# Patient Record
Sex: Female | Born: 1945 | Race: White | Hispanic: No | Marital: Married | Smoking: Never smoker
Health system: Southern US, Community
[De-identification: ages and names within clinical notes are randomized; demographics above are authoritative.]

## PROBLEM LIST (undated history)

## (undated) DIAGNOSIS — C4491 Basal cell carcinoma of skin, unspecified: Secondary | ICD-10-CM

## (undated) DIAGNOSIS — IMO0002 Reserved for concepts with insufficient information to code with codable children: Secondary | ICD-10-CM

## (undated) DIAGNOSIS — I10 Essential (primary) hypertension: Secondary | ICD-10-CM

## (undated) DIAGNOSIS — M8580 Other specified disorders of bone density and structure, unspecified site: Secondary | ICD-10-CM

## (undated) DIAGNOSIS — F32A Depression, unspecified: Secondary | ICD-10-CM

## (undated) DIAGNOSIS — M1990 Unspecified osteoarthritis, unspecified site: Secondary | ICD-10-CM

## (undated) DIAGNOSIS — F329 Major depressive disorder, single episode, unspecified: Secondary | ICD-10-CM

## (undated) HISTORY — DX: Unspecified osteoarthritis, unspecified site: M19.90

## (undated) HISTORY — DX: Depression, unspecified: F32.A

## (undated) HISTORY — DX: Basal cell carcinoma of skin, unspecified: C44.91

## (undated) HISTORY — PX: BREAST BIOPSY: SHX20

## (undated) HISTORY — DX: Other specified disorders of bone density and structure, unspecified site: M85.80

## (undated) HISTORY — DX: Major depressive disorder, single episode, unspecified: F32.9

## (undated) HISTORY — PX: URETHRAL DILATION: SUR417

## (undated) HISTORY — PX: ABDOMINAL HYSTERECTOMY: SHX81

## (undated) HISTORY — DX: Reserved for concepts with insufficient information to code with codable children: IMO0002

## (undated) HISTORY — PX: OTHER SURGICAL HISTORY: SHX169

---

## 1998-04-22 ENCOUNTER — Other Ambulatory Visit: Admission: RE | Admit: 1998-04-22 | Discharge: 1998-04-22 | Payer: Self-pay | Admitting: *Deleted

## 1999-07-01 ENCOUNTER — Encounter: Admission: RE | Admit: 1999-07-01 | Discharge: 1999-07-01 | Payer: Self-pay | Admitting: Internal Medicine

## 1999-07-01 ENCOUNTER — Encounter: Payer: Self-pay | Admitting: Internal Medicine

## 2001-02-23 ENCOUNTER — Other Ambulatory Visit: Admission: RE | Admit: 2001-02-23 | Discharge: 2001-02-23 | Payer: Self-pay | Admitting: *Deleted

## 2002-02-28 HISTORY — PX: OTHER SURGICAL HISTORY: SHX169

## 2002-11-14 ENCOUNTER — Ambulatory Visit (HOSPITAL_BASED_OUTPATIENT_CLINIC_OR_DEPARTMENT_OTHER): Admission: RE | Admit: 2002-11-14 | Discharge: 2002-11-15 | Payer: Self-pay | Admitting: Orthopedic Surgery

## 2006-05-26 ENCOUNTER — Other Ambulatory Visit: Admission: RE | Admit: 2006-05-26 | Discharge: 2006-05-26 | Payer: Self-pay | Admitting: *Deleted

## 2007-11-23 ENCOUNTER — Encounter: Admission: RE | Admit: 2007-11-23 | Discharge: 2007-11-23 | Payer: Self-pay | Admitting: Family Medicine

## 2008-04-22 ENCOUNTER — Encounter: Admission: RE | Admit: 2008-04-22 | Discharge: 2008-04-22 | Payer: Self-pay | Admitting: Family Medicine

## 2008-05-26 ENCOUNTER — Other Ambulatory Visit: Admission: RE | Admit: 2008-05-26 | Discharge: 2008-05-26 | Payer: Self-pay | Admitting: Family Medicine

## 2008-05-26 ENCOUNTER — Encounter: Payer: Self-pay | Admitting: Internal Medicine

## 2009-07-28 ENCOUNTER — Ambulatory Visit: Payer: Self-pay | Admitting: Internal Medicine

## 2009-07-28 DIAGNOSIS — F329 Major depressive disorder, single episode, unspecified: Secondary | ICD-10-CM | POA: Insufficient documentation

## 2009-07-28 DIAGNOSIS — M549 Dorsalgia, unspecified: Secondary | ICD-10-CM | POA: Insufficient documentation

## 2009-07-28 DIAGNOSIS — M810 Age-related osteoporosis without current pathological fracture: Secondary | ICD-10-CM

## 2009-07-28 DIAGNOSIS — R319 Hematuria, unspecified: Secondary | ICD-10-CM | POA: Insufficient documentation

## 2009-07-28 LAB — CONVERTED CEMR LAB
ALT: 13 units/L (ref 0–35)
AST: 17 units/L (ref 0–37)
Alkaline Phosphatase: 67 units/L (ref 39–117)
BUN: 20 mg/dL (ref 6–23)
Basophils Absolute: 0 10*3/uL (ref 0.0–0.1)
Basophils Relative: 0.5 % (ref 0.0–3.0)
Bilirubin, Direct: 0.1 mg/dL (ref 0.0–0.3)
Cholesterol: 199 mg/dL (ref 0–200)
Eosinophils Absolute: 0.1 10*3/uL (ref 0.0–0.7)
GFR calc non Af Amer: 94.06 mL/min (ref >60)
MCHC: 34 g/dL (ref 30.0–36.0)
MCV: 94.3 fL (ref 78.0–100.0)
Monocytes Absolute: 0.5 10*3/uL (ref 0.1–1.0)
Neutrophils Relative %: 55.7 % (ref 43.0–77.0)
Platelets: 256 10*3/uL (ref 150.0–400.0)
Potassium: 4.1 meq/L (ref 3.5–5.1)
RBC: 3.99 M/uL (ref 3.87–5.11)
RDW: 14 % (ref 11.5–14.6)
Rhuematoid fact SerPl-aCnc: 26.1 intl units/mL — ABNORMAL HIGH (ref 0.0–20.0)
Sed Rate: 15 mm/hr (ref 0–22)
Total Bilirubin: 0.6 mg/dL (ref 0.3–1.2)
VLDL: 12.6 mg/dL (ref 0.0–40.0)

## 2009-07-29 ENCOUNTER — Encounter: Payer: Self-pay | Admitting: Internal Medicine

## 2009-07-29 LAB — CONVERTED CEMR LAB
Calcium, Total (PTH): 9.2 mg/dL (ref 8.4–10.5)
PTH: 13 pg/mL — ABNORMAL LOW (ref 14.0–72.0)

## 2009-08-02 DIAGNOSIS — R32 Unspecified urinary incontinence: Secondary | ICD-10-CM | POA: Insufficient documentation

## 2009-08-10 ENCOUNTER — Ambulatory Visit: Payer: Self-pay | Admitting: Internal Medicine

## 2009-08-10 ENCOUNTER — Encounter: Payer: Self-pay | Admitting: Internal Medicine

## 2009-09-04 ENCOUNTER — Encounter: Admission: RE | Admit: 2009-09-04 | Discharge: 2009-09-04 | Payer: Self-pay | Admitting: Internal Medicine

## 2009-09-08 ENCOUNTER — Ambulatory Visit: Payer: Self-pay | Admitting: Internal Medicine

## 2009-09-08 DIAGNOSIS — R030 Elevated blood-pressure reading, without diagnosis of hypertension: Secondary | ICD-10-CM

## 2009-09-10 ENCOUNTER — Encounter: Admission: RE | Admit: 2009-09-10 | Discharge: 2009-09-10 | Payer: Self-pay | Admitting: Internal Medicine

## 2009-09-22 ENCOUNTER — Encounter: Payer: Self-pay | Admitting: Internal Medicine

## 2009-12-08 ENCOUNTER — Ambulatory Visit: Payer: Self-pay | Admitting: Internal Medicine

## 2009-12-08 DIAGNOSIS — M1990 Unspecified osteoarthritis, unspecified site: Secondary | ICD-10-CM | POA: Insufficient documentation

## 2010-01-14 ENCOUNTER — Ambulatory Visit: Payer: Self-pay | Admitting: Family Medicine

## 2010-01-14 ENCOUNTER — Encounter: Payer: Self-pay | Admitting: Family Medicine

## 2010-03-30 NOTE — Assessment & Plan Note (Signed)
Summary: anxiety/dm   Vital Signs:  Patient profile:   65 year old female Menstrual status:  hysterectomy Weight:      134 pounds Temp:     97.4 degrees F oral BP sitting:   156 / 80  (left arm) Cuff size:   regular  Vitals Entered By: Sid Falcon LPN (January 14, 2010 11:59 AM)  History of Present Illness: Patient seen as a work in with episode last night of sensation of heart fluttering. Her pulse was 96. She initially did not have any chest discomfort, more a sensation of not being able to get a deep breath. She felt very anxious. She got up to walk and her symptoms improved. She did not have any chest tightness or any real chest pain. Patient subsequently fell asleep. After about 3 hours she woke and had similar recurrent symptoms. Again no chest pain and sensation of chest fluttering very briefly.  Used some of her husbands viscous lidocaine for mouth ulcer and wondered if this may have precipitated (fluttering sensation).  Patient states she had similar episodes in the past and has had prior EKG which was unremarkable. She does drink about 5 cups of caffeinated beverage per day. Walks for exercise with no problems TSH last May normal.  She is nonsmoker.  No FH of premature CAD,  No diabetes hx.  Good HDL.  Dealing with multiple stressors with her husband's diagnosis of cancer and some other family stressors.  Allergies: 1)  ! Penicillin 2)  ! * Ivp Dye  Past History:  Past Medical History: Last updated: 12/08/2009 Depression-in the past cystocele  Idiopathic hematuria . G3 P4 twins  Urinary incontinence Basal cell ca  nose Osteopenia  dexa 2009 ,-2.3   osteoporosis  -2.5 hip 2011 Colonscopy 2007 nl  PAP 2010 nl Osteoarthritis  Past Surgical History: Last updated: 09-10-2009 Bladder tack  urethral dilitation Rt Shoulder Surgery  [facility]  2004 Hysterectomy 1982  fibroids  and cyst right ovary  left oopherectomy Basal cell Ca Lomax  Family History: Last  updated: 09/10/09 Father: Deceased from Bladder cancer-age 43 Mother: Deceased- Major depressive disorder- pulmonary embolism Siblings: 4 Brothers- Oldest- Adult onset- DM, high cholesterol, Brother- Rheumatoid Arthritis, Twin brothers- one has DJD in spine; other- healthy  Mom  fractured hip in 23s and meds    Social History: Last updated: Sep 10, 2009 Retired  Scientist, [employer]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  NHL in remission Never Smoked Alcohol use-yes Drug use-no Regular exercise-yes hh of 2    no pets.  Multiple myeloma and non hogkins  lymphoma  in husband .  x 6 years .     Risk Factors: Alcohol Use: <1 (12/08/2009) Caffeine Use: 2 (12/08/2009) Exercise: yes (12/08/2009)  Risk Factors: Smoking Status: never (12/08/2009)  Review of Systems  The patient denies anorexia, fever, weight loss, hoarseness, chest pain, syncope, dyspnea on exertion, peripheral edema, prolonged cough, headaches, hemoptysis, abdominal pain, melena, hematochezia, and severe indigestion/heartburn.    Physical Exam  General:  Well-developed,well-nourished,in no acute distress; alert,appropriate and cooperative throughout examination Ears:  External ear exam shows no significant lesions or deformities.  Otoscopic examination reveals clear canals, tympanic membranes are intact bilaterally without bulging, retraction, inflammation or discharge. Hearing is grossly normal bilaterally. Mouth:  patient has small aphthous ulcer right posterior hard palate area. Otherwise normal Neck:  No deformities, masses, or tenderness noted. Lungs:  Normal  respiratory effort, chest expands symmetrically. Lungs are clear to auscultation, no crackles or wheezes. Heart:  normal rate and regular rhythm.   Cervical Nodes:  No lymphadenopathy noted   Impression & Recommendations:  Problem # 1:  PALPITATIONS (ICD-785.1)  patient had transient symptoms of  air hunger which are probably anxiety related. Obtain EKG. Gradually reduce caffeine use. EKG NSR with no acute changes.  Orders: EKG w/ Interpretation (93000)  Problem # 2:  APHTHOUS ULCERS (ICD-528.2) treat with salt water gargles.  Problem # 3:  ELEVATED BP READING WITHOUT DX HYPERTENSION (ICD-796.2) She is encouraged to follow up with Dr Fabian Sharp regarding this.  Complete Medication List: 1)  Calcium 600+d 600-400 Mg-unit Tabs (Calcium carbonate-vitamin d) 2)  Alendronate Sodium 70 Mg Tabs (Alendronate sodium) .Marland Kitchen.. 1 by mouth q week  Patient Instructions: 1)  Gradually reduce caffeine use. 2)  Avoid further viscous lidocaine use 3)  Follow up promptly if you have any recurrent chest tightness, especially with exercise   Orders Added: 1)  Est. Patient Level IV [25366] 2)  EKG w/ Interpretation [93000]

## 2010-03-30 NOTE — Consult Note (Signed)
Summary: [facility]   Imported By: Maryln Gottron 10/02/2009 12:52:34  _____________________________________________________________________  External Attachment:    Type:   Image     Comment:   External Document

## 2010-03-30 NOTE — Letter (Signed)
Summary: Additional Records from [facility]  Additional Records from [facility]   Imported By: Maryln Gottron 10/07/2009 10:01:48  _____________________________________________________________________  External Attachment:    Type:   Image     Comment:   External Document

## 2010-03-30 NOTE — Miscellaneous (Signed)
Summary: BONE DENSITY  Clinical Lists Changes  Orders: Added new Test order of T-Bone Densitometry (77080) - Signed Added new Test order of T-Lumbar Vertebral Assessment (77082) - Signed 

## 2010-03-30 NOTE — Assessment & Plan Note (Signed)
Summary: F/U POST RHEUMATOLOGY OV // RS   Vital Signs:  Patient profile:   65 year old female Menstrual status:  hysterectomy Weight:      135 pounds Pulse rate:   88 / minute BP sitting:   156 / 80  (left arm) Cuff size:   regular  Vitals Entered By: Romualdo Bolk, CMA (AAMA) (December 08, 2009 2:17 PM) CC: 1)Pt wants to discuss rheumatology appt. Dr. Dareen Piano recommending fosamax and naproxen. 2)Left ear is pops and cracks. Then in am she has to wiggle it to get it to open.   History of Present Illness: Nicole Wood comes in today for follow-up after her visit to the rheumatologist. She was evaluated in felt to have changes of osteoarthritis in not in inflammatory arthritis. However it was recommended she should consider a Fosamax type medication because of her bone density in a -2.5 range.  She does have hesitations with these medications. She has no personal history of fracture is physically active however her mom did have a history of fracture but had many other risk factors.   She also has a problem with waxen her left ear today difficult to hear and crackling there is no pain or fever..  Preventive Screening-Counseling & Management  Alcohol-Tobacco     Alcohol drinks/day: <1     Alcohol type: wine     Smoking Status: never  Caffeine-Diet-Exercise     Caffeine use/day: 2     Does Patient Exercise: yes     Type of exercise: walking, bike     Exercise (avg: min/session): 30-60     Times/week: 3  Current Medications (verified): 1)  Calcium 600+d 600-400 Mg-Unit Tabs (Calcium Carbonate-Vitamin D)  Allergies (verified): 1)  ! Penicillin 2)  ! * Ivp Dye  Past History:  Past medical, surgical, family and social histories (including risk factors) reviewed for relevance to current acute and chronic problems.  Past Medical History: Depression-in the past cystocele  Idiopathic hematuria . G3 P4 twins  Urinary incontinence Basal cell ca  nose Osteopenia  dexa 2009  ,-2.3   osteoporosis  -2.5 hip 2011 Colonscopy 2007 nl  PAP 2010 nl Osteoarthritis  Past Surgical History: Reviewed history from 09/08/2009 and no changes required. Bladder tack  urethral dilitation Rt Shoulder Surgery  [facility]  2004 Hysterectomy 1982  fibroids  and cyst right ovary  left oopherectomy Basal cell Ca Lomax  Past History:  Care Management: Urology: Mosetta Pigeon  Prev pcp Daphine Deutscher , [facility] Rheumatology: Dareen Piano  Family History: Reviewed history from 09/08/2009 and no changes required. Father: Deceased from Bladder cancer-age 53 Mother: Deceased- Major depressive disorder- pulmonary embolism Siblings: 38 Brothers- Oldest- Adult onset- DM, high cholesterol, Brother- Rheumatoid Arthritis, Twin brothers- one has DJD in spine; other- healthy  Mom  fractured hip in 40s and meds    Social History: Reviewed history from 09/08/2009 and no changes required. 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  NHL in remission Never Smoked Alcohol use-yes Drug use-no Regular exercise-yes hh of 2    no pets.  Multiple myeloma and non hogkins  lymphoma  in husband .  x 6 years .     Review of Systems  The patient denies anorexia, fever, weight loss, weight gain, vision loss, hoarseness, chest pain, prolonged cough, abdominal pain, difficulty walking, abnormal bleeding, enlarged lymph nodes, and angioedema.  Physical Exam  General:  Well-developed,well-nourished,in no acute distress; alert,appropriate and cooperative throughout examination Head:  normocephalic and atraumatic.   Eyes:  vision grossly intact.   Ears:  R ear normal.  left eac  full of wax   no redness after irrigation TM intact and normal Nose:  no external deformity and no nasal discharge.   Mouth:  pharynx pink and moist.   Neck:  No deformities, masses, or tenderness noted. Psych:  Oriented X3, good eye contact, not anxious  appearing, and not depressed appearing.   reviewed office note from Dr. Dareen Piano and his report on her bone density.  Impression & Recommendations:  Problem # 1:  OSTEOPOROSIS DEXA (ICD-733.00) reviewd dexa and note from Dr Dareen Piano.    counseled about risk and Discussed risk benefit  of bisphosphonates .  will give her a prescription for such in case you decide she wishes to start reviewed medicine. Acknowledge her concerns  primary prevention. Her updated medication list for this problem includes:    Calcium 600+d 600-400 Mg-unit Tabs (Calcium carbonate-vitamin d)    Alendronate Sodium 70 Mg Tabs (Alendronate sodium) .Marland Kitchen... 1 by mouth q week  Problem # 2:  WAX IN EARS (ICD-380.4) left   better after irrigation and no pathology seen.   Problem # 3:  ELEVATED BP READING WITHOUT DX HYPERTENSION (ICD-796.2) Assessment: Unchanged elevated today but has checked elsewhere and normal.  Problem # 4:  DEGENERATIVE JOINT DISEASE (ICD-715.90) carae with naproxyns as neeed  Complete Medication List: 1)  Calcium 600+d 600-400 Mg-unit Tabs (Calcium carbonate-vitamin d) 2)  Alendronate Sodium 70 Mg Tabs (Alendronate sodium) .Marland Kitchen.. 1 by mouth q week  Other Orders: Admin 1st Vaccine (04540) Flu Vaccine 43yrs + (98119)  Patient Instructions: 1)  your risk of fracture by stats   17 % 10 year risk.  and 1.7% for hip 2)  consider fosamax  and  continue weight bearing exercise . 3)  Repeat dexa in 2 years or prn Prescriptions: ALENDRONATE SODIUM 70 MG TABS (ALENDRONATE SODIUM) 1 by mouth q week  #4 x 12   Entered and Authorized by:   Madelin Headings MD   Signed by:   Madelin Headings MD on 12/08/2009   Method used:   Print then Give to Patient   RxID:   [id number]  greater than 50% of visit spent in counseling  25 mintues   Flu Vaccine Consent Questions     Do you have a history of severe allergic reactions to this vaccine? no    Any prior history of allergic reactions to egg and/or gelatin?  no    Do you have a sensitivity to the preservative Thimersol? no    Do you have a past history of Guillan-Barre Syndrome? no    Do you currently have an acute febrile illness? no    Have you ever had a severe reaction to latex? no    Vaccine information given and explained to patient? yes    Are you currently pregnant? no    Lot Number:[id number]   Exp Date:08/28/2010   Site Given  Left Deltoid IMu1 Romualdo Bolk, CMA (AAMA)  December 08, 2009 2:20 PM greater than 50% of visit spent in counseling  25 minutes

## 2010-03-30 NOTE — Letter (Signed)
Summary: Records from [facility] 2009 - 2010  Records from [facility] 2009 - 2010   Imported By: Maryln Gottron 08/06/2009 15:50:57  _____________________________________________________________________  External Attachment:    Type:   Image     Comment:   External Document

## 2010-03-30 NOTE — Assessment & Plan Note (Signed)
Summary: CPX/RCD   Vital Signs:  Patient profile:   65 year old female Menstrual status:  hysterectomy Height:      64.5 inches Weight:      130 pounds Pulse rate:   80 / minute BP sitting:   160 / 82  (right arm) Cuff size:   regular  Vitals Entered By: Romualdo Bolk, CMA (AAMA) (September 08, 2009 1:33 PM)  Serial Vital Signs/Assessments:  Time      Position  BP       Pulse  Resp  Temp     By           R Arm     148/80                         Madelin Headings MD           L Arm     148/78                         Madelin Headings MD  Comments: reg cuff sitting By: Madelin Headings MD   CC: CPX- no pap- Pt has had a hyst.    History of Present Illness: Nicole Wood comes in today  for preventive visit . Since last visit  here  there have been no major changes in health status  .  Has call back on mammo gram  and related this to her elevated BP today. Bones : calcium vitd in foods  . wants to avoid meds  was on HRT in the past . No  hot flushes sig now.  Has  records from Dr Alycia Rossetti al [facility].  Back   pain stiffness no change .  NO fever or redness   Preventive Care Screening  Prior Values:    Mammogram:  mammograms for comparison - BI-RADS 0^MM DIGITAL SCREENING (09/04/2009)    Colonoscopy:  normal (02/28/2002)    Bone Density:  abnormal (06/21/2007)    Last Tetanus Booster:  Historical (02/29/2008)    Dexa Interp:  abnormal (06/21/2007)   Preventive Screening-Counseling & Management  Alcohol-Tobacco     Alcohol drinks/day: <1     Alcohol type: wine     Smoking Status: never  Caffeine-Diet-Exercise     Caffeine use/day: 2     Does Patient Exercise: yes     Type of exercise: walking, bike     Exercise (avg: min/session): 30-60     Times/week: 3  Hep-HIV-STD-Contraception     Dental Visit-last 6 months yes  Safety-Violence-Falls     Seat Belt Use: yes     Smoke Detectors: yes      Drug Use:  no.        Blood Transfusions:  yes, prior to  1987, and 1968.    Current Medications (verified): 1)  Calcium 600+d 600-400 Mg-Unit Tabs (Calcium Carbonate-Vitamin D)  Allergies (verified): 1)  ! Penicillin 2)  ! * Ivp Dye  Past History:  Past medical, surgical, family and social histories (including risk factors) reviewed, and no changes noted (except as noted below).  Past Medical History: Depression-in the past cystocele  Idiopathic hematuria . G3 P4 twins  Urinary incontinence Basal cell ca  nose Osteopenia  dexa 2009 ,-2.3   osteoporosis  -2.5 hip 2011 Colonscopy 2007 nl  PAP 2010 nl  Past Surgical History: Bladder tack  urethral dilitation Rt Shoulder Surgery  [facility]  2004 Hysterectomy 1982  fibroids  and cyst right ovary  left oopherectomy Basal cell Ca Lomax  Past History:  Care Management: Urology: Mosetta Pigeon  Prev pcp martin , [facility]app  Family History: Reviewed history from 07/28/2009 and no changes required. Father: Deceased from Bladder cancer-age 62 Mother: Deceased- Major depressive disorder- pulmonary embolism Siblings: 47 Brothers- Oldest- Adult onset- DM, high cholesterol, Brother- Rheumatoid Arthritis, Twin brothers- one has DJD in spine; other- healthy  Mom  fractured hip in 77s and meds    Social History: Reviewed history from 07/28/2009 and no changes required. Retired  Scientist, [employer] college  Married    4 children   [place] , [place] and [place] and GC>    Husband cancer MM.  NHL in remission Never Smoked Alcohol use-yes Drug use-no Regular exercise-yes hh of 2    no pets.  Multiple myeloma and non hogkins  lymphoma  in husband .  x 6 years .   Dental Care w/in 6 mos.:  yes  Review of Systems  The patient denies anorexia, fever, weight loss, weight gain, vision loss, hoarseness, chest pain, syncope, dyspnea on exertion, peripheral edema, prolonged cough, abdominal pain, melena, hematochezia, severe indigestion/heartburn, incontinence, muscle weakness, suspicious skin lesions,  transient blindness, difficulty walking, abnormal bleeding, enlarged lymph nodes, angioedema, and breast masses.         ankle sprain   this year no fracture  and fell down steps  evaluated for microscopic hematuria..no gyne problems  followed by dr Isabel Caprice for uro  Physical Exam  General:  Well-developed,well-nourished,in no acute distress; alert,appropriate and cooperative throughout examination Head:  normocephalic and atraumatic.   Eyes:  vision grossly intact, pupils equal, pupils round, and pupils reactive to light.   Ears:  R ear normal, L ear normal, and no external deformities.   Nose:  no external deformity, nose piercing noted, and no external erythema.   Mouth:  good dentition and pharynx pink and moist.   Neck:  No deformities, masses, or tenderness noted. Chest Wall:  No deformities, masses, or tenderness noted. Breasts:  No mass, nodules, thickening, tenderness, bulging, retraction, inflamation, nipple discharge or skin changes noted.   lumpy  Lungs:  Normal respiratory effort, chest expands symmetrically. Lungs are clear to auscultation, no crackles or wheezes. Heart:  Normal rate and regular rhythm. S1 and S2 normal without gallop, murmur, click, rub or other extra sounds. Abdomen:  Bowel sounds positive,abdomen soft and non-tender without masses, organomegaly or hernias noted. Msk:  no joint swelling, no joint warmth, and no redness over joints.   Pulses:  pulses intact without delay   Extremities:  no clubbing cyanosis or edema  Neurologic:  alert & oriented X3, cranial nerves II-XII intact, strength normal in all extremities, gait normal, and DTRs symmetrical and normal.   Skin:  turgor normal, color normal, no suspicious lesions, no ecchymoses, and no petechiae.   Cervical Nodes:  No lymphadenopathy noted Axillary Nodes:  No palpable lymphadenopathy Inguinal Nodes:  No significant adenopathy Psych:  Oriented X3, good eye contact, not anxious appearing, and not depressed  appearing.   old records reviewed   EKG NSR   Impression & Recommendations:  Problem # 1:  PREVENTIVE HEALTH CARE (ICD-V70.0) counseled about healthy diet exercise etc.    Injury prevention  get colonsocopy when due.     Orders: EKG w/ Interpretation (93000)  Problem # 2:  OSTEOPOROSIS DEXA (ICD-733.00) disc options  now is osteoporosis      range  at  hip   declines med to day and to continue with lifestyle intervention  Her updated medication list for this problem includes:    Calcium 600+d 600-400 Mg-unit Tabs (Calcium carbonate-vitamin d)  Problem # 3:  ELEVATED BP READING WITHOUT DX HYPERTENSION (ICD-796.2) prob transient .  check at home.   Problem # 4:  BACK PAIN (ICD-724.5) family hx   neg inflammatory markers  but arthralgias .  and other joint stiffness   family hx of autoimmune arthritis  Disc options   will refer for opinion.  Orders: Rheumatology Referral (Rheumatology)  Problem # 5:  HEMATURIA UNSPECIFIED (ICD-599.70) evaluated and followed by Dr Isabel Caprice.  Complete Medication List: 1)  Calcium 600+d 600-400 Mg-unit Tabs (Calcium carbonate-vitamin d)  Patient Instructions: 1)  consider medication for your bones   2)  repeat  DEXa in 24 months .  3)  Will do a RHeumatology referral for your joint  pains  and elevated RF.  4)  check BP readings  and call if consistently elevated .  5)  Otherwise cpx in a year or prn

## 2010-03-30 NOTE — Assessment & Plan Note (Signed)
Summary: NEW PT EST // RS   Vital Signs:  Patient profile:   65 year old female Menstrual status:  hysterectomy Height:      64.5 inches Weight:      130 pounds BMI:     22.05 Pulse rate:   66 / minute BP sitting:   110 / 60  (right arm) Cuff size:   regular  Vitals Entered By: Romualdo Bolk, CMA (AAMA) (Jul 28, 2009 10:06 AM) CC: New Pt to establish- Pt is concerned about a vaginal cystocele- seeing Dr. Isabel Caprice due to [facility] in urine. ; Pt under alot of stress due to husband having 2 types of cancer. Pt is dealing with stress by exercise and metiation. Pt under the of a nutrionist. Pt is fasting for labs., Pt states that occ. she does have times where her heart will skip a beat but relates that to stress no dizziness or passing out with this. Pt does have extremely bone density dx with osteopenia. Pt is also having back pain but may be back stiffiness. Pt has crunchy feelings in her fingers.     Menstrual Status hysterectomy   History of Present Illness: Tasheika Kitzmiller comes in today  for a new patient visit with a list of concerns. See above .  Previous PCP [facility] physicians  Dr Daphine Deutscher  in past and then others .    Ended up seeing NP and changed PCPs.   Husband sees Dr Tawanna Cooler.  Last check up April in 09.  brings in lab reports from then No fractures but low BD in 2009 . Generally healthy  She is under a lot of stress from family siutation.   Husband is a 6 yr cancer survivor  Multiple myeloma.   Co of stiffness in fingers in am and some in back but no swelling or redness.   Preventive Care Screening  Mammogram:    Date:  04/01/2008    Results:  normal   Last Tetanus Booster:    Date:  02/29/2008    Results:  Historical   Bone Density:    Date:  06/21/2007    Results:  abnormal std dev  Colonoscopy:    Date:  02/28/2002    Results:  normal    Preventive Screening-Counseling & Management  Alcohol-Tobacco     Alcohol drinks/day: <1     Alcohol type: wine     Smoking  Status: never  Caffeine-Diet-Exercise     Caffeine use/day: 2     Does Patient Exercise: yes     Type of exercise: walking, bike     Exercise (avg: min/session): 30-60     Times/week: 3  Safety-Violence-Falls     Seat Belt Use: yes     Smoke Detectors: yes      Drug Use:  no.        Blood Transfusions:  yes, prior to 1987, and 1968.    Current Medications (verified): 1)  Calcium 600+d 600-400 Mg-Unit Tabs (Calcium Carbonate-Vitamin D)  Allergies (verified): 1)  ! Penicillin 2)  ! * Ivp Dye  Past History:  Past Medical History: Depression-in the past cystocele  Idiopathic hematuria . G3 P4 twins  Urinary incontinence  Past Surgical History: Bladder tack Rt Shoulder Surgery  [place]  Hysterectomy 1982   Past History:  Care Management: Urology: Isabel Caprice  Family History: Father: Deceased from Bladder cancer-age 70 Mother: Deceased- Major depressive disorder- pulmonary embolism Siblings: 4 Brothers- Oldest- Adult onset- DM, high cholesterol, Brother- Rheumatoid Arthritis, Twin  brothers- one has DJD in spine; other- healthy  Social History: Retired  some college  Married    4 children   [place] and [place]>    Husband cancer MM. Never Smoked Alcohol use-yes Drug use-no Regular exercise-yes hh of 2    no pets.  Multiple myeloma and non hogkins  lymphoma  in husband .  x 6 years .   Smoking Status:  never Caffeine use/day:  2 Does Patient Exercise:  yes Drug Use:  no Blood Transfusions:  yes, prior to 1987, 1968 Seat Belt Use:  yes  Review of Systems  The patient denies anorexia, fever, weight loss, weight gain, vision loss, decreased hearing, hoarseness, chest pain, dyspnea on exertion, prolonged cough, abdominal pain, melena, hematochezia, severe indigestion/heartburn, muscle weakness, abnormal bleeding, enlarged lymph nodes, and angioedema.         left shoulder  problematic   bone spur.   ocass skipped heart beat but ok when  exercise.  no problems.  Physical Exam  General:  Well-developed,well-nourished,in no acute distress; alert,appropriate and cooperative throughout examination Head:  normocephalic and atraumatic.   Eyes:  vision grossly intact, pupils equal, and pupils round.   Ears:  R ear normal, L ear normal, and no external deformities.   Neck:  No deformities, masses, or tenderness noted. Lungs:  Normal respiratory effort, chest expands symmetrically. Lungs are clear to auscultation, no crackles or wheezes. Heart:  Normal rate and regular rhythm. S1 and S2 normal without gallop, murmur, click, rub or other extra sounds. Abdomen:  Bowel sounds positive,abdomen soft and non-tender without masses, organomegaly or  noted. Msk:  no joint swelling and no joint warmth.   Pulses:  pulses intact without delay   Extremities:  no clubbing cyanosis or edema  Neurologic:  alert & oriented X3, strength normal in all extremities, and gait normal.  non f ocal  Skin:  turgor normal, color normal, no ecchymoses, and no petechiae.   Cervical Nodes:  No lymphadenopathy noted Psych:  Oriented X3 and normally interactive.   Labs and records pt brought reviewed at  isit  low vit d 2009  dexa osteopenia hip 2009  Impression & Recommendations:  Problem # 1:  OSTEOPENIA (ICD-733.90)  Her updated medication list for this problem includes:    Calcium 600+d 600-400 Mg-unit Tabs (Calcium carbonate-vitamin d)  Problem # 2:  FAMILY STRESS (ICD-V61.9) counseled   Problem # 3:  HEMATURIA UNSPECIFIED (ICD-599.70) prev evaluated    idopathic  probably by hx   Problem # 4:  BACK PAIN (ICD-724.5) am  joint stiffness without a lot of physical findings  Orders: TLB-CRP-High Sensitivity (C-Reactive Protein) (86140-FCRP) T-Antinuclear Antib (ANA) (16109-60454) TLB-Rheumatoid Factor (RA) (09811-BJ) TLB-Sedimentation Rate (ESR) (85652-ESR)  Problem # 5:  DEPRESSION (ICD-311)  hx  no rx and stable currently   Complete  Medication List: 1)  Calcium 600+d 600-400 Mg-unit Tabs (Calcium carbonate-vitamin d)  Other Orders: TLB-TSH (Thyroid Stimulating Hormone) (84443-TSH) TLB-Hepatic/Liver Function Pnl (80076-HEPATIC) TLB-CBC Platelet - w/Differential (85025-CBCD) TLB-BMP (Basic Metabolic Panel-BMET) (80048-METABOL) TLB-Lipid Panel (80061-LIPID) T-Parathyroid Hormone, Intact w/ Calcium (47829-56213) Venipuncture (08657)  Patient Instructions: 1)  Get Dexa  scan . 2)   then  CPX in  about a month ( can make a slot not on a thursday)

## 2010-04-01 LAB — HM DIABETES EYE EXAM

## 2010-07-16 NOTE — Op Note (Signed)
NAME:  Nicole Wood, Nicole Wood [facility]                        ACCOUNT NO.:  [id number]   MEDICAL RECORD NO.:  [id number]                   PATIENT TYPE:  AMB   LOCATION:  DSC                                  FACILITY:  [facility]   PHYSICIAN:  Loreta Ave, M.D.              DATE OF BIRTH:  06/21/1945   DATE OF PROCEDURE:  11/14/2002  DATE OF DISCHARGE:                                 OPERATIVE REPORT   PREOPERATIVE DIAGNOSES:  1. Adhesive capsulitis, right shoulder.  2. Subacromial impingement with degenerative joint disease,     acromioclavicular joint.  3. Partial tearing, rotator cuff.   POSTOPERATIVE DIAGNOSES:  1. Adhesive capsulitis, right shoulder.  2. Subacromial impingement with degenerative joint disease,     acromioclavicular joint.  3. Partial tearing, rotator cuff.   OPERATIVE PROCEDURE:  1. Right shoulder examination under anesthesia, arthroscopy, manipulation of     shoulder.  2. Arthroscopic lysis and debridement of intra-articular and extra-articular     adhesions.  3. Debridement of rotator cuff above and below.  4. Acromioplasty.  5. Release coracoacromial ligament.  6. Excision, distal clavicle.   SURGEON:  Loreta Ave, M.D.   ASSISTANT:  Arlys John D. Petrarca, P.A.-C.   ANESTHESIA:  General.   ESTIMATED BLOOD LOSS:  Minimal.   SPECIMENS:  None.   CULTURES:  None.   COMPLICATIONS:  None.   DRESSING:  Soft compressive with sling.   DESCRIPTION OF PROCEDURE:  Patient brought to the operating room and after  adequate anesthesia had been obtained, right shoulder examined.  Significant  adhesive capsulitis.  Forward flexion in abduction only 60 degrees with very  little rotation.  Manipulated with breaking up of the intra- and extra-  articular adhesions without adverse occurrence.  Achieved full motion with  good stability.  Placed in the beach chair position on the shoulder  positioner, prepped and draped in the usual sterile fashion.  Three standard  arthroscopic portals, anterior, posterior, and lateral.  Shoulder entered  with a blunt obturator, distended, and inspected.  Residual bleeding,  synovitis, adhesions all debrided.  Undersurface tearing, rotator cuff  __________ region debrided.  Remaining structures in the shoulder intact.  Cannula redirected subacromially.  Significant bursitis and adhesions all  resected.  Type 2 acromion.  Obvious impingement.  Rotator cuff had abrasive  tearing, no full-thickness tears.  Acromioplasty to a type 1 acromion after  bursectomy and cuff debridement.  Shaver and high-speed bur utilized.  Cautery used to incise the CA ligament to release it.  Distal clavicle grade  4 changes with marked spurs.  Spurs were removed, synovitis removed, and the  lateral centimeter resected with shaver and high-speed bur.  Adequacy of  decompression and clavicle excision as well as thorough assessment of the  cuff made from all portals and decompression was felt to be adequate.  Instruments and fluid removed.  Shoulder re-examined with good motion and  stability.  Portals closed with 4-0 nylon.  Margins of the wound and  shoulder injected with Marcaine.  A sterile compressive dressing applied.  Sling applied.  Anesthesia reversed.  Brought to the recovery room.  Tolerated the surgery well with no complications.                                               Loreta Ave, M.D.    DFM/MEDQ  D:  11/14/2002  T:  11/15/2002  Job:  [id number]

## 2010-08-09 ENCOUNTER — Other Ambulatory Visit: Payer: Self-pay | Admitting: Internal Medicine

## 2010-08-09 DIAGNOSIS — Z1231 Encounter for screening mammogram for malignant neoplasm of breast: Secondary | ICD-10-CM

## 2010-09-06 ENCOUNTER — Ambulatory Visit
Admission: RE | Admit: 2010-09-06 | Discharge: 2010-09-06 | Disposition: A | Discharge status: Home / Self Care | Payer: Medicare Other | Source: Ambulatory Visit | Attending: Internal Medicine | Admitting: Internal Medicine

## 2010-09-06 DIAGNOSIS — Z1231 Encounter for screening mammogram for malignant neoplasm of breast: Secondary | ICD-10-CM

## 2010-12-13 ENCOUNTER — Encounter: Payer: Self-pay | Admitting: Internal Medicine

## 2010-12-14 ENCOUNTER — Ambulatory Visit (INDEPENDENT_AMBULATORY_CARE_PROVIDER_SITE_OTHER): Payer: Medicare Other | Admitting: Internal Medicine

## 2010-12-14 ENCOUNTER — Encounter: Payer: Self-pay | Admitting: Internal Medicine

## 2010-12-14 DIAGNOSIS — Z13220 Encounter for screening for lipoid disorders: Secondary | ICD-10-CM

## 2010-12-14 DIAGNOSIS — R030 Elevated blood-pressure reading, without diagnosis of hypertension: Secondary | ICD-10-CM

## 2010-12-14 DIAGNOSIS — Z23 Encounter for immunization: Secondary | ICD-10-CM

## 2010-12-14 DIAGNOSIS — M949 Disorder of cartilage, unspecified: Secondary | ICD-10-CM

## 2010-12-14 DIAGNOSIS — M1990 Unspecified osteoarthritis, unspecified site: Secondary | ICD-10-CM

## 2010-12-14 DIAGNOSIS — R6884 Jaw pain: Secondary | ICD-10-CM | POA: Insufficient documentation

## 2010-12-14 DIAGNOSIS — R319 Hematuria, unspecified: Secondary | ICD-10-CM

## 2010-12-14 DIAGNOSIS — Z0000 Encounter for general adult medical examination without abnormal findings: Secondary | ICD-10-CM

## 2010-12-14 LAB — CBC WITH DIFFERENTIAL/PLATELET
Basophils Relative: 0.6 % (ref 0.0–3.0)
Eosinophils Relative: 2.3 % (ref 0.0–5.0)
HCT: 38.8 % (ref 36.0–46.0)
Lymphocytes Relative: 24.6 % (ref 12.0–46.0)
MCV: 95.3 fl (ref 78.0–100.0)
Monocytes Relative: 7.5 % (ref 3.0–12.0)
Neutro Abs: 4.5 10*3/uL (ref 1.4–7.7)
Platelets: 278 10*3/uL (ref 150.0–400.0)
RDW: 13.5 % (ref 11.5–14.6)

## 2010-12-14 LAB — BASIC METABOLIC PANEL
CO2: 28 mEq/L (ref 19–32)
Chloride: 106 mEq/L (ref 96–112)
Creatinine, Ser: 0.7 mg/dL (ref 0.4–1.2)
Sodium: 141 mEq/L (ref 135–145)

## 2010-12-14 LAB — HEPATIC FUNCTION PANEL
ALT: 22 U/L (ref 0–35)
AST: 25 U/L (ref 0–37)
Albumin: 4.5 g/dL (ref 3.5–5.2)
Alkaline Phosphatase: 79 U/L (ref 39–117)
Bilirubin, Direct: 0 mg/dL (ref 0.0–0.3)
Total Protein: 7.2 g/dL (ref 6.0–8.3)

## 2010-12-14 LAB — TSH: TSH: 2.67 u[IU]/mL (ref 0.35–5.50)

## 2010-12-14 LAB — LDL CHOLESTEROL, DIRECT: Direct LDL: 137.2 mg/dL

## 2010-12-14 LAB — LIPID PANEL: HDL: 63.1 mg/dL (ref >39.00)

## 2010-12-14 NOTE — Patient Instructions (Addendum)
See dentist   For evaluation.     For the  Jaw pain which sounds like TMJ .  Will notify you  of labs when available. Or if other advice needed regarding bone health.

## 2010-12-14 NOTE — Progress Notes (Signed)
Subjective:    Patient ID: Nicole Wood, female    DOB: December 24, 1945, 65 y.o.   MRN: [id number]  HPI Patient comes in today for Preventive Health Care visit  Welcome to medicare   Concern   Jaw pain and hard to open mouth  Stresses.  ? If  Grits teeth.  Has dentist.    Has had blood in urine and evaluation in past by urology grapey has had uti rx in the recent past  Has seen dr Eulah Pont for hip pain  Does reg exercise  Swim and walks  Osteopenia  No meds at present   Hearing:  Ok   Vision:  No limitations at present . glasses floater   Safety:  Has smoke detector and wears seat belts.  No firearms. No excess sun exposure. Sees dentist regularly.  Falls:  NONE   Advance directive :  Reviewed  Has one.  Memory: Felt to be good  , no concern from her or her family.  Depression: No anhedonia unusual crying or depressive symptoms some stress   Nutrition: Eats well balanced diet; adequate calcium and vitamin D. No swallowing chewiing problems. Except jaw pain  Injury: no major injuries in the last six months.  Other healthcare providers:  Reviewed today .  Social:  Lives with husband married. No pets.   Preventive parameters: up-to-date on colonoscopy, mammogram, immunizations. Including Tdap and pneumovax.  ADLS:   There are no problems or need for assistance  driving, feeding, obtaining food, dressing, toileting and bathing, managing money using phone. She is independent. Last bone density    Review of Systems ROS:  GEN/ HEENTNo fever, significant weight changes sweats headaches vision problems hearing changes, left ear tinnitus CV/ PULM; No chest pain shortness of breath cough, syncope,edema  change in exercise tolerance. GI /GU: No adominal pain, vomiting, change in bowel habits. No blood in the stool. No significant GU symptoms. SKIN/HEME: ,no acute skin rashes suspicious lesions or bleeding. No lymphadenopathy, nodules, masses.  NEURO/ PSYCH:  No neurologic signs such  as weakness numbness hx of  depression anxiety. IMM/ Allergy: No unusual infections.   REST of 12 system review negative   Past history family history social history reviewed in the electronic medical record.      Objective:   Physical Exam Physical Exam: Vital signs reviewed NFA:OZHY is a well-developed well-nourished alert cooperative  white female who appears her stated age in no acute distress.  HEENT: normocephalic  traumatic , Eyes: PERRL EOM's full, conjunctiva clear, Nares: paten,t no deformity discharge or tenderness., Ears: no deformity EAC's clear TMs with normal landmarks. Mouth: clear OP, no lesions, edema.  Moist mucous membranes. Dentition in adequate repair. NECK: supple without masses, thyromegaly or bruits. tmj mild tender no dislocation CHEST/PULM:  Clear to auscultation and percussion breath sounds equal no wheeze , rales or rhonchi. No chest wall deformities or tenderness. CV: PMI is nondisplaced, S1 S2 no gallops, murmurs, rubs. Peripheral pulses are full without delay.No JVD .  Breast: normal by inspection . No dimpling, discharge, masses, tenderness or discharge . LN: no cervical axillary inguinal adenopathy  ABDOMEN: Bowel sounds normal nontender  No guard or rebound, no hepato splenomegaly no CVA tenderness.  No hernia. Extremtities:  No clubbing cyanosis or edema, no acute joint swelling or redness no focal atrophy NEURO:  Oriented x3, cranial nerves 3-12 appear to be intact, no obvious focal weakness,gait within normal limits no abnormal reflexes or asymmetrical  Oriented x 3 and no noted  deficits in memory, attention, and speech.  SKIN: No acute rashes normal turgor, color, no bruising or petechiae. PSYCH: Oriented, good eye contact, no obvious depression anxiety, cognition and judgment appear normal. Hysterectomy and oophorectomy hx  No pap indicated     Assessment & Plan:  Preventive Health Care Counseled regarding healthy nutrition, exercise, sleep,  injury prevention, calcium vit d and healthy weight . Up to date  on healthcare parameters per hx  Had hysterectomy for non cancer reasons Giving pneumovax and  Flu today Osteopenia Jaw pain   Sounds like tmj  No alarm features  conservative management and see dentist  Hematuria  Hx of evaluation per  Urology  Medicare Attestation I have personally reviewed: The patient's medical and social history Their use of alcohol, tobacco or illicit drugs Their current medications and supplements The patient's functional ability including ADLs,fall risks, home safety risks, cognitive, and hearing and visual impairment Diet and physical activities Evidence for depression or mood disorders  The patient's weight, height, BMI, and visual acuity have been recorded in the chart.  I have made referrals, counseling, and provided education to the patient based on review of the above and I have provided the patient with a written personalized care plan for preventive services.

## 2010-12-14 NOTE — Assessment & Plan Note (Signed)
dexa not in record.

## 2010-12-15 LAB — VITAMIN D 25 HYDROXY (VIT D DEFICIENCY, FRACTURES): Vit D, 25-Hydroxy: 31 ng/mL (ref 30–89)

## 2010-12-23 ENCOUNTER — Encounter: Payer: Self-pay | Admitting: *Deleted

## 2011-05-23 DIAGNOSIS — H3530 Unspecified macular degeneration: Secondary | ICD-10-CM | POA: Diagnosis not present

## 2011-07-04 DIAGNOSIS — M654 Radial styloid tenosynovitis [de Quervain]: Secondary | ICD-10-CM | POA: Diagnosis not present

## 2011-07-04 DIAGNOSIS — M25539 Pain in unspecified wrist: Secondary | ICD-10-CM | POA: Diagnosis not present

## 2011-09-22 ENCOUNTER — Other Ambulatory Visit: Payer: Self-pay | Admitting: Internal Medicine

## 2011-09-22 DIAGNOSIS — Z1231 Encounter for screening mammogram for malignant neoplasm of breast: Secondary | ICD-10-CM

## 2011-09-29 DIAGNOSIS — M654 Radial styloid tenosynovitis [de Quervain]: Secondary | ICD-10-CM | POA: Diagnosis not present

## 2011-10-03 ENCOUNTER — Ambulatory Visit: Payer: Medicare Other

## 2011-12-08 ENCOUNTER — Telehealth: Payer: Self-pay | Admitting: Internal Medicine

## 2011-12-08 NOTE — Telephone Encounter (Signed)
Pt would like appt Monday for jaw pain. Can I use sda slot?

## 2011-12-09 NOTE — Telephone Encounter (Signed)
Pt is sch for 12-11-08 1045am

## 2011-12-09 NOTE — Telephone Encounter (Signed)
Ok to do this .  Also  Please block the 2 15 appt as the pt  In the 145 slot needs 30 minutes   For catch up time.  Thanks

## 2011-12-09 NOTE — Telephone Encounter (Signed)
lmom for pt to call back

## 2011-12-12 ENCOUNTER — Ambulatory Visit (INDEPENDENT_AMBULATORY_CARE_PROVIDER_SITE_OTHER): Payer: Medicare Other | Admitting: Internal Medicine

## 2011-12-12 ENCOUNTER — Encounter: Payer: Self-pay | Admitting: Internal Medicine

## 2011-12-12 VITALS — BP 170/94 | HR 91 | Temp 97.7°F | Wt 137.0 lb

## 2011-12-12 DIAGNOSIS — Z733 Stress, not elsewhere classified: Secondary | ICD-10-CM

## 2011-12-12 DIAGNOSIS — I10 Essential (primary) hypertension: Secondary | ICD-10-CM | POA: Diagnosis not present

## 2011-12-12 DIAGNOSIS — R6884 Jaw pain: Secondary | ICD-10-CM | POA: Insufficient documentation

## 2011-12-12 DIAGNOSIS — R002 Palpitations: Secondary | ICD-10-CM | POA: Diagnosis not present

## 2011-12-12 DIAGNOSIS — Z23 Encounter for immunization: Secondary | ICD-10-CM | POA: Diagnosis not present

## 2011-12-12 DIAGNOSIS — F439 Reaction to severe stress, unspecified: Secondary | ICD-10-CM | POA: Insufficient documentation

## 2011-12-12 LAB — CBC WITH DIFFERENTIAL/PLATELET
Basophils Relative: 0.7 % (ref 0.0–3.0)
HCT: 38.2 % (ref 36.0–46.0)
Hemoglobin: 12.5 g/dL (ref 12.0–15.0)
Lymphocytes Relative: 31.3 % (ref 12.0–46.0)
Lymphs Abs: 1.7 10*3/uL (ref 0.7–4.0)
MCHC: 32.7 g/dL (ref 30.0–36.0)
Monocytes Relative: 8.5 % (ref 3.0–12.0)
Neutro Abs: 3.1 10*3/uL (ref 1.4–7.7)
RBC: 4.05 Mil/uL (ref 3.87–5.11)

## 2011-12-12 LAB — BASIC METABOLIC PANEL
CO2: 27 mEq/L (ref 19–32)
Calcium: 9.3 mg/dL (ref 8.4–10.5)
GFR: 79.53 mL/min (ref >60.00)
Potassium: 3.6 mEq/L (ref 3.5–5.1)
Sodium: 142 mEq/L (ref 135–145)

## 2011-12-12 LAB — T4, FREE: Free T4: 0.86 ng/dL (ref 0.60–1.60)

## 2011-12-12 LAB — TSH: TSH: 2.72 u[IU]/mL (ref 0.35–5.50)

## 2011-12-12 LAB — HEPATIC FUNCTION PANEL
AST: 16 U/L (ref 0–37)
Albumin: 4 g/dL (ref 3.5–5.2)
Alkaline Phosphatase: 72 U/L (ref 39–117)
Total Protein: 6.9 g/dL (ref 6.0–8.3)

## 2011-12-12 LAB — LIPID PANEL: Cholesterol: 193 mg/dL (ref 0–200)

## 2011-12-12 MED ORDER — LISINOPRIL 10 MG PO TABS: 10.0000 mg | ORAL_TABLET | Freq: Every day | ORAL | Status: DC

## 2011-12-12 NOTE — Progress Notes (Signed)
Subjective:    Patient ID: Nicole Wood, female    DOB: June 25, 1945, 66 y.o.   MRN: [id number]  HPI Patient comes in today for  for  new problem evaluation. Actually has had a problem for about a year it has become persistent and difficult. She complains of pain in her right jaw someone up near the joint but radiating down into the right lower jaw. It is difficult to eat to open her mouth sometimes swallow it is a chronic pain. She saw her dentist who thought she could have TMJ but was uncertain had some evidence of grinding teeth referred her to Dr. Remonia Richter oral surgeon. She was never seen by then but given medication over the phone that was probably a Rudis and Flexeril she got about 40-60% better and was never offered an appointment. She's also seeing her periodontist who suggested also she see her primary doctor that perhaps or other causes of her pain. She's been taking Advil Aleve-type products continually for almost a year. She gets some mild help with that.  She has lots of stress which be adding to the situation she states occasional palpitations at night no unusual bleeding or GI effects. Denies any trauma to her head although couple years ago did fall sprain her ankle and her jaw but doesn't remember this triggering the problem. Her blood pressure was elevated at or tenderness I cannot get the reading. She is due soon for her laboratory monitoring yearly. Review of Systems Negative for chest pain shortness of breath has occasional palpitations to 3 cups of coffee a day one to 2 wine daily or less. No unusual bruising bleeding falling syncope major changes in vision. She feels that her bite is off no numbness or weakness.  Past history family history social history reviewed in the electronic medical record. Family history of hypertension and stroke. Husband has a history of 2 cancers in remission daughter stressful situation. Outpatient Encounter Prescriptions as of 12/12/2011    Medication Sig Dispense Refill  . calcium-vitamin D (OSCAL WITH D) 500-200 MG-UNIT per tablet Take 1 tablet by mouth daily.        Marland Kitchen ibuprofen (ADVIL,MOTRIN) 200 MG tablet Take 200 mg by mouth every 6 (six) hours as needed.            Objective:   Physical Exam BP 170/94  Pulse 91  Temp 97.7 F (36.5 C) (Oral)  Wt 137 lb (62.143 kg)  SpO2 98% Well-developed well-nourished in no acute distress cognitively intact blood pressure right arm sitting 170/80 left 160/82. HEENT: Normocephalic ;atraumatic , Eyes;  PERRL, EOMs  Full, lids and conjunctiva clear,,Ears: no deformities, canals nl, TM landmarks normal, Nose: no deformity or discharge  Mouth : OP clear without lesion or edema . Some mild reduction in opening her mouth mild discomfort right TMJ area but radiates down to the jaw line. Neck: Supple without adenopathy or masses or bruits Chest:  Clear to A&P without wheezes rales or rhonchi CV:  S1-S2 no gallops or murmurs peripheral perfusion is normal Cor rr No clubbing cyanosis or edema Oriented x 3. Normal cognition, attention, speech. Not  depressed appearing   Good eye contact . Gait no acute findings  Cn 3- 12 seem intact  No weakness     Assessment & Plan:    One year of jaw pain mostly right  probable component of TMJ but uncertain if she could have facial nerve pain. A bit atypical in either way. Discussed options agree  with actually seeing the oral surgeon as far as diagnosis and how much of her pain may be from a TMJ problem versus  Other cause  Consider other medications in the future such as Neurontin or Tegretol as appropriate and perhaps neurologic consult.   Elevated bp  is much higher now does have a family history need rx using nsaid  and stress a component but needs to be treated . Lisinopril 10  Per day low dose ACE inhibitor although may need combination medicine and more aggressive treatment see how she responds to this  she is a wrist monitor at home or have her  bring it in at next visit.  Need labs done etc   he is basically fasting today History palpitations intermittent check thyroid tests.  rov    3-4  Weeks and monitor bp  Flu vaccine today   Total visit > 50% spent counseling and coordinating care

## 2011-12-12 NOTE — Patient Instructions (Signed)
Some of your pain may be from TMJ problem however you could have a facial nerve pain which is little bit different. I would have the oral surgeon evaluate first as to opinion of cause of your discomfort.  Some people respond to medications for nerve pain ; is possible to biteplate might help also.  We need to treat her high blood pressure because I'm concerned it's very high. The anti-inflammatories you're taking for pain can also elevate blood pressure. We will do laboratory studies today to check for kidney function thyroid etc.  Limit alcohol to 7 beverages a week and watch for too much caffeine as this can cause stress and palpitations.  We will begin an ACE inhibitor one pill a day and have you come back in 3-4 weeks. May have to adjust the medication add others until you get past control.  Contact us in the meantime if he cannot follow through with the plan. Or have questions.

## 2011-12-13 ENCOUNTER — Encounter: Payer: Self-pay | Admitting: Internal Medicine

## 2011-12-19 ENCOUNTER — Other Ambulatory Visit (HOSPITAL_COMMUNITY): Payer: Self-pay | Admitting: Oral and Maxillofacial Surgery

## 2011-12-19 DIAGNOSIS — M26639 Articular disc disorder of temporomandibular joint, unspecified side: Secondary | ICD-10-CM

## 2011-12-28 ENCOUNTER — Ambulatory Visit (HOSPITAL_COMMUNITY)
Admission: RE | Admit: 2011-12-28 | Discharge: 2011-12-28 | Disposition: A | Discharge status: Home / Self Care | Payer: Medicare Other | Source: Ambulatory Visit | Attending: Oral and Maxillofacial Surgery | Admitting: Oral and Maxillofacial Surgery

## 2011-12-28 DIAGNOSIS — R6884 Jaw pain: Secondary | ICD-10-CM | POA: Diagnosis not present

## 2011-12-28 DIAGNOSIS — M26639 Articular disc disorder of temporomandibular joint, unspecified side: Secondary | ICD-10-CM

## 2011-12-28 DIAGNOSIS — M2669 Other specified disorders of temporomandibular joint: Secondary | ICD-10-CM | POA: Insufficient documentation

## 2012-01-03 ENCOUNTER — Ambulatory Visit (INDEPENDENT_AMBULATORY_CARE_PROVIDER_SITE_OTHER): Payer: Medicare Other | Admitting: Internal Medicine

## 2012-01-03 ENCOUNTER — Telehealth: Payer: Self-pay | Admitting: Family Medicine

## 2012-01-03 ENCOUNTER — Encounter: Payer: Self-pay | Admitting: Internal Medicine

## 2012-01-03 VITALS — BP 144/77 | HR 79 | Temp 97.9°F | Wt 138.0 lb

## 2012-01-03 DIAGNOSIS — H6120 Impacted cerumen, unspecified ear: Secondary | ICD-10-CM

## 2012-01-03 DIAGNOSIS — R6884 Jaw pain: Secondary | ICD-10-CM

## 2012-01-03 DIAGNOSIS — I10 Essential (primary) hypertension: Secondary | ICD-10-CM | POA: Diagnosis not present

## 2012-01-03 NOTE — Progress Notes (Signed)
Chief Complaint  Patient presents with  . Follow-up    Would like to have her left ear checked.  . Hypertension    HPI: Pt comes in for fu of a number of problems from 3 weeks ago  And new problem:  Jaw pain right : Had an mri of  Area and To see dr Owelsly/ in fu for results and plan  Hard to open mouth and chew having a consult about plan soon. BP better brings in her wrist monitor getting 116  120 range at home no syncope taking med at night  Some light headedness.  Had painful MRI NOt taking much ibu profen because of BP .  Left ear clogged  ; Using wax drops used check ear.  ROS: See pertinent positives and negatives per HPI.No cp sob syncope new pain vision change gets pounding sounds in ears at night feels from anxiety  Past Medical History  Diagnosis Date  . Depression     in the past  . Cystocele   . Hematuria     idiopathic  . Urinary incontinence   . Basal cell cancer     nose  . Osteopenia     dexa 2009, -2.3 osteoporosis -2.5 hip 2011  . Osteoarthritis     Family History  Problem Relation Age of Onset  . Pulmonary embolism Mother   . Depression Mother     major depressive disorder  . Cancer Father     bladder  . Diabetes Brother   . Hyperlipidemia Brother   . Arthritis Brother   . Arthritis Brother     DJD    History   Social History  . Marital Status: Married    Spouse Name: N/A    Number of Children: N/A  . Years of Education: N/A   Social History Main Topics  . Smoking status: Never Smoker   . Smokeless tobacco: None  . Alcohol Use: Yes  . Drug Use: No  . Sexually Active:    Other Topics Concern  . None   Social History Narrative   Retired Diplomatic Services operational officer) some collegeMarried 4 children [place], and [place], and [place] Husband cancer MM. NHL in remissionRegular exercise- yesHH of 2No pets Multiple myeloma and non hogkins lymphoma in husband x 6 years    Current outpatient prescriptions:calcium-vitamin D (OSCAL WITH D) 500-200  MG-UNIT per tablet, Take 1 tablet by mouth daily.  , Disp: , Rfl: ;  ibuprofen (ADVIL,MOTRIN) 200 MG tablet, Take 200 mg by mouth every 6 (six) hours as needed.  , Disp: , Rfl: ;  lisinopril (PRINIVIL,ZESTRIL) 10 MG tablet, Take 1 tablet (10 mg total) by mouth daily. May increase to 2 per day as directed, Disp: 90 tablet, Rfl: 1  EXAM: BP 144/77  Pulse 79  Temp 97.9 F (36.6 C) (Oral)  Wt 138 lb (62.596 kg)  SpO2 96% Repeat  bp check with monitor confirm same bp reading as provider . m  GENERAL: vitals reviewed and listed above, alert, oriented, appears well hydrated and in no acute distress  HEENT: atraumatic, conjunttiva clear, no obvious abnormalities on inspection of external nose and ears OP : no lesin edema or exudate  RIght eac nl minimal wax ans tm intact left eac impacted soft brown wax   After irrigation tm with nl LM   NECK: no obvious masses on inspection palpation  No bruits   LUNGS: clear to auscultation bilaterally, no wheezes, rales or rhonchi, good air movement  CV: HRRR,  no clubbing cyanosis or  peripheral edema nl cap refill  Neuro seems non focal except hearing not tested MS: moves all extremities without noticeable focal  abnormality  PSYCH: pleasant and cooperative, no obvious depression mild anxiety much more relaxed today  Lab Results  Component Value Date   WBC 5.4 12/12/2011   HGB 12.5 12/12/2011   HCT 38.2 12/12/2011   PLT 263.0 12/12/2011   GLUCOSE 90 12/12/2011   CHOL 193 12/12/2011   TRIG 122.0 12/12/2011   HDL 46.30 12/12/2011   LDLDIRECT 137.2 12/14/2010   LDLCALC 122* 12/12/2011   ALT 13 12/12/2011   AST 16 12/12/2011   NA 142 12/12/2011   K 3.6 12/12/2011   CL 105 12/12/2011   CREATININE 0.8 12/12/2011   BUN 20 12/12/2011   CO2 27 12/12/2011   TSH 2.72 12/12/2011  BP log reviewed   ASSESSMENT AND PLAN:  Discussed the following assessment and plan: 1. Hypertension    improved low dose acei continue to monitor  home machine seems  accurate  plan rov 2 months   2. Pain in lower jaw   3. Jaw pain    under eval proceed with OS consult  4. Excess wax in ear    left removed today  irrigation   Aleve.  Ok in moderation  Mild dizziness with med should go away . -Patient advised to return or notify care team immediately if symptoms worsen or persist or new concerns arise.  Patient Instructions  Your blood pressure is better. continue for now on medication   And monitoring about 3 x weeks Contact us if  Low under 100.  Agree with  Proceeding to Oral surgery consult.   Plan ROV in 2 months  And may recheck potassium at that point.       Lorretta Harp

## 2012-01-03 NOTE — Telephone Encounter (Signed)
The pt did not show for her 3 week fu today.  She had labs.  What would you like for me to tell her about them?

## 2012-01-03 NOTE — Telephone Encounter (Signed)
i thought i told her they were good  When she came in today  but didn't print them . You can mail them to her.

## 2012-01-03 NOTE — Patient Instructions (Signed)
Your blood pressure is better. continue for now on medication   And monitoring about 3 x weeks Contact us if  Low under 100.  Agree with  Proceeding to Oral surgery consult.   Plan ROV in 2 months  And may recheck potassium at that point.

## 2012-01-04 NOTE — Telephone Encounter (Signed)
Labs given in OV.

## 2012-01-16 ENCOUNTER — Ambulatory Visit
Admission: RE | Admit: 2012-01-16 | Discharge: 2012-01-16 | Disposition: A | Discharge status: Home / Self Care | Payer: Medicare Other | Source: Ambulatory Visit | Attending: Internal Medicine | Admitting: Internal Medicine

## 2012-01-16 DIAGNOSIS — M26639 Articular disc disorder of temporomandibular joint, unspecified side: Secondary | ICD-10-CM | POA: Diagnosis not present

## 2012-01-16 DIAGNOSIS — Z1231 Encounter for screening mammogram for malignant neoplasm of breast: Secondary | ICD-10-CM | POA: Diagnosis not present

## 2012-03-06 ENCOUNTER — Ambulatory Visit (INDEPENDENT_AMBULATORY_CARE_PROVIDER_SITE_OTHER): Payer: Medicare Other | Admitting: Internal Medicine

## 2012-03-06 ENCOUNTER — Encounter: Payer: Self-pay | Admitting: Internal Medicine

## 2012-03-06 VITALS — BP 116/70 | HR 93 | Temp 97.4°F | Wt 138.0 lb

## 2012-03-06 DIAGNOSIS — R6884 Jaw pain: Secondary | ICD-10-CM

## 2012-03-06 DIAGNOSIS — I10 Essential (primary) hypertension: Secondary | ICD-10-CM

## 2012-03-06 LAB — BASIC METABOLIC PANEL
Chloride: 101 mEq/L (ref 96–112)
Potassium: 4.2 mEq/L (ref 3.5–5.1)
Sodium: 136 mEq/L (ref 135–145)

## 2012-03-06 NOTE — Patient Instructions (Signed)
Continue lifestyle intervention healthy eating and exercise . Blood pressure is excellent.  Continue med for now. Will notify you  of labs when available. Recheck wellness visit .

## 2012-03-06 NOTE — Progress Notes (Signed)
Chief Complaint  Patient presents with  . Follow-up    Has a "bump" on her finger that she would like to have looked at.    HPI: Patient comes in today for follow up of  multiple medical problems.   Bp readings in 110 range  Taking 10 acei and no se noted doing better  . No cp sob. Jaw pain: Has seen dr Virgel Paling and splint has helped with flexeril 5 mg at night  And  General improved a still pain with eating.    Ongoing adjustments   Management without surgery advised.  Check nodule right finger no pain or injury .  ROS: See pertinent positives and negatives per HPI.  Past Medical History  Diagnosis Date  . Depression     in the past  . Cystocele   . Hematuria     idiopathic  . Urinary incontinence   . Basal cell cancer     nose  . Osteopenia     dexa 2009, -2.3 osteoporosis -2.5 hip 2011  . Osteoarthritis     Family History  Problem Relation Age of Onset  . Pulmonary embolism Mother   . Depression Mother     major depressive disorder  . Cancer Father     bladder  . Diabetes Brother   . Hyperlipidemia Brother   . Arthritis Brother   . Arthritis Brother     DJD    History   Social History  . Marital Status: Married    Spouse Name: N/A    Number of Children: N/A  . Years of Education: N/A   Social History Main Topics  . Smoking status: Never Smoker   . Smokeless tobacco: None  . Alcohol Use: Yes  . Drug Use: No  . Sexually Active:    Other Topics Concern  . 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 NHL in remissionRegular exercise- yesHH of 2No pets Multiple myeloma and non hogkins lymphoma in husband x 6 years    Outpatient Encounter Prescriptions as of 03/06/2012  Medication Sig Dispense Refill  . calcium-vitamin D (OSCAL WITH D) 500-200 MG-UNIT per tablet Take 1 tablet by mouth daily.        . cyclobenzaprine (FLEXERIL) 10 MG tablet Take 10 mg by  mouth at bedtime as needed. Taking half tab      . ibuprofen (ADVIL,MOTRIN) 200 MG tablet Take 200 mg by mouth every 6 (six) hours as needed.        Marland Kitchen lisinopril (PRINIVIL,ZESTRIL) 10 MG tablet Take 1 tablet (10 mg total) by mouth daily. May increase to 2 per day as directed  90 tablet  1    EXAM:  BP 116/70  Pulse 93  Temp 97.4 F (36.3 C) (Oral)  Wt 138 lb (62.596 kg)  SpO2 97%  There is no height on file to calculate BMI.  GENERAL: vitals reviewed and listed above, alert, oriented, appears well hydrated and in no acute distress CV: HRRR, no clubbing cyanosis  MS: moves all extremities without noticeable focal  Abnormality smal nodule distal pipR index  PSYCH: pleasant and cooperative,  More comfortable today more relaxed   ASSESSMENT AND PLAN:  Discussed the following assessment and plan:  1. Hypertension  Basic metabolic panel   improved  controled check chem today and fu at wellness  2. Pain in lower jaw     under care managment  Nodule right index finger poss arthritis not alarming to follow  -Patient advised to return or notify health care team  immediately if symptoms worsen or persist or new concerns arise.  Patient Instructions  Continue lifestyle intervention healthy eating and exercise . Blood pressure is excellent.  Continue med for now. Will notify you  of labs when available. Recheck wellness visit .    Neta Mends. Panosh M.D.

## 2012-03-07 ENCOUNTER — Encounter: Payer: Self-pay | Admitting: Family Medicine

## 2012-06-11 ENCOUNTER — Other Ambulatory Visit: Payer: Self-pay | Admitting: Internal Medicine

## 2012-11-26 DIAGNOSIS — H524 Presbyopia: Secondary | ICD-10-CM | POA: Diagnosis not present

## 2012-11-26 DIAGNOSIS — H2510 Age-related nuclear cataract, unspecified eye: Secondary | ICD-10-CM | POA: Diagnosis not present

## 2012-11-26 DIAGNOSIS — H52209 Unspecified astigmatism, unspecified eye: Secondary | ICD-10-CM | POA: Diagnosis not present

## 2012-11-26 DIAGNOSIS — H3530 Unspecified macular degeneration: Secondary | ICD-10-CM | POA: Diagnosis not present

## 2012-12-04 ENCOUNTER — Ambulatory Visit (INDEPENDENT_AMBULATORY_CARE_PROVIDER_SITE_OTHER): Payer: Medicare Other | Admitting: Internal Medicine

## 2012-12-04 ENCOUNTER — Encounter: Payer: Self-pay | Admitting: Internal Medicine

## 2012-12-04 VITALS — BP 136/70 | HR 63 | Temp 98.3°F | Ht 64.25 in | Wt 126.0 lb

## 2012-12-04 DIAGNOSIS — H6120 Impacted cerumen, unspecified ear: Secondary | ICD-10-CM

## 2012-12-04 DIAGNOSIS — Z23 Encounter for immunization: Secondary | ICD-10-CM | POA: Diagnosis not present

## 2012-12-04 DIAGNOSIS — M899 Disorder of bone, unspecified: Secondary | ICD-10-CM

## 2012-12-04 DIAGNOSIS — Z8262 Family history of osteoporosis: Secondary | ICD-10-CM | POA: Diagnosis not present

## 2012-12-04 DIAGNOSIS — Z0000 Encounter for general adult medical examination without abnormal findings: Secondary | ICD-10-CM | POA: Diagnosis not present

## 2012-12-04 DIAGNOSIS — I10 Essential (primary) hypertension: Secondary | ICD-10-CM | POA: Diagnosis not present

## 2012-12-04 DIAGNOSIS — Z1211 Encounter for screening for malignant neoplasm of colon: Secondary | ICD-10-CM | POA: Diagnosis not present

## 2012-12-04 DIAGNOSIS — H6123 Impacted cerumen, bilateral: Secondary | ICD-10-CM

## 2012-12-04 DIAGNOSIS — M8580 Other specified disorders of bone density and structure, unspecified site: Secondary | ICD-10-CM | POA: Insufficient documentation

## 2012-12-04 LAB — CBC WITH DIFFERENTIAL/PLATELET
Basophils Relative: 0.6 % (ref 0.0–3.0)
Eosinophils Relative: 1.8 % (ref 0.0–5.0)
HCT: 36.7 % (ref 36.0–46.0)
Hemoglobin: 12.3 g/dL (ref 12.0–15.0)
Lymphs Abs: 1.9 10*3/uL (ref 0.7–4.0)
MCV: 93.7 fl (ref 78.0–100.0)
Monocytes Absolute: 0.6 10*3/uL (ref 0.1–1.0)
Neutro Abs: 4.1 10*3/uL (ref 1.4–7.7)
Platelets: 276 10*3/uL (ref 150.0–400.0)
RBC: 3.92 Mil/uL (ref 3.87–5.11)
WBC: 6.6 10*3/uL (ref 4.5–10.5)

## 2012-12-04 LAB — HEPATIC FUNCTION PANEL
ALT: 9 U/L (ref 0–35)
AST: 14 U/L (ref 0–37)
Albumin: 4.2 g/dL (ref 3.5–5.2)
Alkaline Phosphatase: 66 U/L (ref 39–117)
Total Protein: 6.6 g/dL (ref 6.0–8.3)

## 2012-12-04 LAB — BASIC METABOLIC PANEL
CO2: 31 mEq/L (ref 19–32)
Chloride: 106 mEq/L (ref 96–112)
GFR: 87.07 mL/min (ref >60.00)
Glucose, Bld: 89 mg/dL (ref 70–99)
Potassium: 5.1 mEq/L (ref 3.5–5.1)
Sodium: 142 mEq/L (ref 135–145)

## 2012-12-04 LAB — LIPID PANEL: VLDL: 16.8 mg/dL (ref 0.0–40.0)

## 2012-12-04 MED ORDER — LISINOPRIL 10 MG PO TABS: ORAL_TABLET | ORAL | Status: DC

## 2012-12-04 NOTE — Patient Instructions (Signed)
Get bone density and then we can get an opinion from Dr Elvera Lennox  About  Advisability of intervention  With your family hx.  Refer for routine colonoscopy cancer screening.  Check on zostavax reimbursement and can get this at any time call ahead.

## 2012-12-04 NOTE — Progress Notes (Signed)
Chief Complaint  Patient presents with  . Medicare Wellness    HPI: Patient comes in today for Preventive Medicare wellness visit . No major injuries, ed visits ,hospitalizations , new medications since last visit. Bp  Ok  Taking medication  Hs. Seeing nutritionist.   Hip arthritis and biking.  Moving.  Jaw coping having dental appliance   Hearing:  ok  Vision:  No limitations at present . Last eye check UTD  Safety:  Has smoke detector and wears seat belts.  No firearms. No excess sun exposure. Sees dentist regularly.  Falls:  no  Advance directive :  Reviewed  .  Memory: Felt to be good  , no concern from her or her family.  Depression: No anhedonia unusual crying or depressive symptoms  Nutrition: Eats well balanced diet; adequate calcium and vitamin D. No swallowing chewing problems.  Injury: no major injuries in the last six months.  Other healthcare providers:  Reviewed today .  Social:  Lives with spouse married.2 No pets.   Preventive parameters: up-to-date  Reviewed   ADLS:   There are no problems or need for assistance  driving, feeding, obtaining food, dressing, toileting and bathing, managing money using phone. She is independent.  EXERCISE/ HABITS  Per week  Exercise 3-5 per week. No tobacco    etoh wine about 4 per week.    ROS:  GEN/ HEENT: No fever, significant weight changes sweats headaches vision problems hearing changes, CV/ PULM; No chest pain shortness of breath cough, syncope,edema  change in exercise tolerance. GI /GU: No adominal pain, vomiting, change in bowel habits. No blood in the stool. No significant GU symptoms. SKIN/HEME: ,no acute skin rashes suspicious lesions or bleeding. No lymphadenopathy, nodules, masses.  NEURO/ PSYCH:  No neurologic signs such as weakness numbness. No depression anxiety. IMM/ Allergy: No unusual infections.  Allergy .   REST of 12 system review negative except as per HPI   Past Medical History  Diagnosis  Date  . Depression     in the past  . Cystocele   . Hematuria     idiopathic  . Urinary incontinence   . Basal cell cancer     nose  . Osteopenia     dexa 2009, -2.3 osteoporosis -2.5 hip 2011  . Osteoarthritis     Family History  Problem Relation Age of Onset  . Pulmonary embolism Mother   . Depression Mother     major depressive disorder  . Cancer Father     bladder  . Diabetes Brother   . Hyperlipidemia Brother   . Arthritis Brother   . Arthritis Brother     DJD  . Hip fracture Mother     about 79     History   Social History  . Marital Status: Married    Spouse Name: N/A    Number of Children: N/A  . Years of Education: N/A   Social History Main Topics  . Smoking status: Never Smoker   . Smokeless tobacco: None  . Alcohol Use: Yes  . Drug Use: No  . Sexual Activity:    Other Topics Concern  . None   Social History Narrative   Retired Diplomatic Services operational officer) some college   Married 4 children [place],, Print production planner, and [place], and GC Husband cancer MM. NHL in remission   Regular exercise- yes   HH of 2   No pets    Multiple myeloma and non hogkins lymphoma in husband x 6 years  Outpatient Encounter Prescriptions as of 12/04/2012  Medication Sig Dispense Refill  . calcium-vitamin D (OSCAL WITH D) 500-200 MG-UNIT per tablet Take 1 tablet by mouth daily.        . cyclobenzaprine (FLEXERIL) 10 MG tablet Take 10 mg by mouth at bedtime as needed. Taking half tab      . lisinopril (PRINIVIL,ZESTRIL) 10 MG tablet TAKE 1 TABLET (10 MG TOTAL) BY MOUTH DAILY. MAY INCREASE TO 2 PER DAY AS DIRECTED  90 tablet  3  . naproxen sodium (ANAPROX) 220 MG tablet Take 220 mg by mouth 2 (two) times daily.      . [DISCONTINUED] ibuprofen (ADVIL,MOTRIN) 200 MG tablet Take 200 mg by mouth every 6 (six) hours as needed.        . [DISCONTINUED] lisinopril (PRINIVIL,ZESTRIL) 10 MG tablet TAKE 1 TABLET (10 MG TOTAL) BY MOUTH DAILY. MAY INCREASE TO 2 PER DAY AS DIRECTED  90 tablet  1  .  [DISCONTINUED] lisinopril (PRINIVIL,ZESTRIL) 10 MG tablet Take 1 tablet (10 mg total) by mouth daily.  90 tablet  1   No facility-administered encounter medications on file as of 12/04/2012.    EXAM:  BP 136/70  Pulse 63  Temp(Src) 98.3 F (36.8 C) (Oral)  Ht 5' 4.25" (1.632 m)  Wt 126 lb (57.153 kg)  BMI 21.46 kg/m2  SpO2 98%  Body mass index is 21.46 kg/(m^2).  Physical Exam: Vital signs reviewed ZOX:WRUE is a well-developed well-nourished alert cooperative   who appears stated age in no acute distress.  HEENT: normocephalic atraumatic , Eyes: PERRL EOM's full, conjunctiva clear, Nares: paten,t no deformity discharge or tenderness., Ears: no deformity EAC's clear TMs with normal landmarks. After impacted wax removed  Mouth: clear OP, no lesions, edema.  Moist mucous membranes. Dentition in adequate repair. NECK: supple without masses, thyromegaly or bruits. CHEST/PULM:  Clear to auscultation and percussion breath sounds equal no wheeze , rales or rhonchi. No chest wall deformities or tenderness. Breast: normal by inspection . No dimpling, discharge, masses, tenderness or discharge . CV: PMI is nondisplaced, S1 S2 no gallops, murmurs, rubs. Peripheral pulses are full without delay.No JVD .  ABDOMEN: Bowel sounds normal nontender  No guard or rebound, no hepato splenomegal no CVA tenderness.  No hernia. Extremtities:  No clubbing cyanosis or edema, no acute joint swelling or redness no focal atrophy NEURO:  Oriented x3, cranial nerves 3-12 appear to be intact, no obvious focal weakness,gait within normal limits no abnormal reflexes or asymmetrical SKIN: No acute rashes normal turgor, color, no bruising or petechiae. PSYCH: Oriented, good eye contact, no obvious depression anxiety, cognition and judgment appear normal. LN: no cervical axillary inguinal adenopathy No noted deficits in memory, attention, and speech.   Lab Results  Component Value Date   WBC 6.6 12/04/2012   HGB 12.3  12/04/2012   HCT 36.7 12/04/2012   PLT 276.0 12/04/2012   GLUCOSE 89 12/04/2012   CHOL 178 12/04/2012   TRIG 84.0 12/04/2012   HDL 52.50 12/04/2012   LDLDIRECT 137.2 12/14/2010   LDLCALC 109* 12/04/2012   ALT 9 12/04/2012   AST 14 12/04/2012   NA 142 12/04/2012   K 5.1 12/04/2012   CL 106 12/04/2012   CREATININE 0.7 12/04/2012   BUN 12 12/04/2012   CO2 31 12/04/2012   TSH 3.05 12/04/2012    ASSESSMENT AND PLAN:  Discussed the following assessment and plan:  Visit for preventive health examination - Plan: naproxen sodium (ANAPROX) 220 MG tablet, Flu Vaccine QUAD  36+ mos PF IM (Fluarix), Basic metabolic panel, Hepatic function panel, Lipid panel, TSH, DG Bone Density, CBC with Differential, CANCELED: TSH  Medicare annual wellness visit, subsequent - Plan: naproxen sodium (ANAPROX) 220 MG tablet, Flu Vaccine QUAD 36+ mos PF IM (Fluarix), Basic metabolic panel, Hepatic function panel, Lipid panel, TSH, DG Bone Density, CBC with Differential, CANCELED: TSH  Hypertension - Plan: naproxen sodium (ANAPROX) 220 MG tablet, Flu Vaccine QUAD 36+ mos PF IM (Fluarix), Basic metabolic panel, Hepatic function panel, Lipid panel, TSH, DG Bone Density, CBC with Differential, CANCELED: TSH  Need for prophylactic vaccination and inoculation against influenza - Plan: naproxen sodium (ANAPROX) 220 MG tablet, Flu Vaccine QUAD 36+ mos PF IM (Fluarix), Basic metabolic panel, Hepatic function panel, Lipid panel, TSH, DG Bone Density, CBC with Differential, CANCELED: TSH  Osteopenia - check dexa and opinon dr Elvera Lennox  - Plan: naproxen sodium (ANAPROX) 220 MG tablet, Flu Vaccine QUAD 36+ mos PF IM (Fluarix), Basic metabolic panel, Hepatic function panel, Lipid panel, TSH, DG Bone Density, CBC with Differential, CANCELED: TSH  Fam hx-osteoporosis - Plan: naproxen sodium (ANAPROX) 220 MG tablet, Flu Vaccine QUAD 36+ mos PF IM (Fluarix), Basic metabolic panel, Hepatic function panel, Lipid panel, TSH, DG Bone Density, CBC with  Differential, Ambulatory referral to Endocrinology, CANCELED: TSH  OSTEOPENIA - Plan: naproxen sodium (ANAPROX) 220 MG tablet, Flu Vaccine QUAD 36+ mos PF IM (Fluarix), Basic metabolic panel, Hepatic function panel, Lipid panel, TSH, DG Bone Density, CBC with Differential, Ambulatory referral to Endocrinology, CANCELED: TSH  Colon cancer screening - Plan: Ambulatory referral to Gastroenterology  Impacted ear wax, bilateral Left ear  Wax   Removed by irrigation   Nl tms  Patient Care Team: Madelin Headings, MD as PCP - General Loreta Ave, MD (Orthopedic Surgery) Noralee Stain, MD (Dermatology) Beatriz Chancellor, MD as Attending Physician (Oral Surgery)  Patient Instructions  Get bone density and then we can get an opinion from Dr Elvera Lennox  About  Advisability of intervention  With your family hx.  Refer for routine colonoscopy cancer screening.  Check on zostavax reimbursement and can get this at any time call ahead.      Neta Mends. Panosh M.D.  Health Maintenance  Topic Date Due  . Zostavax  03/03/2005  . Colonoscopy  02/29/2012  . Influenza Vaccine  09/28/2013  . Mammogram  01/15/2014  . Tetanus/tdap  02/28/2018  . Pneumococcal Polysaccharide Vaccine Age 70 And Over  Completed   Health Maintenance Review

## 2012-12-06 ENCOUNTER — Ambulatory Visit (INDEPENDENT_AMBULATORY_CARE_PROVIDER_SITE_OTHER)
Admission: RE | Admit: 2012-12-06 | Discharge: 2012-12-06 | Disposition: A | Discharge status: Home / Self Care | Payer: Medicare Other | Source: Ambulatory Visit | Attending: Internal Medicine | Admitting: Internal Medicine

## 2012-12-06 DIAGNOSIS — I10 Essential (primary) hypertension: Secondary | ICD-10-CM

## 2012-12-06 DIAGNOSIS — M899 Disorder of bone, unspecified: Secondary | ICD-10-CM | POA: Diagnosis not present

## 2012-12-06 DIAGNOSIS — Z0000 Encounter for general adult medical examination without abnormal findings: Secondary | ICD-10-CM

## 2012-12-06 DIAGNOSIS — Z8262 Family history of osteoporosis: Secondary | ICD-10-CM

## 2012-12-06 DIAGNOSIS — Z23 Encounter for immunization: Secondary | ICD-10-CM | POA: Diagnosis not present

## 2012-12-06 DIAGNOSIS — M8580 Other specified disorders of bone density and structure, unspecified site: Secondary | ICD-10-CM

## 2012-12-08 DIAGNOSIS — H6120 Impacted cerumen, unspecified ear: Secondary | ICD-10-CM | POA: Insufficient documentation

## 2012-12-08 DIAGNOSIS — Z8262 Family history of osteoporosis: Secondary | ICD-10-CM | POA: Insufficient documentation

## 2012-12-13 ENCOUNTER — Encounter: Payer: Self-pay | Admitting: Internal Medicine

## 2012-12-19 ENCOUNTER — Other Ambulatory Visit: Payer: Self-pay

## 2012-12-19 DIAGNOSIS — Z1231 Encounter for screening mammogram for malignant neoplasm of breast: Secondary | ICD-10-CM

## 2012-12-24 ENCOUNTER — Ambulatory Visit (INDEPENDENT_AMBULATORY_CARE_PROVIDER_SITE_OTHER): Payer: Medicare Other | Admitting: Family Medicine

## 2012-12-24 ENCOUNTER — Encounter: Payer: Self-pay | Admitting: Gastroenterology

## 2012-12-24 DIAGNOSIS — Z23 Encounter for immunization: Secondary | ICD-10-CM | POA: Diagnosis not present

## 2012-12-24 DIAGNOSIS — Z2911 Encounter for prophylactic immunotherapy for respiratory syncytial virus (RSV): Secondary | ICD-10-CM

## 2012-12-25 ENCOUNTER — Ambulatory Visit: Payer: Medicare Other | Admitting: Internal Medicine

## 2012-12-29 ENCOUNTER — Ambulatory Visit (INDEPENDENT_AMBULATORY_CARE_PROVIDER_SITE_OTHER): Payer: Medicare Other | Admitting: Internal Medicine

## 2012-12-29 ENCOUNTER — Encounter: Payer: Self-pay | Admitting: Internal Medicine

## 2012-12-29 VITALS — BP 128/74 | HR 74 | Temp 98.3°F | Ht 64.25 in | Wt 129.0 lb

## 2012-12-29 DIAGNOSIS — J040 Acute laryngitis: Secondary | ICD-10-CM

## 2012-12-29 DIAGNOSIS — J988 Other specified respiratory disorders: Secondary | ICD-10-CM

## 2012-12-29 DIAGNOSIS — J22 Unspecified acute lower respiratory infection: Secondary | ICD-10-CM

## 2012-12-29 DIAGNOSIS — R6884 Jaw pain: Secondary | ICD-10-CM | POA: Diagnosis not present

## 2012-12-29 MED ORDER — HYDROCODONE-HOMATROPINE 5-1.5 MG/5ML PO SYRP: 5.0000 mL | ORAL_SOLUTION | ORAL | Status: DC | PRN

## 2012-12-29 NOTE — Patient Instructions (Signed)
i think this is a viral  Laryngitis . Warm   Liquids  voice rest .  Cough med at night or as needed. Can try adding  Chlorpheniramine short acting at night for drainage  And see if helps you sleep. Can cause sedation.  Expect to be a lot better in another week .  contact us if fever  Shortness of breath etc.

## 2012-12-29 NOTE — Progress Notes (Signed)
Chief Complaint  Patient presents with  . Cough    dry X 1 week  . 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 Onset about a week ago scratchy throat  and recently hoarse for 2 days  No UR sx.   Aleve  Jaw pain is worse cant use mouth piece worried about giving it to  husband .  No fever no sob  Throat  hurts  No meds  Dec appetite no v d  ROS: See pertinent positives and negatives per HPI.  Past Medical History  Diagnosis Date  . Depression     in the past  . Cystocele   . Hematuria     idiopathic  . Urinary incontinence   . Basal cell cancer     nose  . Osteopenia     dexa 2009, -2.3 osteoporosis -2.5 hip 2011  . Osteoarthritis     Family History  Problem Relation Age of Onset  . Pulmonary embolism Mother   . Depression Mother     major depressive disorder  . Cancer Father     bladder  . Diabetes Brother   . Hyperlipidemia Brother   . Arthritis Brother   . Arthritis Brother     DJD  . Hip fracture Mother     about 19     History   Social History  . Marital Status: Married    Spouse Name: N/A    Number of Children: N/A  . Years of Education: N/A   Social History Main Topics  . Smoking status: Never Smoker   . Smokeless tobacco: None  . Alcohol Use: Yes  . Drug Use: No  . Sexual Activity:    Other Topics Concern  . None   Social History Narrative   Retired Diplomatic Services operational officer) some college   Married 4 children [place],, Print production planner, and [place], and GC Husband cancer MM. NHL in remission   Regular exercise- yes   HH of 2   No pets    Multiple myeloma and non hogkins lymphoma in husband x 6 years    Outpatient Encounter Prescriptions as of 12/29/2012  Medication Sig  . calcium-vitamin D (OSCAL WITH D) 500-200 MG-UNIT per tablet Take 1 tablet by mouth daily.    . cyclobenzaprine  (FLEXERIL) 10 MG tablet Take 10 mg by mouth at bedtime as needed. Taking half tab  . lisinopril (PRINIVIL,ZESTRIL) 10 MG tablet TAKE 1 TABLET (10 MG TOTAL) BY MOUTH DAILY. MAY INCREASE TO 2 PER DAY AS DIRECTED  . naproxen sodium (ANAPROX) 220 MG tablet Take 220 mg by mouth 2 (two) times daily.  Marland Kitchen HYDROcodone-homatropine (HYCODAN) 5-1.5 MG/5ML syrup Take 5 mLs by mouth every 4 (four) hours as needed for cough.    EXAM:  BP 128/74  Pulse 74  Temp(Src) 98.3 F (36.8 C) (Oral)  Ht 5' 4.25" (1.632 m)  Wt 129 lb (58.514 kg)  BMI 21.97 kg/m2  SpO2 98%  Body mass index is 21.97 kg/(m^2). WDWN in NAD  quiet respirations; mildly congested  Mod  hoarse. Non toxic . HEENT: Normocephalic ;atraumatic , Eyes;  PERRL, EOMs  Full, lids and conjunctiva clear,,Ears: no deformities, canals nl, TM landmarks normal, Nose: no deformity or discharge but congested;face non tender Mouth : OP clear without lesion or  edema . Neck: Supple without adenopathy or masses or bruits Chest:  Clear to A&P without wheezes rales or rhonchi CV:  S1-S2 no gallops or murmurs peripheral perfusion is normal Skin :nl perfusion and no acute rashes  PSYCH: pleasant and cooperative, no obvious depression or anxiety  ASSESSMENT AND PLAN:  Discussed the following assessment and plan:  Acute viral laryngitis  Acute respiratory infection  Jaw pain - worse with above    Expectant management. Not from shingles vaccine Counseled. Comfort  Cough med and fluids  Humidity etc  -Patient advised to return or notify health care team  if symptoms worsen or persist or new concerns arise.  Patient Instructions  i think this is a viral  Laryngitis . Warm   Liquids  voice rest .  Cough med at night or as needed. Can try adding  Chlorpheniramine short acting at night for drainage  And see if helps you sleep. Can cause sedation.  Expect to be a lot better in another week .  contact us if fever  Shortness of breath etc.    Neta Mends.  Nicole Wood M.D.

## 2012-12-31 ENCOUNTER — Encounter: Payer: Self-pay | Admitting: Internal Medicine

## 2013-01-03 ENCOUNTER — Encounter: Payer: Self-pay | Admitting: Internal Medicine

## 2013-01-03 ENCOUNTER — Ambulatory Visit (INDEPENDENT_AMBULATORY_CARE_PROVIDER_SITE_OTHER): Payer: Medicare Other | Admitting: Internal Medicine

## 2013-01-03 VITALS — BP 104/68 | HR 75 | Temp 98.1°F | Resp 10 | Ht 64.25 in | Wt 126.0 lb

## 2013-01-03 DIAGNOSIS — M810 Age-related osteoporosis without current pathological fracture: Secondary | ICD-10-CM

## 2013-01-03 NOTE — Patient Instructions (Addendum)
Please return in 1 year. Please let me know the vit D dose in the Citracal D. Also, please let me know what you decided about the bisphosphonates.   Dietary sources of calcium and vitamin D:  Calcium content (mg) - http://www.niams.https://www.gonzalez.org/  Fortified oatmeal, 1 packet 350  Sardines, canned in oil, with edible bones, 3 oz. 324  Cheddar cheese, 1 oz. shredded 306  Milk, nonfat, 1 cup 302  Milkshake, 1 cup 300  Yogurt, plain, low-fat, 1 cup 300  Soybeans, cooked, 1 cup 261  Tofu, firm, with calcium,  cup 204  Orange juice, fortified with calcium, 6 oz. 200-260 (varies)  Salmon, canned, with edible bones, 3 oz. 181  Pudding, instant, made with 2% milk,  cup 153  Baked beans, 1 cup 142  Cottage cheese, 1% milk fat, 1 cup 138  Spaghetti, lasagna, 1 cup 125  Frozen yogurt, vanilla, soft-serve,  cup 103  Ready-to-eat cereal, fortified with calcium, 1 cup 100-1,000 (varies)  Cheese pizza, 1 slice 100  Fortified waffles, 2 100  Turnip greens, boiled,  cup 99  Broccoli, raw, 1 cup 90  Ice cream, vanilla,  cup 85  Soy or rice milk, fortified with calcium, 1 cup 80-500 (varies)   Vitamin D content (International Units, IU) - https://www.ars.usda.gov Cod liver oil, 1 tablespoon 1,360  Swordfish, cooked, 3 oz 566  Salmon (sockeye), cooked, 3 oz 447  Tuna fish, canned in water, drained, 3 oz 154  Orange juice fortified with vitamin D, 1 cup (check product labels, as amount of added vitamin D varies) 137  Milk, nonfat, reduced fat, and whole, vitamin D-fortified, 1 cup 115-124  Yogurt, fortified with 20% of the daily value for vitamin D, 6 oz 80  Margarine, fortified, 1 tablespoon 60  Sardines, canned in oil, drained, 2 sardines 46  Liver, beef, cooked, 3 oz 42  Egg, 1 large (vitamin D is found in yolk) 41  Ready-to-eat cereal, fortified with 10% of the daily value for vitamin D, 0.75-1 cup  40  Cheese, Swiss, 1 oz 6   Home Safety and Preventing Falls   Falls are a leading cause of injury and while they affect all age groups, falls have greater short-term and long-term impact on older age groups. However, falls should not be a part of life or aging. It is possible for individuals and their families to use preventive measures to significantly decrease the likelihood that anyone, especially an older adult, will fall.  There are many simple measures which can make your home safer with respect to preventing falls. The following actions can help reduce falls among all members of your family and are especially important as you age, when your balance, lower limb strength, coordination, and eyesight may be declining. The use of preventive measures will help to reduce you and your family's risk of falls and serious medical consequences.  OUTDOORS  Repair cracks and edges of walkways and driveways.  Remove high doorway thresholds and trim shrubbery on the main path into your home.  Ensure there is good outside lighting at main entrances and along main walkways.  Clear walkways of tools, rocks, debris, and clutter.  Check that handrails are not broken and are securely fastened. Both sides of steps should have handrails.  In the garage, be attentive to and clean up grease or oil spills on the cement. This can make the surface extremely slippery.  In winter, have leaves, snow, and ice cleared regularly.  Use sand or salt on walkways during  winter months.  BATHROOM  Install grab bars by the toilet and in the tub and shower.  Use non-skid mats or decals in the tub or shower.  If unable to easily stand unsupported while showering, place a plastic non slip stool in the shower to sit on when needed.  Install night lights.  Keep floors dry and clean up all water on the floor immediately.  Remove soap buildup in tub or shower on a regular basis.  Secure bath mats with non-slip, double-sided rug tape.  Remove tripping hazards from the floors.  BEDROOMS  Install  night lights.  Do not use oversized bedding.  Make sure a bedside light is easy to reach.  Keep a telephone by your bedside.  Make sure that you can get in and out of your bed easily.  Have a firm chair, with side arms, to use for getting dressed.  Remove clutter from around closets.  Store clothing, bed coverings, and other household items where you can reach them comfortably.  Remove tripping hazards from the floor.  LIVING AREAS AND STAIRWAYS  Turn on lights to avoid having to walk through dark areas.  Keep lighting uniform in each room. Place brighter lightbulbs in darker areas, including stairways.  Replace lightbulbs that burn out in stairways immediately.  Arrange furniture to provide for clear pathways.  Keep furniture in the same place.  Eliminate or tape down electrical cables in high traffic areas.  Place handrails on both sides of stairways. Use handrails when going up or down stairs.  Most falls occur on the top or bottom 3 steps.  Fix any loose handrails. Make sure handrails on both sides of the stairways are as long as the stairs.  Remove all walkway obstacles.  Coil or tape electrical cords off to the side of walking areas and out of the way. If using many extension cords, have an electrician put in a new wall outlet to reduce or eliminate them.  Make sure spills are cleaned up quickly and allow time for drying before walking on freshly cleaned floors.  Firmly attach carpet with non-skid or two-sided tape.  Keep frequently used items within easy reach.  Remove tripping hazards such as throw rugs and clutter in walkways. Never leave objects on stairs.  Get rid of throw rugs elsewhere if possible.  Eliminate uneven floor surfaces.  Make sure couches and chairs are easy to get into and out of.  Check carpeting to make sure it is firmly attached along stairs.  Make repairs to worn or loose carpet promptly.  Select a carpet pattern that does not visually hide the edge of  steps.  Avoid placing throw rugs or scatter rugs at the top or bottom of stairways, or properly secure with carpet tape to prevent slippage.  Have an electrician put in a light switch at the top and bottom of the stairs.  Get light switches that glow.  Avoid the following practices: hurrying, inattention, obscured vision, carrying large loads, and wearing slip-on shoes.  Be aware of all pets.  KITCHEN  Place items that are used frequently, such as dishes and food, within easy reach.  Keep handles on pots and pans toward the center of the stove. Use back burners when possible.  Make sure spills are cleaned up quickly and allow time for drying.  Avoid walking on wet floors.  Avoid hot utensils and knives.  Position shelves so they are not too high or low.  Place commonly used objects within  easy reach.  If necessary, use a sturdy step stool with a grab bar when reaching.  Make sure electrical cables are out of the way.  Do not use floor polish or wax that makes floors slippery.  OTHER HOME FALL PREVENTION STRATEGIES  Wear low heel or rubber sole shoes that are supportive and fit well.  Wear closed toe shoes.  Know and watch for side effects of medications. Have your caregiver or pharmacist look at all your medicines, even over-the-counter medicines. Some medicines can make you sleepy or dizzy.  Exercise regularly. Exercise makes you stronger and improves your balance and coordination.  Limit use of alcohol.  Use eyeglasses if necessary and keep them clean. Have your vision checked every year.  Organize your household in a manner that minimizes the need to walk distances when hurried, or go up and down stairs unnecessarily. For example, have a phone placed on at least each floor of your home. If possible, have a phone beside each sitting or lying area where you spend the most time at home. Keep emergency numbers posted at all phones.  Use non-skid floor wax.  When using a ladder, make sure:   The base is firm.  All ladder feet are on level ground.  The ladder is angled against the wall properly.  When climbing a ladder, face the ladder and hold the ladder rungs firmly.  If reaching, always keep your hips and body weight centered between the rails.  When using a stepladder, make sure it is fully opened and both spreaders are firmly locked.  Do not climb a closed stepladder.  Avoid climbing beyond the second step from the top of a stepladder and the 4th rung from the top of an extension ladder.  Learn and use mobility aids as needed.  Change positions slowly. Arise slowly from sitting and lying positions. Sit on the edge of your bed before getting to your feet.  If you have a history of falls, ask someone to add color or contrast paint or tape to grab bars and handrails in your home.  If you have a history of falls, ask someone to place contrasting color strips on first and last steps.  Install an electrical emergency response system if you need one, and know how to use it.  If you have a medical or other condition that causes you to have limited physical strength, it is important that you reach out to family and friends for occasional help.  Document Released: 02/04/2002 Document Revised: 02/03/2011 Document Reviewed: 12/04/2008  [facility] Patient Information 2012 [place], [place].  Exercise for Strong Bones (from [facility]) There are two types of exercises that are important for building and maintaining bone density:  weight-bearing and muscle-strengthening exercises. Weight-bearing Exercises These exercises include activities that make you move against gravity while staying upright. Weight-bearing exercises can be high-impact or low-impact. High-impact weight-bearing exercises help build bones and keep them strong. If you have broken a bone due to osteoporosis or are at risk of breaking a bone, you may need to avoid high-impact exercises. If you're not sure,  you should check with your healthcare provider. Examples of high-impact weight-bearing exercises are:   Dancing   Doing high-impact aerobics   Hiking   Jogging/running   Jumping Rope   Stair climbing   Tennis Low-impact weight-bearing exercises can also help keep bones strong and are a safe alternative if you cannot do high-impact exercises. Examples of low-impact weight-bearing exercises are:   Using elliptical  training machines   Doing low-impact aerobics   Using stair-step machines   Fast walking on a treadmill or outside Muscle-Strengthening Exercises These exercises include activities where you move your body, a weight or some other resistance against gravity. They are also known as resistance exercises and include:   Lifting weights   Using elastic exercise bands   Using weight machines   Lifting your own body weight   Functional movements, such as standing and rising up on your toes Yoga and Pilates can also improve strength, balance and flexibility. However, certain positions may not be safe for people with osteoporosis or those at increased risk of broken bones. For example, exercises that have you bend forward may increase the chance of breaking a bone in the spine. A physical therapist should be able to help you learn which exercises are safe and appropriate for you. Non-Impact Exercises Non-impact exercises can help you to improve balance, posture and how well you move in everyday activities. These exercises can also help to increase muscle strength and decrease the risk of falls and broken bones. Some of these exercises include:   Balance exercises that strengthen your legs and test your balance, such as Tai Chi, can decrease your risk of falls.   Posture exercises that improve your posture and reduce rounded or "sloping" shoulders can help you decrease the chance of breaking a bone, especially in the spine.   Functional exercises that improve how well you move can help you  with everyday activities and decrease your chance of falling and breaking a bone. For example, if you have trouble getting up from a chair or climbing stairs, you should do these activities as exercises. A physical therapist can teach you balance, posture and functional exercises. Starting a New Exercise Program If you haven't exercised regularly for a while, check with your healthcare provider before beginning a new exercise program-particularly if you have health problems such as heart disease, diabetes or high blood pressure. If you're at high risk of breaking a bone, you should work with a physical therapist to develop a safe exercise program. Once you have your healthcare provider's approval, start slowly. If you've already broken bones in the spine because of osteoporosis, be very careful to avoid activities that require reaching down, bending forward, rapid twisting motions, heavy lifting and those that increase your chance of a fall. As you get started, your muscles may feel sore for a day or two after you exercise. If soreness lasts longer, you may be working too hard and need to ease up. Exercises should be done in a pain-free range of motion. How Much Exercise Do You Need? Weight-bearing exercises 30 minutes on most days of the week. Do a 30-minutesession or multiple sessions spread out throughout the day. The benefits to your bones are the same.   Muscle-strengthening exercises Two to three days per week. If you don't have much time for strengthening/resistance training, do small amounts at a time. You can do just one body part each day. For example do arms one day, legs the next and trunk the next. You can also spread these exercises out during your normal day.  Balance, posture and functional exercises Every day or as often as needed. You may want to focus on one area more than the others. If you have fallen or lose your balance, spend time doing balance exercises. If you are getting rounded  shoulders, work more on posture exercises. If you have trouble climbing stairs or getting  up from the couch, do more functional exercises. You can also perform these exercises at one time or spread them during your day. Work with a phyiscal therapist to learn the right exercises for you.

## 2013-01-03 NOTE — Progress Notes (Signed)
Patient ID: Nicole Wood, female   DOB: 1945/09/21, 67 y.o.   MRN: [id number]   HPI  Nicole Wood is a 66 y.o.-year-old female, referred by her PCP, Dr. Fabian Sharp, for  Management of osteoporosis.  Pt was dx with OP in 2011. She saw a rheumatologist 1 year ago and r/o for RA. At thatt time, she had Xrays that showed osteopenia and OA of Left hip and low back pain.   I reviewed pt's DEXA scans: Date L1-L4 T score FN T score 33% distal Radius FRAX  12/06/2012 Not calc. L: -2.7*; R: -2.4 n/a Major OP fx: 23.4%; Hip fx 5.9%  07/31/2009 0.2 L: -2.5; R: -2.3 n/a   However, spine BMD not accurate 2/2 osteophytes.  She denies any fractures but had several falls 2/2 flip flops. No dizziness/vertigo/orthostasis.  No h/o hyper/hypocalcemia. No h/o hyperparathyroidism. No h/o kidney stones. Lab Results  Component Value Date   PTH 13.0* 07/29/2009   CALCIUM 9.5 12/04/2012   CALCIUM 9.3 03/06/2012   CALCIUM 9.3 12/12/2011   CALCIUM 9.4 12/14/2010   CALCIUM 9.2 07/29/2009   CALCIUM 9.4 07/28/2009   No h/o thyrotoxicosis. Reviewed TSH recent levels:  Lab Results  Component Value Date   TSH 3.05 12/04/2012   TSH 2.72 12/12/2011   TSH 2.67 12/14/2010   TSH 2.13 07/28/2009   Last vit D level was 31 in 12/14/2010. She was dx with vit D def >> was on high dose vit D >> could not tolerate it 2/2 irritability/aggressiveness. No recent level.  No h/o CKD. Last BUN/Cr: Lab Results  Component Value Date   BUN 12 12/04/2012   CREATININE 0.7 12/04/2012   Pt is on calcium and vitamin D = citracal, 2x a day, 400 mg-500 IU, not in last 2 weeks as she was sick; also MVI prn, not now. She also eats dairy: yoghurt, skim milk, and green, leafy, vegetables.  Walks and swims 4-5x a week (weight bearing exercise), [facility] Used to walk 7000 steps, but >> hip pain. She does not take high vitamin A doses. Not taking steroids.  She has been going to nutritionist since 2003.   She has not been on OP  treatments.  Pt does have a FH of osteoporosis: hip fx in mother at 83 y/o.   Menopause was at 24 y/o (hysterectomy + unilateral SO) - was on Provera.   ROS: Constitutional: no weight gain/loss, no fatigue, no subjective hyperthermia/hypothermia Eyes: no blurry vision, no xerophthalmia ENT: no sore throat, no nodules palpated in throat, no dysphagia/odynophagia, no hoarseness Cardiovascular: no CP/SOB/palpitations/leg swelling Respiratory: no cough/SOB Gastrointestinal: no N/V/D/C Musculoskeletal: no muscle/+joint aches: hip and low back pain Skin: no rashes Neurological: no tremors/numbness/tingling/dizziness Psychiatric: no depression/anxiety  Past Medical History  Diagnosis Date  . Depression     in the past  . Cystocele   . Hematuria     idiopathic  . Urinary incontinence   . Basal cell cancer     nose  . Osteopenia     dexa 2009, -2.3 osteoporosis -2.5 hip 2011  . Osteoarthritis    Past Surgical History  Procedure Laterality Date  . Bladder tack    . Urethral dilation    . Rt shoulder surgery   2004    [place]  . Abdominal hysterectomy      fibroids and cyst right ovary left oopherectomy  . Basal cell ca removed     History   Social History  . Marital Status:  Married    Spouse Name: N/A    Number of Children: 4   Occupational History  . retired   Social History Main Topics  . Smoking status: Never Smoker   . Smokeless tobacco: Not on file  . Alcohol Use: Yes  . Drug Use: No  . Sexual Activity: Yes    Partners: Female   Social History Narrative   Retired Diplomatic Services operational officer) some college   Married 4 children [place], and [place], and [place] Husband cancer MM. NHL in remission   Regular exercise- yes   HH of 2   No pets    Multiple myeloma and non hogkins lymphoma in husband x 6 years   Regular exercise: walk, swim, some biking   Caffeine use: coffee daily, sweet tea   Current Outpatient Prescriptions on File Prior to Visit  Medication Sig  Dispense Refill  . calcium-vitamin D (OSCAL WITH D) 500-200 MG-UNIT per tablet Take 1 tablet by mouth daily.        Marland Kitchen HYDROcodone-homatropine (HYCODAN) 5-1.5 MG/5ML syrup Take 5 mLs by mouth every 4 (four) hours as needed for cough.  180 mL  0  . lisinopril (PRINIVIL,ZESTRIL) 10 MG tablet TAKE 1 TABLET (10 MG TOTAL) BY MOUTH DAILY. MAY INCREASE TO 2 PER DAY AS DIRECTED  90 tablet  3  . naproxen sodium (ANAPROX) 220 MG tablet Take 220 mg by mouth 2 (two) times daily.      . cyclobenzaprine (FLEXERIL) 10 MG tablet Take 10 mg by mouth at bedtime as needed. Taking half tab       No current facility-administered medications on file prior to visit.   Allergies  Allergen Reactions  . Contrast Media [Iodinated Diagnostic Agents]     Gums got numb after a procedure  . Penicillins    Family History  Problem Relation Age of Onset  . Pulmonary embolism Mother   . Depression Mother     major depressive disorder  . Cancer Father     bladder  . Diabetes Brother   . Hyperlipidemia Brother   . Arthritis Brother   . Arthritis Brother     DJD  . Hip fracture Mother     about 42    PE: BP 104/68  Pulse 75  Temp(Src) 98.1 F (36.7 C) (Oral)  Resp 10  Ht 5' 4.25" (1.632 m)  Wt 126 lb (57.153 kg)  BMI 21.46 kg/m2  SpO2 98% Wt Readings from Last 3 Encounters:  01/03/13 126 lb (57.153 kg)  12/29/12 129 lb (58.514 kg)  12/04/12 126 lb (57.153 kg)   Constitutional: normal weight, in NAD. No kyphosis. Eyes: PERRLA, EOMI, no exophthalmos ENT: moist mucous membranes, no thyromegaly, no cervical lymphadenopathy Cardiovascular: RRR, No MRG Respiratory: CTA B Gastrointestinal: abdomen soft, NT, ND, BS+ Musculoskeletal: no deformities, strength intact in all 4 Skin: moist, warm, no rashes Neurological: no tremor with outstretched hands, DTR normal in all 4  Assessment: 1. Osteoporosis  Plan: 1. 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  - We reviewed her DEXA scan report together with her and her husband, and I explained that based on the T scores, she has an increased risk for fractures.  - We discussed about the different medication classes, expected benefits and side effects (including atypical fractures  and ONJ - no dental workup in progress or planned); she is a candidate for oral bisphosphonates since no GERD or CKD. IV bisphosphonate (zoledronic acid = Reclast) or sq denosumab (Prolia) are also options but would not start with them. I don't think we need Teriparatide at the moment. I explained the bisphosphonates mechanism of action.  - we reviewed her dietary and supplemental calcium and vitamin D intake, which I believe are adequate. Pt is getting 1000 units vitamin D at least 800 mg of calcium daily only from supplements. I advised her to continue this - given her specific instructions about food sources for these - see pt instructions  - discussed fall precautions - also given handout - given handout from [facility] Re: weight bearing exercises - advised to do this every day or at least 5/7 days - we discussed about different possible directions for management. I advised her to avoid a diet low in proteins since this can exacerbate osteoporosis. She does not smoke and drinks <2 drinks of alcohol a day. - We will check the following tests:  Vitamin D - Pt would like to think about starting a Bisphosphonate >> she will let me know through myChart. I explained that another choice would be to maximize weightbearing exercises and repeat DEXA in 1 year, but this would not be my first choice - will see pt back in a year  Office Visit on 01/03/2013  Component Date Value Range Status  . Vit D, 25-Hydroxy 01/03/2013 32  30 - 89 ng/mL Final   Comment: This  assay accurately quantifies Vitamin D, which is the sum of the                          25-Hydroxy forms of Vitamin D2 and D3.  Studies have shown that the                          optimum concentration of 25-Hydroxy Vitamin D is 30 ng/mL or higher.                           Concentrations of Vitamin D between 20 and 29 ng/mL are considered to                          be insufficient and concentrations less than 20 ng/mL are considered                          to be deficient for Vitamin D.   I advised patient to either add 1000 units of over-the-counter vitamin D daily, or optimize her diet to include about the same amount. I am still waiting for her decision about bisphosphonates.  01/15/2013 Pt did not decide Re: bisphosphonates yet. I will addend the results when she lets me know about her decision

## 2013-01-04 LAB — VITAMIN D 25 HYDROXY (VIT D DEFICIENCY, FRACTURES): Vit D, 25-Hydroxy: 32 ng/mL (ref 30–89)

## 2013-01-16 ENCOUNTER — Ambulatory Visit: Payer: Medicare Other

## 2013-02-13 ENCOUNTER — Ambulatory Visit
Admission: RE | Admit: 2013-02-13 | Discharge: 2013-02-13 | Disposition: A | Discharge status: Home / Self Care | Payer: Medicare Other | Source: Ambulatory Visit

## 2013-02-13 DIAGNOSIS — Z1231 Encounter for screening mammogram for malignant neoplasm of breast: Secondary | ICD-10-CM | POA: Diagnosis not present

## 2013-03-19 ENCOUNTER — Encounter: Payer: Medicare Other | Admitting: Gastroenterology

## 2013-04-20 DIAGNOSIS — R079 Chest pain, unspecified: Secondary | ICD-10-CM | POA: Diagnosis not present

## 2013-04-20 DIAGNOSIS — R002 Palpitations: Secondary | ICD-10-CM | POA: Diagnosis not present

## 2013-04-20 DIAGNOSIS — I10 Essential (primary) hypertension: Secondary | ICD-10-CM | POA: Diagnosis not present

## 2013-04-20 DIAGNOSIS — Z91041 Radiographic dye allergy status: Secondary | ICD-10-CM | POA: Diagnosis not present

## 2013-04-20 DIAGNOSIS — T6191XA Toxic effect of unspecified seafood, accidental (unintentional), initial encounter: Secondary | ICD-10-CM | POA: Diagnosis not present

## 2013-04-20 DIAGNOSIS — Z880 Allergy status to penicillin: Secondary | ICD-10-CM | POA: Diagnosis not present

## 2013-05-08 DIAGNOSIS — H6120 Impacted cerumen, unspecified ear: Secondary | ICD-10-CM | POA: Diagnosis not present

## 2013-05-08 DIAGNOSIS — H902 Conductive hearing loss, unspecified: Secondary | ICD-10-CM | POA: Diagnosis not present

## 2013-09-25 DIAGNOSIS — M5137 Other intervertebral disc degeneration, lumbosacral region: Secondary | ICD-10-CM | POA: Diagnosis not present

## 2013-09-25 DIAGNOSIS — M25559 Pain in unspecified hip: Secondary | ICD-10-CM | POA: Diagnosis not present

## 2013-10-02 DIAGNOSIS — M25559 Pain in unspecified hip: Secondary | ICD-10-CM | POA: Diagnosis not present

## 2013-12-17 ENCOUNTER — Ambulatory Visit: Payer: Medicare Other | Admitting: Internal Medicine

## 2013-12-19 ENCOUNTER — Ambulatory Visit (INDEPENDENT_AMBULATORY_CARE_PROVIDER_SITE_OTHER): Payer: Medicare Other

## 2013-12-19 DIAGNOSIS — Z23 Encounter for immunization: Secondary | ICD-10-CM

## 2013-12-30 ENCOUNTER — Other Ambulatory Visit: Payer: Self-pay | Admitting: Internal Medicine

## 2013-12-30 NOTE — Telephone Encounter (Signed)
Patient has appt on 01/09/14 and asking for 240 tabs.  Please advise.  Thanks!

## 2014-01-01 NOTE — Telephone Encounter (Signed)
Appears to be overdue for lab monitoring  Has fu appt can refill x 1 month until then

## 2014-01-02 ENCOUNTER — Ambulatory Visit: Payer: Medicare Other | Admitting: Internal Medicine

## 2014-01-02 NOTE — Telephone Encounter (Signed)
Sent to the pharmacy by e-scribe. 

## 2014-01-09 ENCOUNTER — Encounter: Payer: Self-pay | Admitting: Internal Medicine

## 2014-01-09 ENCOUNTER — Ambulatory Visit (INDEPENDENT_AMBULATORY_CARE_PROVIDER_SITE_OTHER): Payer: Medicare Other | Admitting: Internal Medicine

## 2014-01-09 VITALS — BP 160/82 | Temp 97.9°F | Ht 64.0 in | Wt 138.1 lb

## 2014-01-09 DIAGNOSIS — F439 Reaction to severe stress, unspecified: Secondary | ICD-10-CM

## 2014-01-09 DIAGNOSIS — Z658 Other specified problems related to psychosocial circumstances: Secondary | ICD-10-CM | POA: Diagnosis not present

## 2014-01-09 DIAGNOSIS — E7800 Pure hypercholesterolemia, unspecified: Secondary | ICD-10-CM

## 2014-01-09 DIAGNOSIS — I10 Essential (primary) hypertension: Secondary | ICD-10-CM

## 2014-01-09 DIAGNOSIS — M549 Dorsalgia, unspecified: Secondary | ICD-10-CM | POA: Diagnosis not present

## 2014-01-09 DIAGNOSIS — R002 Palpitations: Secondary | ICD-10-CM

## 2014-01-09 LAB — CBC WITH DIFFERENTIAL/PLATELET
Basophils Absolute: 0 10*3/uL (ref 0.0–0.1)
Basophils Relative: 0.5 % (ref 0.0–3.0)
EOS ABS: 0.2 10*3/uL (ref 0.0–0.7)
Eosinophils Relative: 3.2 % (ref 0.0–5.0)
HCT: 39.2 % (ref 36.0–46.0)
Hemoglobin: 12.8 g/dL (ref 12.0–15.0)
Lymphocytes Relative: 30.1 % (ref 12.0–46.0)
Lymphs Abs: 2.3 10*3/uL (ref 0.7–4.0)
MCHC: 32.7 g/dL (ref 30.0–36.0)
MCV: 94.7 fl (ref 78.0–100.0)
MONO ABS: 0.6 10*3/uL (ref 0.1–1.0)
Monocytes Relative: 8.5 % (ref 3.0–12.0)
NEUTROS PCT: 57.7 % (ref 43.0–77.0)
Neutro Abs: 4.4 10*3/uL (ref 1.4–7.7)
PLATELETS: 316 10*3/uL (ref 150.0–400.0)
RBC: 4.14 Mil/uL (ref 3.87–5.11)
RDW: 13.3 % (ref 11.5–15.5)
WBC: 7.6 10*3/uL (ref 4.0–10.5)

## 2014-01-09 LAB — BASIC METABOLIC PANEL
BUN: 18 mg/dL (ref 6–23)
CO2: 20 meq/L (ref 19–32)
CREATININE: 0.8 mg/dL (ref 0.4–1.2)
Calcium: 10 mg/dL (ref 8.4–10.5)
Chloride: 101 mEq/L (ref 96–112)
GFR: 77.86 mL/min (ref >60.00)
Glucose, Bld: 99 mg/dL (ref 70–99)
Potassium: 5.2 mEq/L — ABNORMAL HIGH (ref 3.5–5.1)
Sodium: 137 mEq/L (ref 135–145)

## 2014-01-09 LAB — LIPID PANEL
Cholesterol: 220 mg/dL — ABNORMAL HIGH (ref 0–200)
HDL: 50.3 mg/dL
LDL Cholesterol: 139 mg/dL — ABNORMAL HIGH (ref 0–99)
NonHDL: 169.7
Total CHOL/HDL Ratio: 4
Triglycerides: 156 mg/dL — ABNORMAL HIGH (ref 0.0–149.0)
VLDL: 31.2 mg/dL (ref 0.0–40.0)

## 2014-01-09 LAB — HEPATIC FUNCTION PANEL
ALBUMIN: 3.9 g/dL (ref 3.5–5.2)
ALT: 11 U/L (ref 0–35)
AST: 14 U/L (ref 0–37)
Alkaline Phosphatase: 76 U/L (ref 39–117)
BILIRUBIN TOTAL: 0.4 mg/dL (ref 0.2–1.2)
Bilirubin, Direct: 0 mg/dL (ref 0.0–0.3)
Total Protein: 7.3 g/dL (ref 6.0–8.3)

## 2014-01-09 LAB — T3, FREE: T3, Free: 3 pg/mL (ref 2.3–4.2)

## 2014-01-09 LAB — TSH: TSH: 1.45 u[IU]/mL (ref 0.35–4.50)

## 2014-01-09 LAB — T4, FREE: FREE T4: 1.11 ng/dL (ref 0.60–1.60)

## 2014-01-09 MED ORDER — LORAZEPAM 0.5 MG PO TABS: 0.5000 mg | ORAL_TABLET | Freq: Three times a day (TID) | ORAL | Status: DC | PRN

## 2014-01-09 NOTE — Patient Instructions (Signed)
This could be anxiety  But need cardiology evaluation to be sure these palpitations are benign . Consideration of event monitor or other.  Will notify you  of labs when available. Take blood pressure readings twice a day for 7- 10 days and then periodically .To ensure below 140/90   .Send in readings      Up in office . Small amount of anxiety  Med if needed for panic.  Plan fu  appt after cardiology appt.    Advise seeing counselor .about the situation.

## 2014-01-09 NOTE — Progress Notes (Signed)
Pre visit review using our clinic review tool, if applicable. No additional management support is needed unless otherwise documented below in the visit note.   Chief Complaint  Patient presents with  . BP Check  . Palpitations  . Stress    HPI: Nicole Wood 68 y.o.  Comes in for follow-up of her blood pressure but actually has a number of issues to discuss.  She traveled to [place] on vacation last February and had a couple episodes that were severe and worrisome to her described as pounding rapid heart rate  Without associated symptoms but made her feel bad. She's had some baseline occasional jumpy heart but nothing like this.b Took 1/2 of husbands   Xanax    Both occurred after  Grouper sandwich  awaken also from that.  Was on vacation...one episode was severe andnocturnal    Palpitations.  Was seen in ed  As per husband and   Hr was less than 133 .no pain . No assoc sx.   Had ekg  Blood work and tx Market researcher . Was ok .   bp up and then came down.  Last feb. None since then .   No recurrence   Of the severe episodes but does have intermittentflutter at times .   Quick and not long  lasting . Lots of stress all summer . Family etc.  Husband cancer  [place] son  Help.   Back and hip pain . Not resting well. And not exerciseing. The sports medicineSome aleve   meloxicam for  Hip pain  ROS: See pertinent positives and negatives per HPI.current chest pain shortness of breath syncope doesn't like to take medicines no major vision changes or unusual bleeding.  Past Medical History  Diagnosis Date  . Depression     in the past  . Cystocele   . Hematuria     idiopathic  . Urinary incontinence   . Basal cell cancer     nose  . Osteopenia     dexa 2009, -2.3 osteoporosis -2.5 hip 2011  . Osteoarthritis     Family History  Problem Relation Age of Onset  . Pulmonary embolism Mother   . Depression Mother     major depressive disorder  . Cancer Father     bladder  . Diabetes Brother   .  Hyperlipidemia Brother   . Arthritis Brother   . Arthritis Brother     DJD  . Hip fracture Mother     about 47     History   Social History  . Marital Status: Married    Spouse Name: N/A    Number of Children: N/A  . Years of Education: N/A   Social History Main Topics  . Smoking status: Never Smoker   . Smokeless tobacco: None  . Alcohol Use: Yes  . Drug Use: No  . Sexual Activity:    Partners: Female   Other Topics Concern  . None   Social History Narrative   Retired Arts development officer) some college   Married 4 children [place] char[place],, Midwife, and [place], and [place] Husband cancer MM. NHL in remission   Regular exercise- yes   HH of 2   No pets    Multiple myeloma and non hogkins lymphoma in husband x 6 years   Regular exercise: walk, swim, some biking   Caffeine use: coffee daily, sweet tea    Outpatient Encounter Prescriptions as of 01/09/2014  Medication Sig  . calcium-vitamin D (OSCAL WITH D) 500-200  MG-UNIT per tablet Take 1 tablet by mouth daily.    . cyclobenzaprine (FLEXERIL) 10 MG tablet Take 10 mg by mouth at bedtime as needed. Taking half tab  . lisinopril (PRINIVIL,ZESTRIL) 10 MG tablet TAKE 1 TABLET BY MOUTH DAILY, MAY INCREASE TO 2 TABLETS DAILY AS DIRECTED  . meloxicam (MOBIC) 15 MG tablet   . Multiple Vitamins-Minerals (ICAPS PO) Take 2 capsules by mouth daily.  . naproxen sodium (ANAPROX) 220 MG tablet Take 220 mg by mouth 2 (two) times daily.  Marland Kitchen LORazepam (ATIVAN) 0.5 MG tablet Take 1 tablet (0.5 mg total) by mouth every 8 (eight) hours as needed for anxiety.  . [DISCONTINUED] HYDROcodone-homatropine (HYCODAN) 5-1.5 MG/5ML syrup Take 5 mLs by mouth every 4 (four) hours as needed for cough.    EXAM:  BP 160/82 mmHg  Temp(Src) 97.9 F (36.6 C) (Oral)  Ht 5' 4" (1.626 m)  Wt 138 lb 1.6 oz (62.642 kg)  BMI 23.69 kg/m2  Body mass index is 23.69 kg/(m^2).  GENERAL: vitals reviewed and listed above, alert, oriented, appears well hydrated and in no acute  distressshe appears mildly anxious cognitively intact prefers to stand than sit because of her back HEENT: atraumatic, conjunctiva  clear, no obvious abnormalities on inspection of external nose and ears OP : no lesion edema or exudate  NECK: no obvious masses on inspection palpation  LUNGS: clear to auscultation bilaterally, no wheezes, rales or rhonchi, good air movement CV: HRRR,rate about 90 no clubbing cyanosis or  peripheral edema nl cap refill her blood pressure is elevated on repeat Abdomen soft without organomegaly guarding or rebound MS: moves all extremities without noticeable focal  abnormality PSYCH: pleasant and cooperative, no obvious depression some  anxiety very talkative cognition intact .  Neurologic is grossly normal Lab Results  Component Value Date   WBC 6.6 12/04/2012   HGB 12.3 12/04/2012   HCT 36.7 12/04/2012   PLT 276.0 12/04/2012   GLUCOSE 89 12/04/2012   CHOL 178 12/04/2012   TRIG 84.0 12/04/2012   HDL 52.50 12/04/2012   LDLDIRECT 137.2 12/14/2010   LDLCALC 109* 12/04/2012   ALT 9 12/04/2012   AST 14 12/04/2012   NA 142 12/04/2012   K 5.1 12/04/2012   CL 106 12/04/2012   CREATININE 0.7 12/04/2012   BUN 12 12/04/2012   CO2 31 12/04/2012   TSH 3.05 12/04/2012    ASSESSMENT AND PLAN:  Discussed the following assessment and plan:  Intermittent palpitations - Plan: Basic metabolic panel, CBC with Differential, Hepatic function panel, TSH, T4, free, T3, free, Lipid panel, Ambulatory referral to Cardiology  Essential hypertension - Plan: Basic metabolic panel, CBC with Differential, Hepatic function panel, TSH, T4, free, T3, free, Lipid panel, Ambulatory referral to Cardiology  Bilateral back pain, unspecified location - Plan: Basic metabolic panel, CBC with Differential, Hepatic function panel, TSH, T4, free, T3, free, Lipid panel  Stress - Plan: Basic metabolic panel, CBC with Differential, Hepatic function panel, TSH, T4, free, T3, free, Lipid  panel  Elevated cholesterol - Plan: Basic metabolic panel, CBC with Differential, Hepatic function panel, TSH, T4, free, T3, free, Lipid panel She has had her blood pressure monitored in the past and has been normal at home elevated when she comes to the doctor. States that her blood pressure was normal when she left the emergency room but was elevated when she entered. Her palpitation episodes sound severe certainly could be anxiety related to finish but would prefer cardiology consult to settle the issue of whether  she's having benign palpitations or something more important. We'll do cardiology consult referral Small amount of Ativan if needed. He is known to have white coat hypertension but I don't like the severity of the elevation and she will recheck her readings and send them in on my chart. A need to add another medicine if appropriate Lab blood tests today  Due anyway  Plan follow-up after cardiology evaluation. Advise she get back with her counselor  For strategies as she goes through all these stressful responsibilities. Did not do an EKG today because it was done reportedly in the emergency room 9 months ago and I'm doing a referral today anyway -Patient advised to return or notify health care team  if symptoms worsen ,persist or new concerns arise.  Patient Instructions  This could be anxiety  But need cardiology evaluation to be sure these palpitations are benign . Consideration of event monitor or other.  Will notify you  of labs when available. Take blood pressure readings twice a day for 7- 10 days and then periodically .To ensure below 140/90   .Send in readings      Up in office . Small amount of anxiety  Med if needed for panic.  Plan fu  appt after cardiology appt.    Advise seeing counselor .about the situation.     Standley Brooking. Panosh M.D.  Total visit 40 mins > 50% spent counseling and coordinating care

## 2014-01-13 ENCOUNTER — Other Ambulatory Visit: Payer: Self-pay | Admitting: Family Medicine

## 2014-01-13 DIAGNOSIS — E875 Hyperkalemia: Secondary | ICD-10-CM

## 2014-01-15 ENCOUNTER — Telehealth: Payer: Self-pay | Admitting: Internal Medicine

## 2014-01-15 NOTE — Telephone Encounter (Signed)
emmi emailed °

## 2014-01-20 ENCOUNTER — Other Ambulatory Visit (INDEPENDENT_AMBULATORY_CARE_PROVIDER_SITE_OTHER): Payer: Medicare Other

## 2014-01-20 DIAGNOSIS — E875 Hyperkalemia: Secondary | ICD-10-CM | POA: Diagnosis not present

## 2014-01-20 LAB — BASIC METABOLIC PANEL
BUN: 18 mg/dL (ref 6–23)
CHLORIDE: 101 meq/L (ref 96–112)
CO2: 26 mEq/L (ref 19–32)
Calcium: 9 mg/dL (ref 8.4–10.5)
Creatinine, Ser: 0.8 mg/dL (ref 0.4–1.2)
GFR: 73.49 mL/min (ref >60.00)
Glucose, Bld: 71 mg/dL (ref 70–99)
POTASSIUM: 3.8 meq/L (ref 3.5–5.1)
SODIUM: 136 meq/L (ref 135–145)

## 2014-01-22 ENCOUNTER — Encounter: Payer: Self-pay | Admitting: Family Medicine

## 2014-02-03 ENCOUNTER — Other Ambulatory Visit: Payer: Self-pay

## 2014-02-03 DIAGNOSIS — Z1231 Encounter for screening mammogram for malignant neoplasm of breast: Secondary | ICD-10-CM

## 2014-02-10 NOTE — Telephone Encounter (Signed)
Pt sent in BP readings  Pulsed in 70 and 80s  18 readings since last visit and all at goal most  Are between 543 and 606 systolic and 60 - 70 diastolic.   WKP.

## 2014-02-11 ENCOUNTER — Institutional Professional Consult (permissible substitution): Payer: Medicare Other | Admitting: Cardiology

## 2014-02-11 ENCOUNTER — Ambulatory Visit (INDEPENDENT_AMBULATORY_CARE_PROVIDER_SITE_OTHER): Payer: Medicare Other | Admitting: Cardiology

## 2014-02-11 ENCOUNTER — Encounter: Payer: Self-pay | Admitting: Cardiology

## 2014-02-11 VITALS — BP 142/80 | HR 96 | Ht 64.0 in | Wt 138.0 lb

## 2014-02-11 DIAGNOSIS — I10 Essential (primary) hypertension: Secondary | ICD-10-CM

## 2014-02-11 DIAGNOSIS — R002 Palpitations: Secondary | ICD-10-CM | POA: Diagnosis not present

## 2014-02-11 MED ORDER — METOPROLOL SUCCINATE ER 25 MG PO TB24: 25.0000 mg | ORAL_TABLET | Freq: Every day | ORAL | Status: DC

## 2014-02-11 NOTE — Patient Instructions (Signed)
Please take Metoprolol XL 25 mg as needed once a day. Continue all other medications as listed.  Follow up in February 2016.

## 2014-02-11 NOTE — Progress Notes (Signed)
[facility]. [address] [place], [place]  [address] Phone: [phone] Fax:  [phone]  Date:  02/11/2014   ID:  Nicole Wood, DOB 11-01-1945, MRN [id number]  PCP:  Lottie Dawson, MD   History of Present Illness: Nicole Wood is a 68 y.o. female here for evaluation of palpitations. He describes it as "jumpy heart". She was seen in the emergency department. Labile blood pressure. Has been under increased stress over summer.  In Feb 2015, Husband and she went to [facility] for 3 weeks. Had 2 episodes of palpitaions. Mon, Fri. Each time ate grouper. First time while at hotel before sleep, relaxing, felt pounding heart. Feels like she has to get up and walk around. Lasted 3-5 min. Took one of husband's Xanax (On CHEMO). Did fine all week.  On Friday, evening, 7pm went to dinner, grouper sandwich. Glass of wine prior to leaving. Came home, felt very miserable and full. Got in bed, took TUMS, bloated. No Xanax that time. Breathing was fine. At 2am heart bolted her out bed. Walked around, took deep breath, no CP, no diaphoesis. Did not wake Ruthann Cancer her husband up right away. Took Xanax. Scared. Calmed down. Finally went to sleep. Anxious.   Left Saturday went to [place]. Her husband wanted her to go to ER. 2 hr drive. Checked into hotel, heart started racing. Went to ER. HR was 133bpm. Tele. Looked good. Blood work and CXR OK. Grandson mental break.   ER MD talked about toxins in fish.    Wt Readings from Last 3 Encounters:  02/11/14 138 lb (62.596 kg)  01/09/14 138 lb 1.6 oz (62.642 kg)  01/03/13 126 lb (57.153 kg)     Past Medical History  Diagnosis Date  . Depression     in the past  . Cystocele   . Hematuria     idiopathic  . Urinary incontinence   . Basal cell cancer     nose  . Osteopenia     dexa 2009, -2.3 osteoporosis -2.5 hip 2011  . Osteoarthritis     Past Surgical History  Procedure Laterality Date  . Bladder tack    . Urethral dilation    . Rt  shoulder surgery   2004    [place]  . Abdominal hysterectomy      fibroids and cyst right ovary left oopherectomy  . Basal cell ca removed      Current Outpatient Prescriptions  Medication Sig Dispense Refill  . calcium-vitamin D (OSCAL WITH D) 500-200 MG-UNIT per tablet Take 1 tablet by mouth daily.      . cyclobenzaprine (FLEXERIL) 10 MG tablet Take 10 mg by mouth at bedtime as needed. Taking half tab    . lisinopril (PRINIVIL,ZESTRIL) 10 MG tablet TAKE 1 TABLET BY MOUTH DAILY, MAY INCREASE TO 2 TABLETS DAILY AS DIRECTED 240 tablet 0  . LORazepam (ATIVAN) 0.5 MG tablet Take 1 tablet (0.5 mg total) by mouth every 8 (eight) hours as needed for anxiety. 24 tablet 0  . meloxicam (MOBIC) 15 MG tablet     . Multiple Vitamins-Minerals (ICAPS PO) Take 2 capsules by mouth daily.    . naproxen sodium (ANAPROX) 220 MG tablet Take 220 mg by mouth 2 (two) times daily.     No current facility-administered medications for this visit.    Allergies:    Allergies  Allergen Reactions  . Contrast Media [Iodinated Diagnostic Agents]     Gums got numb after a procedure  .  Penicillins     Social History:  The patient  reports that she has never smoked. She does not have any smokeless tobacco history on file. She reports that she drinks alcohol. She reports that she does not use illicit drugs.   Family History  Problem Relation Age of Onset  . Pulmonary embolism Mother   . Depression Mother     major depressive disorder  . Cancer Father     bladder  . Diabetes Brother   . Hyperlipidemia Brother   . Arthritis Brother   . Arthritis Brother     DJD  . Hip fracture Mother     about 51     ROS:  Please see the history of present illness.   Denies any fevers, chills, orthopnea, PND, syncope   All other systems reviewed and negative.   PHYSICAL EXAM: VS:  BP 142/80 mmHg  Pulse 96  Ht 5\' 4"  (1.626 m)  Wt 138 lb (62.596 kg)  BMI 23.68 kg/m2 Well nourished, well developed, in no acute  distress HEENT: normal, [place]/AT, EOMI Neck: no JVD, normal carotid upstroke, no bruit Cardiac:  normal S1, S2; RRR; no murmur Lungs:  clear to auscultation bilaterally, no wheezing, rhonchi or rales Abd: soft, nontender, no hepatomegaly, no bruits Ext: no edema, 2+ distal pulses Skin: warm and dry GU: deferred Neuro: no focal abnormalities noted, AAO x 3  EKG:  02/11/14-normal sinus rhythm, 96, no other abnormalities     ASSESSMENT AND PLAN:  1. Palpitations-Toprol-XL 25 mg once a day when necessary. If increases frequency and intensity, we will have low threshold for an event monitor. discussed the potential for atrial fibrillation. Hopefully, she is only having premature ventricular contractions with associated anxiety causing sinus tachycardia as was seen in the emergency department. Ativan is reasonable as well as. No heart murmurs, no syncope, no anginal symptoms. No signs of heart failure. We will continue to monitor clinically. Electrolytes and TSH are normal.  2.  I will see back in February.  Signed, Candee Furbish, MD [facility]  02/11/2014 3:38 PM

## 2014-02-20 ENCOUNTER — Ambulatory Visit
Admission: RE | Admit: 2014-02-20 | Discharge: 2014-02-20 | Disposition: A | Discharge status: Home / Self Care | Payer: Medicare Other | Source: Ambulatory Visit

## 2014-02-20 DIAGNOSIS — Z1231 Encounter for screening mammogram for malignant neoplasm of breast: Secondary | ICD-10-CM | POA: Diagnosis not present

## 2014-03-05 ENCOUNTER — Other Ambulatory Visit: Payer: Self-pay | Admitting: Internal Medicine

## 2014-03-06 NOTE — Telephone Encounter (Signed)
Ok x 1

## 2014-03-13 NOTE — Telephone Encounter (Signed)
Did this get done

## 2014-03-18 ENCOUNTER — Ambulatory Visit: Payer: Medicare Other | Admitting: Internal Medicine

## 2014-03-19 ENCOUNTER — Encounter: Payer: Self-pay | Admitting: Internal Medicine

## 2014-03-19 ENCOUNTER — Ambulatory Visit (INDEPENDENT_AMBULATORY_CARE_PROVIDER_SITE_OTHER): Payer: Medicare Other | Admitting: Internal Medicine

## 2014-03-19 VITALS — BP 126/80 | Temp 97.6°F | Ht 64.0 in | Wt 137.3 lb

## 2014-03-19 DIAGNOSIS — Z23 Encounter for immunization: Secondary | ICD-10-CM

## 2014-03-19 DIAGNOSIS — F4322 Adjustment disorder with anxiety: Secondary | ICD-10-CM | POA: Diagnosis not present

## 2014-03-19 DIAGNOSIS — F439 Reaction to severe stress, unspecified: Secondary | ICD-10-CM

## 2014-03-19 DIAGNOSIS — I10 Essential (primary) hypertension: Secondary | ICD-10-CM

## 2014-03-19 DIAGNOSIS — R002 Palpitations: Secondary | ICD-10-CM | POA: Diagnosis not present

## 2014-03-19 DIAGNOSIS — Z658 Other specified problems related to psychosocial circumstances: Secondary | ICD-10-CM | POA: Diagnosis not present

## 2014-03-19 MED ORDER — MIRTAZAPINE 15 MG PO TABS: 15.0000 mg | ORAL_TABLET | Freq: Every day | ORAL | Status: DC

## 2014-03-19 MED ORDER — LORAZEPAM 0.5 MG PO TABS: 0.5000 mg | ORAL_TABLET | Freq: Three times a day (TID) | ORAL | Status: DC | PRN

## 2014-03-19 NOTE — Progress Notes (Signed)
Pre visit review using our clinic review tool, if applicable. No additional management support is needed unless otherwise documented below in the visit note.  Chief Complaint  Patient presents with  . Follow-up    HPI: Patient Nicole Wood  comes in today for   problem evaluation. Dealing with Palpations.  stress  bp   Can do as needed and hasn't needed this  So far  . ativan at night   recenetly  Anxiety and leg pain sometimes bothers her thinks she is doing better  And   . Still a lot of stress  ROS: See pertinent positives and negatives per HPI.travel to [place] soon   Past Medical History  Diagnosis Date  . Depression     in the past  . Cystocele   . Hematuria     idiopathic  . Urinary incontinence   . Basal cell cancer     nose  . Osteopenia     dexa 2009, -2.3 osteoporosis -2.5 hip 2011  . Osteoarthritis     Family History  Problem Relation Age of Onset  . Pulmonary embolism Mother   . Depression Mother     major depressive disorder  . Cancer Father     bladder  . Diabetes Brother   . Hyperlipidemia Brother   . Arthritis Brother   . Arthritis Brother     DJD  . Hip fracture Mother     about 28     History   Social History  . Marital Status: Married    Spouse Name: N/A    Number of Children: N/A  . Years of Education: N/A   Social History Main Topics  . Smoking status: Never Smoker   . Smokeless tobacco: None  . Alcohol Use: Yes  . Drug Use: No  . Sexual Activity:    Partners: Female   Other Topics Concern  . None   Social History Narrative   Retired Arts development officer) some college   Married 4 children [place] char[place],, Midwife, and [place], and [place] Husband cancer MM. NHL in remission   Regular exercise- yes   HH of 2   No pets    Multiple myeloma and non hogkins lymphoma in husband x 6 years   Regular exercise: walk, swim, some biking   Caffeine use: coffee daily, sweet tea    Outpatient Encounter Prescriptions as of 03/19/2014  Medication Sig    . calcium-vitamin D (OSCAL WITH D) 500-200 MG-UNIT per tablet Take 1 tablet by mouth daily.    . cyclobenzaprine (FLEXERIL) 10 MG tablet Take 10 mg by mouth at bedtime as needed. Taking half tab  . lisinopril (PRINIVIL,ZESTRIL) 10 MG tablet TAKE 1 TABLET BY MOUTH DAILY, MAY INCREASE TO 2 TABLETS DAILY AS DIRECTED  . LORazepam (ATIVAN) 0.5 MG tablet Take 1 tablet (0.5 mg total) by mouth every 8 (eight) hours as needed. for anxiety  . meloxicam (MOBIC) 15 MG tablet   . metoprolol succinate (TOPROL XL) 25 MG 24 hr tablet Take 1 tablet (25 mg total) by mouth daily.  . mirtazapine (REMERON) 15 MG tablet Take 1-2 tablets (15-30 mg total) by mouth at bedtime.  . Multiple Vitamins-Minerals (ICAPS PO) Take 2 capsules by mouth daily.  . naproxen sodium (ANAPROX) 220 MG tablet Take 220 mg by mouth 2 (two) times daily.  . [DISCONTINUED] LORazepam (ATIVAN) 0.5 MG tablet TAKE 1 TABLET BY MOUTH EVERY 8 HOURS AS NEEDED FOR ANXIETY    EXAM:  BP 126/80 mmHg  Temp(Src)  97.6 F (36.4 C) (Oral)  Ht '5\' 4"'  (1.626 m)  Wt 137 lb 4.8 oz (62.279 kg)  BMI 23.56 kg/m2  Body mass index is 23.56 kg/(m^2).  GENERAL: vitals reviewed and listed above, alert, oriented, appears well hydrated and in no acute distress PSYCH: pleasant and cooperative, no obvious depression much less anxiety noted  Today  BP Readings from Last 3 Encounters:  03/19/14 126/80  02/11/14 142/80  01/09/14 160/82   Wt Readings from Last 3 Encounters:  03/19/14 137 lb 4.8 oz (62.279 kg)  02/11/14 138 lb (62.596 kg)  01/09/14 138 lb 1.6 oz (62.642 kg)      ASSESSMENT AND PLAN:  Discussed the following assessment and plan:  Intermittent palpitations  Adjustment disorder with anxious mood - affecting sleep   trial remeron  consider lexapro or other ssri if needed   Essential hypertension - better bp today   Stress  Need for vaccination with 13-polyvalent pneumococcal conjugate vaccine - Plan: Pneumococcal conjugate vaccine  13-valent Total visit 37mns > 50% spent counseling and coordinating care    -Patient advised to return or notify health care team  if symptoms worsen ,persist or new concerns arise.  Patient Instructions  Trial   remeron to suppress anxiety and may help sleep .  Use  the ativan as add on medication. Do not take with alcohol.  ROV 2-3   Months        WStandley Brooking Panosh M.D.  ASSESSMENT AND PLAN:  1. Palpitations-Toprol-XL 25 mg once a day when necessary. If increases frequency and intensity, we will have low threshold for an event monitor. discussed the potential for atrial fibrillation. Hopefully, she is only having premature ventricular contractions with associated anxiety causing sinus tachycardia as was seen in the emergency department. Ativan is reasonable as well as. No heart murmurs, no syncope, no anginal symptoms. No signs of heart failure. We will continue to monitor clinically. Electrolytes and TSH are normal.  2. I will see back in February.  Signed, MCandee Furbish MD [facility] 02/11/2014 3:38 PM

## 2014-03-19 NOTE — Patient Instructions (Signed)
Trial   remeron to suppress anxiety and may help sleep .  Use  the ativan as add on medication. Do not take with alcohol.  ROV 2-3   Months

## 2014-04-01 ENCOUNTER — Ambulatory Visit (INDEPENDENT_AMBULATORY_CARE_PROVIDER_SITE_OTHER): Payer: Medicare Other | Admitting: Cardiology

## 2014-04-01 ENCOUNTER — Encounter: Payer: Self-pay | Admitting: Cardiology

## 2014-04-01 VITALS — BP 142/80 | HR 90 | Ht 64.0 in | Wt 139.0 lb

## 2014-04-01 DIAGNOSIS — R002 Palpitations: Secondary | ICD-10-CM

## 2014-04-01 NOTE — Progress Notes (Signed)
[facility]. [address], [address] [place], [place]  [address] Phone: [phone] Fax:  [phone]  Date:  04/01/2014   ID:  Nicole Wood, DOB Jul 19, 1945, MRN [id number]  PCP:  Lottie Dawson, MD   History of Present Illness: Nicole Wood is a 69 y.o. female here for evaluation of palpitations. He describes it as "jumpy heart". She was seen in the emergency department. Labile blood pressure. Has been under increased stress over summer 2015  In Feb 2015, Husband and she went to [facility] for 3 weeks. Had 2 episodes of palpitaions. Mon, Fri. Each time ate grouper. First time while at hotel before sleep, relaxing, felt pounding heart. Feels like she has to get up and walk around. Lasted 3-5 min. Took one of husband's Xanax (On CHEMO). Did fine all week.  On Friday, evening, 7pm went to dinner, grouper sandwich. Glass of wine prior to leaving. Came home, felt very miserable and full. Got in bed, took TUMS, bloated. No Xanax that time. Breathing was fine. At 2am heart bolted her out bed. Walked around, took deep breath, no CP, no diaphoesis. Did not wake Ruthann Cancer her husband up right away. Took Xanax. Scared. Calmed down. Finally went to sleep. Anxious.   Left Saturday went to [place]. Her husband wanted her to go to ER. 2 hr drive. Checked into hotel, heart started racing. Went to ER. HR was 133bpm. Tele. Looked good. Blood work and CXR OK. Grandson mental break.   ER MD talked about toxins in fish.   04/01/14-follow-up visit-she's been doing very well without palpitations. She is eager to start exercising once again. No chest pain, no shortness of breath. She has metoprolol to be taken on as-needed basis.   Wt Readings from Last 3 Encounters:  04/01/14 139 lb (63.05 kg)  03/19/14 137 lb 4.8 oz (62.279 kg)  02/11/14 138 lb (62.596 kg)     Past Medical History  Diagnosis Date  . Depression     in the past  . Cystocele   . Hematuria     idiopathic  . Urinary incontinence   . Basal  cell cancer     nose  . Osteopenia     dexa 2009, -2.3 osteoporosis -2.5 hip 2011  . Osteoarthritis     Past Surgical History  Procedure Laterality Date  . Bladder tack    . Urethral dilation    . Rt shoulder surgery   2004    [place]  . Abdominal hysterectomy      fibroids and cyst right ovary left oopherectomy  . Basal cell ca removed      Current Outpatient Prescriptions  Medication Sig Dispense Refill  . calcium-vitamin D (OSCAL WITH D) 500-200 MG-UNIT per tablet Take 1 tablet by mouth daily.      . cyclobenzaprine (FLEXERIL) 10 MG tablet Take 10 mg by mouth at bedtime as needed. Taking half tab    . lisinopril (PRINIVIL,ZESTRIL) 10 MG tablet TAKE 1 TABLET BY MOUTH DAILY, MAY INCREASE TO 2 TABLETS DAILY AS DIRECTED 240 tablet 0  . LORazepam (ATIVAN) 0.5 MG tablet Take 1 tablet (0.5 mg total) by mouth every 8 (eight) hours as needed. for anxiety 24 tablet 0  . meloxicam (MOBIC) 15 MG tablet     . metoprolol succinate (TOPROL XL) 25 MG 24 hr tablet Take 1 tablet (25 mg total) by mouth daily. 30 tablet 6  . mirtazapine (REMERON) 15 MG tablet Take 1-2 tablets (15-30 mg total)  by mouth at bedtime. 40 tablet 1  . Multiple Vitamins-Minerals (ICAPS PO) Take 2 capsules by mouth daily.    . naproxen sodium (ANAPROX) 220 MG tablet Take 220 mg by mouth 2 (two) times daily.     No current facility-administered medications for this visit.    Allergies:    Allergies  Allergen Reactions  . Contrast Media [Iodinated Diagnostic Agents]     Gums got numb after a procedure  . Penicillins     Social History:  The patient  reports that she has never smoked. She does not have any smokeless tobacco history on file. She reports that she drinks alcohol. She reports that she does not use illicit drugs.   Family History  Problem Relation Age of Onset  . Pulmonary embolism Mother   . Depression Mother     major depressive disorder  . Cancer Father     bladder  . Diabetes Brother   .  Hyperlipidemia Brother   . Arthritis Brother   . Arthritis Brother     DJD  . Hip fracture Mother     about 67   . Heart attack Neg Hx   . Stroke Maternal Aunt   . Hypertension Mother     ROS:  Please see the history of present illness.   Denies any fevers, chills, orthopnea, PND, syncope   All other systems reviewed and negative.   PHYSICAL EXAM: VS:  BP 142/80 mmHg  Pulse 90  Ht 5\' 4"  (1.626 m)  Wt 139 lb (63.05 kg)  BMI 23.85 kg/m2  SpO2 97% Well nourished, well developed, in no acute distress HEENT: normal, [place]/AT, EOMI Neck: no JVD, normal carotid upstroke, no bruit Cardiac:  normal S1, S2; RRR; no murmur Lungs:  clear to auscultation bilaterally, no wheezing, rhonchi or rales Abd: soft, nontender, no hepatomegaly, no bruits Ext: no edema, 2+ distal pulses Skin: warm and dry GU: deferred Neuro: no focal abnormalities noted, AAO x 3  EKG:  02/11/14-normal sinus rhythm, 96, no other abnormalities     ASSESSMENT AND PLAN:  1. Palpitations-Toprol-XL 25 mg once a day when necessary. If increases frequency and intensity, we will have low threshold for an event monitor. Discussed the potential for atrial fibrillation. Hopefully, she is only having premature ventricular contractions with associated anxiety causing sinus tachycardia as was seen in the emergency department. Ativan is reasonable as well as. No heart murmurs, no syncope, no anginal symptoms. No signs of heart failure. We will continue to monitor clinically. Electrolytes and TSH are normal.  2.  I will see back in one year  Signed, Candee Furbish, MD [facility]  04/01/2014 3:30 PM

## 2014-04-08 ENCOUNTER — Other Ambulatory Visit: Payer: Self-pay | Admitting: Dermatology

## 2014-04-08 DIAGNOSIS — Z85828 Personal history of other malignant neoplasm of skin: Secondary | ICD-10-CM | POA: Diagnosis not present

## 2014-04-08 DIAGNOSIS — L819 Disorder of pigmentation, unspecified: Secondary | ICD-10-CM | POA: Diagnosis not present

## 2014-04-08 DIAGNOSIS — D1801 Hemangioma of skin and subcutaneous tissue: Secondary | ICD-10-CM | POA: Diagnosis not present

## 2014-04-08 DIAGNOSIS — D225 Melanocytic nevi of trunk: Secondary | ICD-10-CM | POA: Diagnosis not present

## 2014-04-08 DIAGNOSIS — L814 Other melanin hyperpigmentation: Secondary | ICD-10-CM | POA: Diagnosis not present

## 2014-04-08 DIAGNOSIS — L821 Other seborrheic keratosis: Secondary | ICD-10-CM | POA: Diagnosis not present

## 2014-04-08 DIAGNOSIS — L570 Actinic keratosis: Secondary | ICD-10-CM | POA: Diagnosis not present

## 2014-04-08 DIAGNOSIS — D485 Neoplasm of uncertain behavior of skin: Secondary | ICD-10-CM | POA: Diagnosis not present

## 2014-05-08 DIAGNOSIS — H3531 Nonexudative age-related macular degeneration: Secondary | ICD-10-CM | POA: Diagnosis not present

## 2014-05-08 DIAGNOSIS — H2513 Age-related nuclear cataract, bilateral: Secondary | ICD-10-CM | POA: Diagnosis not present

## 2014-05-20 ENCOUNTER — Ambulatory Visit (INDEPENDENT_AMBULATORY_CARE_PROVIDER_SITE_OTHER): Payer: Medicare Other | Admitting: Internal Medicine

## 2014-05-20 ENCOUNTER — Encounter: Payer: Self-pay | Admitting: Internal Medicine

## 2014-05-20 VITALS — BP 122/76 | Temp 97.9°F | Ht 64.0 in | Wt 136.3 lb

## 2014-05-20 DIAGNOSIS — R002 Palpitations: Secondary | ICD-10-CM

## 2014-05-20 DIAGNOSIS — Z79899 Other long term (current) drug therapy: Secondary | ICD-10-CM

## 2014-05-20 DIAGNOSIS — I10 Essential (primary) hypertension: Secondary | ICD-10-CM | POA: Diagnosis not present

## 2014-05-20 DIAGNOSIS — Z658 Other specified problems related to psychosocial circumstances: Secondary | ICD-10-CM

## 2014-05-20 DIAGNOSIS — R12 Heartburn: Secondary | ICD-10-CM | POA: Diagnosis not present

## 2014-05-20 DIAGNOSIS — F4322 Adjustment disorder with anxiety: Secondary | ICD-10-CM | POA: Diagnosis not present

## 2014-05-20 DIAGNOSIS — F439 Reaction to severe stress, unspecified: Secondary | ICD-10-CM

## 2014-05-20 DIAGNOSIS — R197 Diarrhea, unspecified: Secondary | ICD-10-CM

## 2014-05-20 NOTE — Patient Instructions (Signed)
Can try 1/2 dose of Remeron.  7.5 mg  And still too groggy then we can try something else.  There are other option  to try  Included guided imagery.  Acid reflux common  Fu if  persistent or progressive.   If diarrhea stomach not better in the next 1-2 weeks then contact us for follow up.  Blood pressure is good.   Let us know how you are doing in about a month  And wellness in fall

## 2014-05-20 NOTE — Progress Notes (Signed)
Pre visit review using our clinic review tool, if applicable. No additional management support is needed unless otherwise documented below in the visit note.  Chief Complaint  Patient presents with  . Follow-up    Had some acid reflux while in [place].  Also having some diarrhea with nausea this morning.    HPI: Nicole Wood 69 y.o. seen for stress palpitations and anxiety elev bp last visit  remeron given  " weird ed her out  "Sedation into the next day  Drugged feeling so stopped after 3 days trial   ocass benzoUses ativan   For sleep   Taking 1/2 at times Thinks has stomach bug nausea and  Diarrhea  No fever on clear liquids.pepto and diarrhea .   Diarrhea.  Today .    Stop Also had   Episode of acid reflux   New tums and otc ranitidine once helped . No fever cp sob at this time   bp has been good    ROS: See pertinent positives and negatives per HPI.no fever chills uti sx resp sx  Able to take walks no cv pulm sx  Back and hip problems interfering some with exercise  Past Medical History  Diagnosis Date  . Depression     in the past  . Cystocele   . Hematuria     idiopathic  . Urinary incontinence   . Basal cell cancer     nose  . Osteopenia     dexa 2009, -2.3 osteoporosis -2.5 hip 2011  . Osteoarthritis     Family History  Problem Relation Age of Onset  . Pulmonary embolism Mother   . Depression Mother     major depressive disorder  . Cancer Father     bladder  . Diabetes Brother   . Hyperlipidemia Brother   . Arthritis Brother   . Arthritis Brother     DJD  . Hip fracture Mother     about 47   . Heart attack Neg Hx   . Stroke Maternal Aunt   . Hypertension Mother     History   Social History  . Marital Status: Married    Spouse Name: N/A  . Number of Children: N/A  . Years of Education: N/A   Social History Main Topics  . Smoking status: Never Smoker   . Smokeless tobacco: Not on file  . Alcohol Use: Yes  . Drug Use: No  . Sexual Activity:      Partners: Female   Other Topics Concern  . None   Social History Narrative   Retired Arts development officer) some college   Married 4 children [place],, Midwife, and [place], and [place] Husband cancer MM. NHL in remission   Regular exercise- yes   HH of 2   No pets    Multiple myeloma and non hogkins lymphoma in husband x 6 years   Regular exercise: walk, swim, some biking   Caffeine use: coffee daily, sweet tea    Outpatient Encounter Prescriptions as of 05/20/2014  Medication Sig  . calcium-vitamin D (OSCAL WITH D) 500-200 MG-UNIT per tablet Take 1 tablet by mouth daily.    . cyclobenzaprine (FLEXERIL) 10 MG tablet Take 10 mg by mouth at bedtime as needed. Taking half tab  . lisinopril (PRINIVIL,ZESTRIL) 10 MG tablet TAKE 1 TABLET BY MOUTH DAILY, MAY INCREASE TO 2 TABLETS DAILY AS DIRECTED  . LORazepam (ATIVAN) 0.5 MG tablet Take 1 tablet (0.5 mg total) by mouth every 8 (eight) hours  as needed. for anxiety  . meloxicam (MOBIC) 15 MG tablet   . metoprolol succinate (TOPROL XL) 25 MG 24 hr tablet Take 1 tablet (25 mg total) by mouth daily. (Patient taking differently: Take 25 mg by mouth as needed. )  . Multiple Vitamins-Minerals (ICAPS PO) Take 2 capsules by mouth daily.  . naproxen sodium (ANAPROX) 220 MG tablet Take 220 mg by mouth 2 (two) times daily.  . [DISCONTINUED] mirtazapine (REMERON) 15 MG tablet Take 1-2 tablets (15-30 mg total) by mouth at bedtime.    EXAM:  BP 122/76 mmHg  Temp(Src) 97.9 F (36.6 C) (Oral)  Ht '5\' 4"'  (1.626 m)  Wt 136 lb 4.8 oz (61.825 kg)  BMI 23.38 kg/m2  Body mass index is 23.38 kg/(m^2).  GENERAL: vitals reviewed and listed above, alert, oriented, appears well hydrated and in no acute distress HEENT: atraumatic, conjunctiva  clear, no obvious abnormalities on inspection of external nose and ears OP : no lesion edema or exudate  NECK: no obvious masses on inspection palpation  LUNGS: clear to auscultation bilaterally, no wheezes, rales or rhonchi,  good air movement CV: HRRR, no clubbing cyanosis or  peripheral edema nl cap refill  Abdomen:  Sof,t normal bowel sounds without hepatosplenomegaly, no guarding rebound or masses no CVA tenderness feels sore tender  But no localizing sx MS: moves all extremities without noticeable focal  abnormality PSYCH: pleasant and cooperative, no obvious depression or anxiety Lab Results  Component Value Date   WBC 7.6 01/09/2014   HGB 12.8 01/09/2014   HCT 39.2 01/09/2014   PLT 316.0 01/09/2014   GLUCOSE 71 01/20/2014   CHOL 220* 01/09/2014   TRIG 156.0* 01/09/2014   HDL 50.30 01/09/2014   LDLDIRECT 137.2 12/14/2010   LDLCALC 139* 01/09/2014   ALT 11 01/09/2014   AST 14 01/09/2014   NA 136 01/20/2014   K 3.8 01/20/2014   CL 101 01/20/2014   CREATININE 0.8 01/20/2014   BUN 18 01/20/2014   CO2 26 01/20/2014   TSH 1.45 01/09/2014    ASSESSMENT AND PLAN:  Discussed the following assessment and plan:  Intermittent palpitations - better  Adjustment disorder with anxious mood - doing better  limit reg use of rescu med benzosedation with remeron 15 try 1/2 dose if not consider silenor  Essential hypertension - nl bp today .   Stress  Medication management  Diarrhea - interim illnss prob self limiting   Heartburn - episode ok to use ranitidine otc 150 bid prob gi bug expect improvement  Total visit 59mns > 50% spent counseling and coordinating care  -Patient advised to return or notify health care team  if symptoms worsen ,persist or new concerns arise.  Patient Instructions  Can try 1/2 dose of Remeron.  7.5 mg  And still too groggy then we can try something else.  There are other option  to try  Included guided imagery.  Acid reflux common  Fu if  persistent or progressive.   If diarrhea stomach not better in the next 1-2 weeks then contact [place].  Blood pressure is good.   Let [place] how you are doing in about a month  And wellness in fall          [place] Dasan Hardman M.D.

## 2014-05-26 ENCOUNTER — Encounter: Payer: Self-pay | Admitting: Podiatry

## 2014-05-26 ENCOUNTER — Ambulatory Visit (INDEPENDENT_AMBULATORY_CARE_PROVIDER_SITE_OTHER): Payer: Medicare Other | Admitting: Podiatry

## 2014-05-26 VITALS — BP 135/71 | HR 71 | Resp 12

## 2014-05-26 DIAGNOSIS — L609 Nail disorder, unspecified: Secondary | ICD-10-CM | POA: Diagnosis not present

## 2014-05-26 DIAGNOSIS — L608 Other nail disorders: Secondary | ICD-10-CM

## 2014-05-26 NOTE — Progress Notes (Signed)
   Subjective:    Patient ID: Nicole Wood, female    DOB: [birth year], 69 y.o.   MRN: [id number]  HPI  N-THICK L-B/L TOENAILS D-1 MONTH O-SLOWLY C-LONGER A- N/A T-TRIM  Patient denies any pain in the toenails and is physically unable to reach toenails because of musculoskeletal pain  Review of Systems  Musculoskeletal: Positive for back pain and joint swelling.  All other systems reviewed and are negative.      Objective:   Physical Exam  Orientated 3  Vascular: DP pulses 2/4 bilaterally PT pulses 2/4 bilaterally  Neurological: Ankle reflex equal and reactive bilaterally Vibratory sensation intact bilaterally Sensation to 10 g monofilament wire intact 5/5 bilaterally  Dermatological: Toenails 1-5 bilaterally are mildly incurvated without any texture or color changes  Musculoskeletal: Adductovarus rotation of fourth toes bilaterally No restriction ankle, subtalar, midtarsal joints bilaterally      Assessment & Plan:   Assessment: Satisfactory neurovascular status Non-dystrophic toenails 6-10  Plan: Debrided non-dystrophic toenails 6-10 without any bleeding  Advised patient to have follow-up visit with pedicures for toenail debridement I recommended that she purchase her own nail clippers, do not place feet in a whirlpool and do not manipulate the cuticles  Reappoint at patient's request

## 2014-05-26 NOTE — Patient Instructions (Signed)
Okay to have toenails trimmed the pedicurist Okay to purchase your own nail clippers Do not soak in the whirlpool Do not manipulate cuticles

## 2014-07-15 ENCOUNTER — Ambulatory Visit (INDEPENDENT_AMBULATORY_CARE_PROVIDER_SITE_OTHER): Payer: Medicare Other | Admitting: Internal Medicine

## 2014-07-15 ENCOUNTER — Encounter: Payer: Self-pay | Admitting: Internal Medicine

## 2014-07-15 VITALS — BP 138/80 | HR 88 | Temp 97.8°F | Ht 64.0 in | Wt 137.7 lb

## 2014-07-15 DIAGNOSIS — R194 Change in bowel habit: Secondary | ICD-10-CM

## 2014-07-15 DIAGNOSIS — R195 Other fecal abnormalities: Secondary | ICD-10-CM

## 2014-07-15 DIAGNOSIS — K5909 Other constipation: Secondary | ICD-10-CM

## 2014-07-15 DIAGNOSIS — Z1211 Encounter for screening for malignant neoplasm of colon: Secondary | ICD-10-CM | POA: Diagnosis not present

## 2014-07-15 NOTE — Progress Notes (Signed)
Pre visit review using our clinic review tool, if applicable. No additional management support is needed unless otherwise documented below in the visit note.   Chief Complaint  Patient presents with  . Abdominal Pain    mucus in bowel x 2 weeks    HPI: Patient Nicole Wood  comes in today for SDA for  new problem evaluation.  Feels like a stomach virus  Nl stool with mucous around it.  2 weeks ago Nicole Wood  For b day husband   Was  Up most of night  Constipation like with mucous.  When eats   Had  Same feeling   After eating discomfort cramps not discomfort.   Nervous  About this .    Acid reflux better .  On ranitidine. pepto yesterday .   Darker stool No vomiting obstructive  sx  No severe pain not that related to eating no bleed chills  Stools can be  Pebbles at times  For years   No meds for constipation ROS: See pertinent positives and negatives per HPI. No vomiting no weight loss   ? Related to eating.   Tends to be anxious.  No other changes   Upper hb better  takes ocass napr for back paoin   Past Medical History  Diagnosis Date  . Depression     in the past  . Cystocele   . Hematuria     idiopathic  . Urinary incontinence   . Basal cell cancer     nose  . Osteopenia     dexa 2009, -2.3 osteoporosis -2.5 hip 2011  . Osteoarthritis     Family History  Problem Relation Age of Onset  . Pulmonary embolism Mother   . Depression Mother     major depressive disorder  . Cancer Father     bladder  . Diabetes Brother   . Hyperlipidemia Brother   . Arthritis Brother   . Arthritis Brother     DJD  . Hip fracture Mother     about 44   . Heart attack Neg Hx   . Stroke Maternal Aunt   . Hypertension Mother     History   Social History  . Marital Status: Married    Spouse Name: N/A  . Number of Children: N/A  . Years of Education: N/A   Social History Main Topics  . Smoking status: Never Smoker   . Smokeless tobacco: Not on file  . Alcohol Use: Yes  . Drug  Use: No  . Sexual Activity:    Partners: Female   Other Topics Concern  . 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 NHL in remission   Regular exercise- yes   HH of 2   No pets    Multiple myeloma and non hogkins lymphoma in husband x 6 years   Regular exercise: walk, swim, some biking   Caffeine use: coffee daily, sweet tea    Outpatient Prescriptions Prior to Visit  Medication Sig Dispense Refill  . calcium-vitamin D (OSCAL WITH D) 500-200 MG-UNIT per tablet Take 1 tablet by mouth daily.      . cyclobenzaprine (FLEXERIL) 10 MG tablet Take 10 mg by mouth at bedtime as needed. Taking half tab    . lisinopril (PRINIVIL,ZESTRIL) 10 MG tablet TAKE 1 TABLET BY MOUTH DAILY, MAY INCREASE TO 2 TABLETS DAILY AS DIRECTED 240  tablet 0  . metoprolol succinate (TOPROL XL) 25 MG 24 hr tablet Take 1 tablet (25 mg total) by mouth daily. (Patient taking differently: Take 25 mg by mouth as needed. ) 30 tablet 6  . Multiple Vitamins-Minerals (ICAPS PO) Take 2 capsules by mouth daily.    . naproxen sodium (ANAPROX) 220 MG tablet Take 220 mg by mouth 2 (two) times daily.    . meloxicam (MOBIC) 15 MG tablet      No facility-administered medications prior to visit.     EXAM:  BP 138/80 mmHg  Pulse 105  Temp(Src) 97.8 F (36.6 C) (Oral)  Ht '5\' 4"'  (1.626 m)  Wt 137 lb 11.2 oz (62.46 kg)  BMI 23.62 kg/m2  Body mass index is 23.62 kg/(m^2).  GENERAL: vitals reviewed and listed above, alert, oriented, appears well hydrated and in no acute distress HEENT: atraumatic, conjunctiva  clear, no obvious abnormalities on inspection of external nose and ears NECK: no obvious masses on inspection palpation  LUNGS: clear to auscultation bilaterally, no wheezes, rales or rhonchi, good air movement CV: HRRR, no clubbing cyanosis or  peripheral edema nl cap refill  Abdomen:  Sof,t normal bowel sounds  without hepatosplenomegaly, no guarding rebound or masses no CVA tenderness MS: moves all extremities without noticeable focal  abnormality PSYCH: pleasant and cooperative, no obvious depression  Mild anxiety   ASSESSMENT AND PLAN:  Discussed the following assessment and plan:  Change in bowel habits - anxious and worried about this  reassured poss ibs but refer to gi due fo consoiscopy  - Plan: Ambulatory referral to Gastroenterology  Mucous in stools - add probiotic and miralax  gi consults  colon - Plan: Ambulatory referral to Gastroenterology  Other constipation - hx of same chronic  - Plan: Ambulatory referral to Gastroenterology  Colon cancer screening - Plan: Ambulatory referral to Gastroenterology  -Patient advised to return or notify health care team  if symptoms worsen ,persist or new concerns arise.  Patient Instructions  This could be irritable bowel of constipation side effects.   Begin    Probiotic  . Also  Try miralax   1 capful per day .   To soften stool very  Gently  Will arrange  Referral to gi and  Due fo colonscopy any way.      Standley Brooking. Panosh M.D.

## 2014-07-15 NOTE — Patient Instructions (Signed)
This could be irritable bowel of constipation side effects.   Begin    Probiotic  . Also  Try miralax   1 capful per day .   To soften stool very  Gently  Will arrange  Referral to gi and  Due fo colonscopy any way.

## 2014-07-23 DIAGNOSIS — H6122 Impacted cerumen, left ear: Secondary | ICD-10-CM | POA: Diagnosis not present

## 2014-07-31 ENCOUNTER — Ambulatory Visit (INDEPENDENT_AMBULATORY_CARE_PROVIDER_SITE_OTHER): Payer: Medicare Other | Admitting: Nurse Practitioner

## 2014-07-31 ENCOUNTER — Encounter: Payer: Self-pay | Admitting: Nurse Practitioner

## 2014-07-31 VITALS — BP 134/72 | HR 70

## 2014-07-31 DIAGNOSIS — Z1211 Encounter for screening for malignant neoplasm of colon: Secondary | ICD-10-CM | POA: Diagnosis not present

## 2014-07-31 MED ORDER — PEG-KCL-NACL-NASULF-NA ASC-C 100 G PO SOLR: 1.0000 | Freq: Once | ORAL | Status: DC

## 2014-07-31 NOTE — Progress Notes (Addendum)
HPI :  Patient is a 69 year old female referred by PCP for bowel changes and screening colonoscopy. A few weeks ago patient developed constipation and intermittent lower abdominal pain. Stools contained mucous but no blood. PCP recommended Miralx and Align  After a week of Miralax she began passing thick, pasty BMs 2-3 times a day. She stopped the Miralax and started eating fresh fruit and BMs getting back to baseline. Still taking Align.  No recent med changes. Weight stable.   Past Medical History  Diagnosis Date  . Depression     in the past  . Cystocele   . Hematuria     idiopathic  . Urinary incontinence   . Basal cell cancer     nose  . Osteopenia     dexa 2009, -2.3 osteoporosis -2.5 hip 2011  . Osteoarthritis     Family History  Problem Relation Age of Onset  . Pulmonary embolism Mother   . Depression Mother     major depressive disorder  . Cancer Father     bladder  . Diabetes Brother   . Hyperlipidemia Brother   . Arthritis Brother   . Arthritis Brother     DJD  . Hip fracture Mother     about 42   . Heart attack Neg Hx   . Stroke Maternal Aunt   . Hypertension Mother    History  Substance Use Topics  . Smoking status: Never Smoker   . Smokeless tobacco: Not on file  . Alcohol Use: Yes   Current Outpatient Prescriptions  Medication Sig Dispense Refill  . calcium-vitamin D (OSCAL WITH D) 500-200 MG-UNIT per tablet Take 1 tablet by mouth daily.      . cyclobenzaprine (FLEXERIL) 10 MG tablet Take 10 mg by mouth at bedtime as needed. Taking half tab    . lisinopril (PRINIVIL,ZESTRIL) 10 MG tablet TAKE 1 TABLET BY MOUTH DAILY, MAY INCREASE TO 2 TABLETS DAILY AS DIRECTED 240 tablet 0  . metoprolol succinate (TOPROL XL) 25 MG 24 hr tablet Take 1 tablet (25 mg total) by mouth daily. (Patient taking differently: Take 25 mg by mouth as needed. ) 30 tablet 6  . Multiple Vitamins-Minerals (ICAPS PO) Take 2 capsules by mouth daily.    . naproxen sodium (ANAPROX)  220 MG tablet Take 220 mg by mouth 2 (two) times daily.     No current facility-administered medications for this visit.   Allergies  Allergen Reactions  . Contrast Media [Iodinated Diagnostic Agents]     Gums got numb after a procedure  . Penicillins     Review of Systems: All systems reviewed and negative except where noted in HPI.    Physical Exam: BP 134/72 mmHg  Pulse 70 Constitutional: Pleasant,well-developed, white female in no acute distress. HEENT: Normocephalic and atraumatic. Conjunctivae are normal. No scleral icterus. Neck supple.  Cardiovascular: Normal rate, regular rhythm.  Pulmonary/chest: Effort normal and breath sounds normal. No wheezing, rales or rhonchi. Abdominal: Soft, nondistended, nontender. Bowel sounds active throughout. There are no masses palpable. No hepatomegaly. Extremities: no edema Lymphadenopathy: No cervical adenopathy noted. Neurological: Alert and oriented to person place and time. Skin: Skin is warm and dry. No rashes noted. Psychiatric: Normal mood and affect. Behavior is normal.   ASSESSMENT AND PLAN:  30. 68 year old female for colon cancer screening.  The risks, benefits, and alternatives to colonoscopy with possible biopsy and possible polypectomy were discussed with the patient and she consents to proceed.  2. Transient constipation , resolved. No longer requiring miralax.   Cc: Shanon Ace, MD  Records received from [facility].  Screening colonoscopy by Dr. Watt Climes March 2007. Findings: internal and external hemorrhoids only. Moderately redundant colon in sigmoid, splenic and hepatic flexure.

## 2014-07-31 NOTE — Patient Instructions (Signed)
You have been scheduled for a colonoscopy. Please follow written instructions given to you at your visit today.  Please pick up your prep supplies at the pharmacy within the next 1-3 days. If you use inhalers (even only as needed), please bring them with you on the day of your procedure. Your physician has requested that you go to www.startemmi.com and enter the access code given to you at your visit today. This web site gives a general overview about your procedure. However, you should still follow specific instructions given to you by our office regarding your preparation for the procedure.  We will obtain your records from [facility] for Tye Savoy NP-C to review.

## 2014-08-02 ENCOUNTER — Encounter: Payer: Self-pay | Admitting: Nurse Practitioner

## 2014-08-03 DIAGNOSIS — Z1211 Encounter for screening for malignant neoplasm of colon: Secondary | ICD-10-CM | POA: Insufficient documentation

## 2014-08-04 NOTE — Progress Notes (Signed)
Agree with initial assessment and plans as outlined 

## 2014-09-10 ENCOUNTER — Telehealth: Payer: Self-pay | Admitting: Family Medicine

## 2014-09-10 ENCOUNTER — Other Ambulatory Visit: Payer: Self-pay | Admitting: *Deleted

## 2014-09-10 ENCOUNTER — Other Ambulatory Visit: Payer: Self-pay | Admitting: Family Medicine

## 2014-09-10 DIAGNOSIS — Z0000 Encounter for general adult medical examination without abnormal findings: Secondary | ICD-10-CM

## 2014-09-10 MED ORDER — LISINOPRIL 10 MG PO TABS: ORAL_TABLET | ORAL | Status: DC

## 2014-09-10 NOTE — Telephone Encounter (Signed)
Pt is due for medicare wellness in the fall.  Please help the pt to make a fasting appointment.  Thanks!

## 2014-09-11 NOTE — Telephone Encounter (Signed)
Pt has been sch

## 2014-09-16 ENCOUNTER — Telehealth: Payer: Self-pay | Admitting: *Deleted

## 2014-09-16 NOTE — Telephone Encounter (Signed)
LM for patient that I will call her tomorrow with a prep sample and new instructions.  We have samples coming in and I will know which one I can give her tomorrow the 20th.

## 2014-09-19 NOTE — Telephone Encounter (Signed)
I called the patient and she went ahead and got the Moviprep even though it was 97.00.

## 2014-09-21 ENCOUNTER — Encounter (HOSPITAL_COMMUNITY): Payer: Self-pay | Admitting: Nurse Practitioner

## 2014-09-21 ENCOUNTER — Emergency Department (HOSPITAL_COMMUNITY)
Admission: EM | Admit: 2014-09-21 | Discharge: 2014-09-21 | Disposition: A | Discharge status: Home / Self Care | Payer: Medicare Other | Attending: Emergency Medicine | Admitting: Emergency Medicine

## 2014-09-21 DIAGNOSIS — F419 Anxiety disorder, unspecified: Secondary | ICD-10-CM | POA: Diagnosis present

## 2014-09-21 DIAGNOSIS — Z791 Long term (current) use of non-steroidal anti-inflammatories (NSAID): Secondary | ICD-10-CM | POA: Diagnosis not present

## 2014-09-21 DIAGNOSIS — Z880 Allergy status to penicillin: Secondary | ICD-10-CM | POA: Diagnosis not present

## 2014-09-21 DIAGNOSIS — Z79899 Other long term (current) drug therapy: Secondary | ICD-10-CM | POA: Insufficient documentation

## 2014-09-21 DIAGNOSIS — Z8739 Personal history of other diseases of the musculoskeletal system and connective tissue: Secondary | ICD-10-CM | POA: Insufficient documentation

## 2014-09-21 DIAGNOSIS — Z8742 Personal history of other diseases of the female genital tract: Secondary | ICD-10-CM | POA: Insufficient documentation

## 2014-09-21 DIAGNOSIS — Z85828 Personal history of other malignant neoplasm of skin: Secondary | ICD-10-CM | POA: Diagnosis not present

## 2014-09-21 LAB — I-STAT CHEM 8, ED
BUN: 11 mg/dL (ref 6–20)
CALCIUM ION: 1.21 mmol/L (ref 1.13–1.30)
Chloride: 99 mmol/L — ABNORMAL LOW (ref 101–111)
Creatinine, Ser: 0.7 mg/dL (ref 0.44–1.00)
Glucose, Bld: 111 mg/dL — ABNORMAL HIGH (ref 65–99)
HCT: 33 % — ABNORMAL LOW (ref 36.0–46.0)
Hemoglobin: 11.2 g/dL — ABNORMAL LOW (ref 12.0–15.0)
Potassium: 4.1 mmol/L (ref 3.5–5.1)
Sodium: 134 mmol/L — ABNORMAL LOW (ref 135–145)
TCO2: 22 mmol/L (ref 0–100)

## 2014-09-21 LAB — CBG MONITORING, ED: GLUCOSE-CAPILLARY: 131 mg/dL — AB (ref 65–99)

## 2014-09-21 NOTE — ED Provider Notes (Signed)
CSN: [id number]     Arrival date & time 09/21/14  1705 History   First MD Initiated Contact with Patient 09/21/14 1913     Chief Complaint  Patient presents with  . Anxiety     Patient is a 69 y.o. female presenting with anxiety. The history is provided by the patient and the spouse. No language interpreter was used.  Anxiety   [name] for evaluation of anxiety. She has a history of anxiety and she has been prepping for a colonoscopy tomorrow. She's been drinking clear liquids today. Throughout the day she is developed increased anxiety and shakiness sensation. She feels weakness in her legs. She had some nausea earlier which is now resolved. She was at home alone and felt poorly in general and overall weakness and became scared and called 911. EMS came to evaluate the patient and she decided to present to the Hospital by private vehicle for further evaluation. She did take 0.125 mg of Xanax without any improvement in her symptoms. She denies any chest pain, shortness of breath, abdominal pain, vomiting, numbness.  Past Medical History  Diagnosis Date  . Depression     in the past  . Cystocele   . Hematuria     idiopathic  . Urinary incontinence   . Basal cell cancer     nose  . Osteopenia     dexa 2009, -2.3 osteoporosis -2.5 hip 2011  . Osteoarthritis    Past Surgical History  Procedure Laterality Date  . Bladder tack    . Urethral dilation    . Rt shoulder surgery   2004    [place]  . Abdominal hysterectomy      fibroids and cyst right ovary left oopherectomy  . Basal cell ca removed     Family History  Problem Relation Age of Onset  . Pulmonary embolism Mother   . Depression Mother     major depressive disorder  . Cancer Father     bladder  . Diabetes Brother   . Hyperlipidemia Brother   . Arthritis Brother   . Arthritis Brother     DJD  . Hip fracture Mother     about 62   . Heart attack Neg Hx   . Stroke Maternal Aunt   . Hypertension Mother     History  Substance Use Topics  . Smoking status: Never Smoker   . Smokeless tobacco: Not on file  . Alcohol Use: Yes   OB History    No data available     Review of Systems  All other systems reviewed and are negative.     Allergies  Contrast media and Penicillins  Home Medications   Prior to Admission medications   Medication Sig Start Date End Date Taking? Authorizing Provider  calcium-vitamin D (OSCAL WITH D) 500-200 MG-UNIT per tablet Take 1 tablet by mouth daily.      Historical Provider, MD  cyclobenzaprine (FLEXERIL) 10 MG tablet Take 10 mg by mouth at bedtime as needed. Taking half tab    Historical Provider, MD  lisinopril (PRINIVIL,ZESTRIL) 10 MG tablet TAKE 1 TABLET BY MOUTH DAILY, MAY INCREASE TO 2 TABLETS DAILY AS DIRECTED 09/10/14   Burnis Medin, MD  metoprolol succinate (TOPROL XL) 25 MG 24 hr tablet Take 1 tablet (25 mg total) by mouth daily. Patient taking differently: Take 25 mg by mouth as needed.  02/11/14   Jerline Pain, MD  Multiple Vitamins-Minerals (ICAPS PO) Take 2 capsules by  mouth daily.    Historical Provider, MD  naproxen sodium (ANAPROX) 220 MG tablet Take 220 mg by mouth 2 (two) times daily.    Historical Provider, MD  peg 3350 powder (MOVIPREP) 100 G SOLR Take 1 kit (200 g total) by mouth once. 07/31/14   Willia Craze, NP   BP 121/44 mmHg  Pulse 70  Temp(Src) 98.1 F (36.7 C) (Oral)  Resp 16  Ht '5\' 4"'  (1.626 m)  Wt 137 lb 6.4 oz (62.324 kg)  BMI 23.57 kg/m2  SpO2 100% Physical Exam  Constitutional: She is oriented to person, place, and time. She appears well-developed and well-nourished.  HENT:  Head: Normocephalic and atraumatic.  Cardiovascular: Normal rate and regular rhythm.   No murmur heard. Pulmonary/Chest: Effort normal and breath sounds normal. No respiratory distress.  Abdominal: Soft. There is no tenderness. There is no rebound and no guarding.  Musculoskeletal: She exhibits no edema or tenderness.  Neurological: She  is alert and oriented to person, place, and time.   Moves all x-ray symmetrically  Skin: Skin is warm and dry.  Psychiatric:   anxious  Nursing note and vitals reviewed.   ED Course  Procedures (including critical care time) Labs Review Labs Reviewed  CBG MONITORING, ED - Abnormal; Notable for the following:    Glucose-Capillary 131 (*)    All other components within normal limits  I-STAT CHEM 8, ED - Abnormal; Notable for the following:    Sodium 134 (*)    Chloride 99 (*)    Glucose, Bld 111 (*)    Hemoglobin 11.2 (*)    HCT 33.0 (*)    All other components within normal limits    Imaging Review No results found.   EKG Interpretation   Date/Time:  Sunday September 21 2014 17:21:52 EDT Ventricular Rate:  82 PR Interval:  172 QRS Duration: 102 QT Interval:  380 QTC Calculation: 443 R Axis:   32 Text Interpretation:  Normal sinus rhythm possible  Septal infarct , age  undetermined Abnormal ECG No significant change since last tracing  Confirmed by Hazle Coca [phone]) on 09/21/2014 8:17:56 PM      MDM   Final diagnoses:  Anxiety    Pt here for anxiety, leg weakness since prepping for a colonoscopy.  Pt feeling improved in department compared to when she was at home.  No evidence of acute electrolyte abnormality.  Hx and presentation not c/w ACS, CVA.  Discussed home care, return precautions.     Quintella Reichert, MD 09/22/14 [phone]

## 2014-09-21 NOTE — Discharge Instructions (Signed)

## 2014-09-21 NOTE — ED Notes (Signed)
Pt states she has been home doing colonoscopy prep at home today and she began to have anxiety , restlessness and generalized not feeling well. She c/o lower back pain/ hip pain that is chronic from arthritis. she is A&olx4, resp e/u. Ems came to scene but she chose to drive herself her.

## 2014-09-22 ENCOUNTER — Telehealth: Payer: Self-pay | Admitting: Internal Medicine

## 2014-09-22 ENCOUNTER — Encounter: Payer: Medicare Other | Admitting: Internal Medicine

## 2014-09-22 NOTE — Telephone Encounter (Signed)
Tomorrow at 11:15 and 11:30 or    Friday august 5th  At 3 pm  30 minutes   Or she can see another provider if cant wait .

## 2014-09-22 NOTE — Telephone Encounter (Signed)
Pt was in the ER over the weekend and need a office visit where can I schedule her or can she see someone else.

## 2014-09-22 NOTE — Telephone Encounter (Signed)
No

## 2014-09-23 ENCOUNTER — Telehealth: Payer: Self-pay | Admitting: Internal Medicine

## 2014-09-23 ENCOUNTER — Ambulatory Visit (INDEPENDENT_AMBULATORY_CARE_PROVIDER_SITE_OTHER): Payer: Medicare Other | Admitting: Internal Medicine

## 2014-09-23 ENCOUNTER — Encounter: Payer: Self-pay | Admitting: Internal Medicine

## 2014-09-23 VITALS — BP 124/80 | Temp 98.0°F | Ht 64.0 in | Wt 134.5 lb

## 2014-09-23 DIAGNOSIS — M549 Dorsalgia, unspecified: Secondary | ICD-10-CM | POA: Diagnosis not present

## 2014-09-23 DIAGNOSIS — F411 Generalized anxiety disorder: Secondary | ICD-10-CM

## 2014-09-23 MED ORDER — BUSPIRONE HCL 5 MG PO TABS: 7.5000 mg | ORAL_TABLET | Freq: Two times a day (BID) | ORAL | Status: DC

## 2014-09-23 MED ORDER — LORAZEPAM 0.5 MG PO TABS: 0.5000 mg | ORAL_TABLET | Freq: Three times a day (TID) | ORAL | Status: DC | PRN

## 2014-09-23 NOTE — Progress Notes (Signed)
Pre visit review using our clinic review tool, if applicable. No additional management support is needed unless otherwise documented below in the visit note.  Chief Complaint  Patient presents with  . ER Follow Up    HPI: Patient come in for follow up from ED visit  sx felt to be from anxiety  Doing prep for colonoscopy has number of complaints  Never got to the   Clean out.    Moviprep.     Ed 3-10   . Had labs done was told they were reassuring. Thinks things got out of hand      Ed ems bp 182/74 puls ox 99% cbg 134 rr18 p 100   Felt to be anxiety .   [facility] was out of town.   Something for situational   Anxiety.   Also is noticing some funny feelings in her legs somewhat tingling not severe pain and her back was bothering her this morning. Has seen [facility] in the past for her back did have leg symptoms. Is having family company coming soon no injury no specific weakness.   Family  Very pos for anxiety  Grandson in cbt for social anxiety  Mom had depression anxiety  Had ect   ROS: See pertinent positives and negatives per HPI. No fever cp sob denise depression at this time has taken as needed remeron for sleep causes grogginess  Past Medical History  Diagnosis Date  . Depression     in the past  . Cystocele   . Hematuria     idiopathic  . Urinary incontinence   . Basal cell cancer     nose  . Osteopenia     dexa 2009, -2.3 osteoporosis -2.5 hip 2011  . Osteoarthritis     Family History  Problem Relation Age of Onset  . Pulmonary embolism Mother   . Depression Mother     major depressive disorder  . Cancer Father     bladder  . Diabetes Brother   . Hyperlipidemia Brother   . Arthritis Brother   . Arthritis Brother     DJD  . Hip fracture Mother     about 69   . Heart attack Neg Hx   . Stroke Maternal Aunt   . Hypertension Mother     History   Social History  . Marital Status: Married    Spouse Name: N/A  . Number of Children: N/A  . Years of  Education: N/A   Social History Main Topics  . Smoking status: Never Smoker   . Smokeless tobacco: Not on file  . Alcohol Use: Yes  . Drug Use: No  . Sexual Activity:    Partners: Female   Other Topics Concern  . None   Social History Narrative   Retired Arts development officer) some college   Married 4 children [place],, Midwife, and [place], and [place] Husband cancer MM. NHL in remission   Regular exercise- yes   HH of 2   No pets    Multiple myeloma and non hogkins lymphoma in husband x 6 years   Regular exercise: walk, swim, some biking   Caffeine use: coffee daily, sweet tea    Outpatient Prescriptions Prior to Visit  Medication Sig Dispense Refill  . bifidobacterium infantis (ALIGN) capsule Take 1 capsule by mouth daily.    . calcium-vitamin D (OSCAL WITH D) 500-200 MG-UNIT per tablet Take 1 tablet by mouth daily.      . cyclobenzaprine (FLEXERIL) 10 MG  tablet Take 5 mg by mouth at bedtime as needed (TMJ).     Marland Kitchen ibuprofen (ADVIL,MOTRIN) 200 MG tablet Take 200-400 mg by mouth 2 (two) times daily as needed (hip and back pain).    Marland Kitchen lisinopril (PRINIVIL,ZESTRIL) 10 MG tablet TAKE 1 TABLET BY MOUTH DAILY, MAY INCREASE TO 2 TABLETS DAILY AS DIRECTED (Patient taking differently: Take 10 mg by mouth daily after supper. MAY INCREASE TO 2 TABLETS DAILY AS DIRECTED) 180 tablet 0  . metoprolol succinate (TOPROL XL) 25 MG 24 hr tablet Take 1 tablet (25 mg total) by mouth daily. (Patient taking differently: Take 25 mg by mouth daily as needed (palpitations). ) 30 tablet 6  . Multiple Vitamins-Minerals (ICAPS PO) Take 1 capsule by mouth 2 (two) times daily.     . naproxen sodium (ANAPROX) 220 MG tablet Take 220 mg by mouth 2 (two) times daily as needed (pain). ALEVE    . peg 3350 powder (MOVIPREP) 100 G SOLR Take 1 kit (200 g total) by mouth once. 1 kit 0  . polyethylene glycol (MIRALAX / GLYCOLAX) packet Take 8.5 g by mouth daily.     No facility-administered medications prior to visit.      EXAM:  BP 124/80 mmHg  Temp(Src) 98 F (36.7 C) (Oral)  Ht '5\' 4"'  (1.626 m)  Wt 134 lb 8 oz (61.009 kg)  BMI 23.08 kg/m2  Body mass index is 23.08 kg/(m^2).  GENERAL: vitals reviewed and listed above, alert, oriented, appears well hydrated and in no acute distress   Favoring back but no waeknss of gait   Neg slr foot drop HEENT: atraumatic, conjunctiva  clear, no obvious abnormalities on inspection of external nose and ears NECK: no obvious masses on inspection palpation  LUNGS: clear to auscultation bilaterally, no wheezes, rales or rhonchi, good air movement CV: HRRR, no clubbing cyanosis or  peripheral edema nl cap refill  MS: moves all extremities without noticeable focal  abnormality PSYCH: pleasant and cooperative,   Good eye contact  cognition Lab Results  Component Value Date   WBC 7.6 01/09/2014   HGB 11.2* 09/21/2014   HCT 33.0* 09/21/2014   PLT 316.0 01/09/2014   GLUCOSE 111* 09/21/2014   CHOL 220* 01/09/2014   TRIG 156.0* 01/09/2014   HDL 50.30 01/09/2014   LDLDIRECT 137.2 12/14/2010   LDLCALC 139* 01/09/2014   ALT 11 01/09/2014   AST 14 01/09/2014   NA 134* 09/21/2014   K 4.1 09/21/2014   CL 99* 09/21/2014   CREATININE 0.70 09/21/2014   BUN 11 09/21/2014   CO2 26 01/20/2014   TSH 1.45 01/09/2014    ASSESSMENT AND PLAN:  Discussed the following assessment and plan:  Anxiety state - flare with husband out of town aware conitivelky fam hx  add   buspar after company leaves and fu 3-4 weeks counseling advised HO given   Bilateral back pain, unspecified location - now some leg sx  proba radiating  reeval by her ortho  no alarm sx today Total visit 30 mins > 50% spent counseling and coordinating care as indicated in above note and in instructions to patient .     -Patient advised to return or notify health care team  if symptoms worsen ,persist or new concerns arise.  Patient Instructions  Blood pressure is very good today .  Anxiety  Effecting   health .Marland Kitchen Issues  Advise   .  Counseling strategies  Such as CBT.   Advise try buspirone    Possible  or other  Controller med .    Can use situational  Med ativan if needed in the interim   Legs  Seem to be from the back issue.  Please see your ortho group for   Opinion about the leg sx ? From the back predicament.       Standley Brooking. Obelia Bonello M.D.

## 2014-09-23 NOTE — Patient Instructions (Addendum)
Blood pressure is very good today .  Anxiety  Effecting  health .Marland Kitchen Issues  Advise   .  Counseling strategies  Such as CBT.   Advise try buspirone    Possible or other  Controller med .    Can use situational  Med ativan if needed in the interim   Legs  Seem to be from the back issue.  Please see your ortho group for   Opinion about the leg sx ? From the back predicament.

## 2014-09-24 DIAGNOSIS — M25551 Pain in right hip: Secondary | ICD-10-CM | POA: Diagnosis not present

## 2014-09-24 DIAGNOSIS — M545 Low back pain: Secondary | ICD-10-CM | POA: Diagnosis not present

## 2014-09-24 DIAGNOSIS — M25552 Pain in left hip: Secondary | ICD-10-CM | POA: Diagnosis not present

## 2014-10-03 NOTE — Telephone Encounter (Signed)
No answer. By switching patient to [facility] and re instructing I can given patient a free sample

## 2014-10-06 NOTE — Telephone Encounter (Signed)
Patient states she will call next week to let me know when she is coming to be re instructed and given Suprep sample

## 2014-10-07 ENCOUNTER — Telehealth: Payer: Self-pay

## 2014-10-13 DIAGNOSIS — M545 Low back pain: Secondary | ICD-10-CM | POA: Diagnosis not present

## 2014-10-24 ENCOUNTER — Telehealth: Payer: Self-pay

## 2014-10-24 NOTE — Telephone Encounter (Signed)
Lm for patient to call me to schedule a time to come by to pick up her new colonoscopy instructions and Suprep sample.

## 2014-10-28 ENCOUNTER — Other Ambulatory Visit: Payer: Self-pay | Admitting: Internal Medicine

## 2014-10-28 NOTE — Telephone Encounter (Signed)
Patient indicated she would come by to get her prep and instructions yesterday afternoon but did not show up.  Will call her again to remind her

## 2014-10-29 DIAGNOSIS — M7061 Trochanteric bursitis, right hip: Secondary | ICD-10-CM | POA: Diagnosis not present

## 2014-10-29 DIAGNOSIS — M7062 Trochanteric bursitis, left hip: Secondary | ICD-10-CM | POA: Diagnosis not present

## 2014-10-29 DIAGNOSIS — M47817 Spondylosis without myelopathy or radiculopathy, lumbosacral region: Secondary | ICD-10-CM | POA: Diagnosis not present

## 2014-10-29 DIAGNOSIS — M545 Low back pain: Secondary | ICD-10-CM | POA: Diagnosis not present

## 2014-10-29 NOTE — Telephone Encounter (Signed)
Called to the pharmacy and left on machine. 

## 2014-10-29 NOTE — Telephone Encounter (Signed)
Ok to refill x 1  

## 2014-10-29 NOTE — Telephone Encounter (Signed)
Patient came by and was re instructed with Suprep instructions and given prep sample.

## 2014-12-01 ENCOUNTER — Telehealth: Payer: Self-pay | Admitting: Internal Medicine

## 2014-12-01 NOTE — Telephone Encounter (Signed)
Pt called in to see if it is alright to take her new medication, Buspar, prior to the procedure tomorrow. Advised pt it is okay to take Buspar on procedure day.

## 2014-12-02 ENCOUNTER — Ambulatory Visit (AMBULATORY_SURGERY_CENTER): Payer: Medicare Other | Admitting: Internal Medicine

## 2014-12-02 ENCOUNTER — Encounter: Payer: Self-pay | Admitting: Internal Medicine

## 2014-12-02 VITALS — BP 128/60 | HR 74 | Temp 97.6°F | Resp 35 | Ht 64.0 in | Wt 134.0 lb

## 2014-12-02 DIAGNOSIS — D122 Benign neoplasm of ascending colon: Secondary | ICD-10-CM | POA: Diagnosis not present

## 2014-12-02 DIAGNOSIS — Z1211 Encounter for screening for malignant neoplasm of colon: Secondary | ICD-10-CM | POA: Diagnosis present

## 2014-12-02 DIAGNOSIS — I10 Essential (primary) hypertension: Secondary | ICD-10-CM | POA: Diagnosis not present

## 2014-12-02 MED ORDER — SODIUM CHLORIDE 0.9 % IV SOLN: 500.0000 mL | INTRAVENOUS | Status: DC

## 2014-12-02 NOTE — Patient Instructions (Signed)
YOU HAD AN ENDOSCOPIC PROCEDURE TODAY AT [facility]:   Refer to the procedure report that was given to you for any specific questions about what was found during the examination.  If the procedure report does not answer your questions, please call your gastroenterologist to clarify.  If you requested that your care partner not be given the details of your procedure findings, then the procedure report has been included in a sealed envelope for you to review at your convenience later.  YOU SHOULD EXPECT: Some feelings of bloating in the abdomen. Passage of more gas than usual.  Walking can help get rid of the air that was put into your GI tract during the procedure and reduce the bloating. If you had a lower endoscopy (such as a colonoscopy or flexible sigmoidoscopy) you may notice spotting of blood in your stool or on the toilet paper. If you underwent a bowel prep for your procedure, you may not have a normal bowel movement for a few days.  Please Note:  You might notice some irritation and congestion in your nose or some drainage.  This is from the oxygen used during your procedure.  There is no need for concern and it should clear up in a day or so.  SYMPTOMS TO REPORT IMMEDIATELY:   Following lower endoscopy (colonoscopy or flexible sigmoidoscopy):  Excessive amounts of blood in the stool  Significant tenderness or worsening of abdominal pains  Swelling of the abdomen that is new, acute  Fever of 100F or higher    For urgent or emergent issues, a gastroenterologist can be reached at any hour by calling [phone].   DIET: Your first meal following the procedure should be a small meal and then it is ok to progress to your normal diet. Heavy or fried foods are harder to digest and may make you feel nauseous or bloated.  Likewise, meals heavy in dairy and vegetables can increase bloating.  Drink plenty of fluids but you should avoid alcoholic beverages for 24  hours.  ACTIVITY:  You should plan to take it easy for the rest of today and you should NOT DRIVE or use heavy machinery until tomorrow (because of the sedation medicines used during the test).    FOLLOW UP: Our staff will call the number listed on your records the next business day following your procedure to check on you and address any questions or concerns that you may have regarding the information given to you following your procedure. If we do not reach you, we will leave a message.  However, if you are feeling well and you are not experiencing any problems, there is no need to return our call.  We will assume that you have returned to your regular daily activities without incident.  If any biopsies were taken you will be contacted by phone or by letter within the next 1-3 weeks.  Please call us at [phone] if you have not heard about the biopsies in 3 weeks.    SIGNATURES/CONFIDENTIALITY: You and/or your care partner have signed paperwork which will be entered into your electronic medical record.  These signatures attest to the fact that that the information above on your After Visit Summary has been reviewed and is understood.  Full responsibility of the confidentiality of this discharge information lies with you and/or your care-partner.    Information on polyps,diverticulosis and high fiber diet given to you today

## 2014-12-02 NOTE — Op Note (Signed)
[facility]  [address] [place], [address]   COLONOSCOPY PROCEDURE REPORT  PATIENT: Nicole Wood, Nicole Wood  MR#: [id number] BIRTHDATE: [birth year] , 40  yrs. old GENDER: female ENDOSCOPIST: Eustace Quail, MD REFERRED [id number] Darnelle Going, M.D. PROCEDURE DATE:  12/02/2014 PROCEDURE:   Colonoscopy, screening and Colonoscopy with snare polypectomy X1 First Screening Colonoscopy - Avg.  risk and is 50 yrs.  old or older - No.  Prior Negative Screening - Now for repeat screening. 10 or more years since last screening  History of Adenoma - Now for follow-up colonoscopy & has been > or = to 3 yrs.  N/A  Polyps removed today? Yes ASA CLASS:   Class II INDICATIONS:Screening for colonic neoplasia and Colorectal Neoplasm Risk Assessment for this procedure is average risk. . Negative exam (elsewhere) March 2007 MEDICATIONS: Propofol 300 mg IV and Monitored anesthesia care  DESCRIPTION OF PROCEDURE:   After the risks benefits and alternatives of the procedure were thoroughly explained, informed consent was obtained.  The digital rectal exam revealed no abnormalities of the rectum.   The [id number]  endoscope was introduced through the anus and advanced to the cecum, which was identified by both the appendix and ileocecal valve. No adverse events experienced.   The quality of the prep was excellent. (Suprep was used)  The instrument was then slowly withdrawn as the colon was fully examined. Estimated blood loss is zero unless otherwise noted in this procedure report.   COLON FINDINGS: A sessile polyp measuring 6 mm in size was found in the ascending colon.  A polypectomy was performed with a cold snare.  The resection was complete, the polyp tissue was completely retrieved and sent to histology.   There was moderate diverticulosis noted in the sigmoid colon.   The examination was otherwise normal.  Retroflexed views revealed no abnormalities. The time to cecum = 4.5  Withdrawal time = 12.3   The scope was withdrawn and the procedure completed. COMPLICATIONS: There were no immediate complications.  ENDOSCOPIC IMPRESSION: 1.   Sessile polyp was found in the ascending colon; polypectomy was performed with a cold snare 2.   Moderate diverticulosis was noted in the sigmoid colon 3.   The examination was otherwise normal  RECOMMENDATIONS: 1. Repeat colonoscopy in 5 years if polyp adenomatous; otherwise 10 years  eSigned:  Eustace Quail, MD 12/02/2014 11:53 AM   cc: The Patient and Burnis Medin, MD

## 2014-12-02 NOTE — Progress Notes (Signed)
Transferred to recovery room. A/O x3, pleased with MAC.  VSS.  Report to Jane, RN. 

## 2014-12-02 NOTE — Progress Notes (Signed)
Called to room to assist during endoscopic procedure.  Patient ID and intended procedure confirmed with present staff. Received instructions for my participation in the procedure from the performing physician.  

## 2014-12-03 ENCOUNTER — Other Ambulatory Visit: Payer: Self-pay | Admitting: Internal Medicine

## 2014-12-03 ENCOUNTER — Telehealth: Payer: Self-pay | Admitting: *Deleted

## 2014-12-03 NOTE — Telephone Encounter (Signed)
Left message that we called for f/u 

## 2014-12-03 NOTE — Telephone Encounter (Signed)
Called to the pharmacy and left on machine. 

## 2014-12-03 NOTE — Telephone Encounter (Signed)
Ok x 1

## 2014-12-06 ENCOUNTER — Other Ambulatory Visit: Payer: Self-pay | Admitting: Internal Medicine

## 2014-12-08 ENCOUNTER — Encounter: Payer: Self-pay | Admitting: Internal Medicine

## 2014-12-08 NOTE — Telephone Encounter (Signed)
Sent to the pharmacy by e-scribe. 

## 2014-12-12 ENCOUNTER — Ambulatory Visit (INDEPENDENT_AMBULATORY_CARE_PROVIDER_SITE_OTHER): Payer: Medicare Other | Admitting: Family Medicine

## 2014-12-12 DIAGNOSIS — Z23 Encounter for immunization: Secondary | ICD-10-CM

## 2015-01-04 ENCOUNTER — Other Ambulatory Visit: Payer: Self-pay | Admitting: Internal Medicine

## 2015-01-06 NOTE — Telephone Encounter (Signed)
Refill x 1  Ov before  Needs more refills

## 2015-01-07 ENCOUNTER — Telehealth: Payer: Self-pay | Admitting: Family Medicine

## 2015-01-07 NOTE — Telephone Encounter (Signed)
Called to the pharmacy and left on machine.  Message sent to scheduling to help the pt make appt.

## 2015-01-07 NOTE — Telephone Encounter (Signed)
Pt has cpx sch for 01-14-15

## 2015-01-07 NOTE — Telephone Encounter (Signed)
Left message on machine for pt to call back.

## 2015-01-07 NOTE — Telephone Encounter (Signed)
Pt due for medication follow up.  Please help the pt to make that appointment.  Thanks!

## 2015-01-08 DIAGNOSIS — L820 Inflamed seborrheic keratosis: Secondary | ICD-10-CM | POA: Diagnosis not present

## 2015-01-08 DIAGNOSIS — D485 Neoplasm of uncertain behavior of skin: Secondary | ICD-10-CM | POA: Diagnosis not present

## 2015-01-08 DIAGNOSIS — L570 Actinic keratosis: Secondary | ICD-10-CM | POA: Diagnosis not present

## 2015-01-13 ENCOUNTER — Other Ambulatory Visit: Payer: Self-pay

## 2015-01-13 DIAGNOSIS — Z1231 Encounter for screening mammogram for malignant neoplasm of breast: Secondary | ICD-10-CM

## 2015-01-14 ENCOUNTER — Encounter: Payer: Self-pay | Admitting: Internal Medicine

## 2015-01-14 ENCOUNTER — Ambulatory Visit (INDEPENDENT_AMBULATORY_CARE_PROVIDER_SITE_OTHER): Payer: Medicare Other | Admitting: Internal Medicine

## 2015-01-14 VITALS — BP 138/80 | Temp 97.7°F | Ht 64.0 in | Wt 136.6 lb

## 2015-01-14 DIAGNOSIS — I10 Essential (primary) hypertension: Secondary | ICD-10-CM | POA: Diagnosis not present

## 2015-01-14 DIAGNOSIS — Z79899 Other long term (current) drug therapy: Secondary | ICD-10-CM

## 2015-01-14 DIAGNOSIS — R739 Hyperglycemia, unspecified: Secondary | ICD-10-CM | POA: Diagnosis not present

## 2015-01-14 DIAGNOSIS — E7800 Pure hypercholesterolemia, unspecified: Secondary | ICD-10-CM

## 2015-01-14 DIAGNOSIS — D649 Anemia, unspecified: Secondary | ICD-10-CM | POA: Diagnosis not present

## 2015-01-14 DIAGNOSIS — F411 Generalized anxiety disorder: Secondary | ICD-10-CM | POA: Diagnosis not present

## 2015-01-14 DIAGNOSIS — Z0000 Encounter for general adult medical examination without abnormal findings: Secondary | ICD-10-CM

## 2015-01-14 LAB — HEMOGLOBIN A1C: HEMOGLOBIN A1C: 5.6 % (ref 4.6–6.5)

## 2015-01-14 LAB — BASIC METABOLIC PANEL
BUN: 14 mg/dL (ref 6–23)
CO2: 30 mEq/L (ref 19–32)
CREATININE: 0.81 mg/dL (ref 0.40–1.20)
Calcium: 10 mg/dL (ref 8.4–10.5)
Chloride: 106 mEq/L (ref 96–112)
GFR: 74.33 mL/min (ref >60.00)
Glucose, Bld: 97 mg/dL (ref 70–99)
Potassium: 5.7 mEq/L — ABNORMAL HIGH (ref 3.5–5.1)
SODIUM: 142 meq/L (ref 135–145)

## 2015-01-14 LAB — IBC PANEL
Iron: 88 ug/dL (ref 42–145)
Saturation Ratios: 25.9 % (ref 20.0–50.0)
Transferrin: 243 mg/dL (ref 212.0–360.0)

## 2015-01-14 LAB — CBC WITH DIFFERENTIAL/PLATELET
Basophils Absolute: 0 10*3/uL (ref 0.0–0.1)
Basophils Relative: 0.5 % (ref 0.0–3.0)
EOS ABS: 0.2 10*3/uL (ref 0.0–0.7)
Eosinophils Relative: 2 % (ref 0.0–5.0)
HCT: 38 % (ref 36.0–46.0)
Hemoglobin: 12.4 g/dL (ref 12.0–15.0)
Lymphocytes Relative: 22.2 % (ref 12.0–46.0)
Lymphs Abs: 1.9 10*3/uL (ref 0.7–4.0)
MCHC: 32.5 g/dL (ref 30.0–36.0)
MCV: 93.3 fl (ref 78.0–100.0)
MONOS PCT: 6.4 % (ref 3.0–12.0)
Monocytes Absolute: 0.5 10*3/uL (ref 0.1–1.0)
Neutro Abs: 5.8 10*3/uL (ref 1.4–7.7)
Neutrophils Relative %: 68.9 % (ref 43.0–77.0)
PLATELETS: 303 10*3/uL (ref 150.0–400.0)
RBC: 4.07 Mil/uL (ref 3.87–5.11)
RDW: 13.6 % (ref 11.5–15.5)
WBC: 8.4 10*3/uL (ref 4.0–10.5)

## 2015-01-14 LAB — TSH: TSH: 2.77 u[IU]/mL (ref 0.35–4.50)

## 2015-01-14 LAB — LIPID PANEL
CHOL/HDL RATIO: 3
Cholesterol: 188 mg/dL (ref 0–200)
HDL: 60.2 mg/dL (ref >39.00)
LDL Cholesterol: 111 mg/dL — ABNORMAL HIGH (ref 0–99)
NonHDL: 127.34
TRIGLYCERIDES: 83 mg/dL (ref 0.0–149.0)
VLDL: 16.6 mg/dL (ref 0.0–40.0)

## 2015-01-14 LAB — FERRITIN: Ferritin: 59.1 ng/mL (ref 10.0–291.0)

## 2015-01-14 NOTE — Patient Instructions (Addendum)
Increase to buspar to 10 mg twice a day   Contact  us for refills  Long term hope to not need to take ativan  At  Night  as it can cause rebound sleep problem  Plan rov in 4-6 months or as needed If cough   persistent or progressive contact us for poss change in BP medication  Long term   Health Maintenance, Female Adopting a healthy lifestyle and getting preventive care can go a long way to promote health and wellness. Talk with your health care provider about what schedule of regular examinations is right for you. This is a good chance for you to check in with your provider about disease prevention and staying healthy. In between checkups, there are plenty of things you can do on your own. Experts have done a lot of research about which lifestyle changes and preventive measures are most likely to keep you healthy. Ask your health care provider for more information. WEIGHT AND DIET  Eat a healthy diet  Be sure to include plenty of vegetables, fruits, low-fat dairy products, and lean protein.  Do not eat a lot of foods high in solid fats, added sugars, or salt.  Get regular exercise. This is one of the most important things you can do for your health.  Most adults should exercise for at least 150 minutes each week. The exercise should increase your heart rate and make you sweat (moderate-intensity exercise).  Most adults should also do strengthening exercises at least twice a week. This is in addition to the moderate-intensity exercise.  Maintain a healthy weight  Body mass index (BMI) is a measurement that can be used to identify possible weight problems. It estimates body fat based on height and weight. Your health care provider can help determine your BMI and help you achieve or maintain a healthy weight.  For females 61 years of age and older:   A BMI below 18.5 is considered underweight.  A BMI of 18.5 to 24.9 is normal.  A BMI of 25 to 29.9 is considered overweight.  A BMI  of 30 and above is considered obese.  Watch levels of cholesterol and blood lipids  You should start having your blood tested for lipids and cholesterol at 69 years of age, then have this test every 5 years.  You may need to have your cholesterol levels checked more often if:  Your lipid or cholesterol levels are high.  You are older than 69 years of age.  You are at high risk for heart disease.  CANCER SCREENING   Lung Cancer  Lung cancer screening is recommended for adults 70-67 years old who are at high risk for lung cancer because of a history of smoking.  A yearly low-dose CT scan of the lungs is recommended for people who:  Currently smoke.  Have quit within the past 15 years.  Have at least a 30-pack-year history of smoking. A pack year is smoking an average of one pack of cigarettes a day for 1 year.  Yearly screening should continue until it has been 15 years since you quit.  Yearly screening should stop if you develop a health problem that would prevent you from having lung cancer treatment.  Breast Cancer  Practice breast self-awareness. This means understanding how your breasts normally appear and feel.  It also means doing regular breast self-exams. Let your health care provider know about any changes, no matter how small.  If you are in your 29s or  68s, you should have a clinical breast exam (CBE) by a health care provider every 1-3 years as part of a regular health exam.  If you are 6 or older, have a CBE every year. Also consider having a breast X-ray (mammogram) every year.  If you have a family history of breast cancer, talk to your health care provider about genetic screening.  If you are at high risk for breast cancer, talk to your health care provider about having an MRI and a mammogram every year.  Breast cancer gene (BRCA) assessment is recommended for women who have family members with BRCA-related cancers. BRCA-related cancers  include:  Breast.  Ovarian.  Tubal.  Peritoneal cancers.  Results of the assessment will determine the need for genetic counseling and BRCA1 and BRCA2 testing. Cervical Cancer Your health care provider may recommend that you be screened regularly for cancer of the pelvic organs (ovaries, uterus, and vagina). This screening involves a pelvic examination, including checking for microscopic changes to the surface of your cervix (Pap test). You may be encouraged to have this screening done every 3 years, beginning at age 39.  For women ages 105-65, health care providers may recommend pelvic exams and Pap testing every 3 years, or they may recommend the Pap and pelvic exam, combined with testing for human papilloma virus (HPV), every 5 years. Some types of HPV increase your risk of cervical cancer. Testing for HPV may also be done on women of any age with unclear Pap test results.  Other health care providers may not recommend any screening for nonpregnant women who are considered low risk for pelvic cancer and who do not have symptoms. Ask your health care provider if a screening pelvic exam is right for you.  If you have had past treatment for cervical cancer or a condition that could lead to cancer, you need Pap tests and screening for cancer for at least 20 years after your treatment. If Pap tests have been discontinued, your risk factors (such as having a new sexual partner) need to be reassessed to determine if screening should resume. Some women have medical problems that increase the chance of getting cervical cancer. In these cases, your health care provider may recommend more frequent screening and Pap tests. Colorectal Cancer  This type of cancer can be detected and often prevented.  Routine colorectal cancer screening usually begins at 69 years of age and continues through 69 years of age.  Your health care provider may recommend screening at an earlier age if you have risk factors for  colon cancer.  Your health care provider may also recommend using home test kits to check for hidden blood in the stool.  A small camera at the end of a tube can be used to examine your colon directly (sigmoidoscopy or colonoscopy). This is done to check for the earliest forms of colorectal cancer.  Routine screening usually begins at age 15.  Direct examination of the colon should be repeated every 5-10 years through 69 years of age. However, you may need to be screened more often if early forms of precancerous polyps or small growths are found. Skin Cancer  Check your skin from head to toe regularly.  Tell your health care provider about any new moles or changes in moles, especially if there is a change in a mole's shape or color.  Also tell your health care provider if you have a mole that is larger than the size of a pencil eraser.  Always  use sunscreen. Apply sunscreen liberally and repeatedly throughout the day.  Protect yourself by wearing long sleeves, pants, a wide-brimmed hat, and sunglasses whenever you are outside. HEART DISEASE, DIABETES, AND HIGH BLOOD PRESSURE   High blood pressure causes heart disease and increases the risk of stroke. High blood pressure is more likely to develop in:  People who have blood pressure in the high end of the normal range (130-139/85-89 mm Hg).  People who are overweight or obese.  People who are African American.  If you are 78-37 years of age, have your blood pressure checked every 3-5 years. If you are 50 years of age or older, have your blood pressure checked every year. You should have your blood pressure measured twice--once when you are at a hospital or clinic, and once when you are not at a hospital or clinic. Record the average of the two measurements. To check your blood pressure when you are not at a hospital or clinic, you can use:  An automated blood pressure machine at a pharmacy.  A home blood pressure monitor.  If you  are between 10 years and 54 years old, ask your health care provider if you should take aspirin to prevent strokes.  Have regular diabetes screenings. This involves taking a blood sample to check your fasting blood sugar level.  If you are at a normal weight and have a low risk for diabetes, have this test once every three years after 69 years of age.  If you are overweight and have a high risk for diabetes, consider being tested at a younger age or more often. PREVENTING INFECTION  Hepatitis B  If you have a higher risk for hepatitis B, you should be screened for this virus. You are considered at high risk for hepatitis B if:  You were born in a country where hepatitis B is common. Ask your health care provider which countries are considered high risk.  Your parents were born in a high-risk country, and you have not been immunized against hepatitis B (hepatitis B vaccine).  You have HIV or AIDS.  You use needles to inject street drugs.  You live with someone who has hepatitis B.  You have had sex with someone who has hepatitis B.  You get hemodialysis treatment.  You take certain medicines for conditions, including cancer, organ transplantation, and autoimmune conditions. Hepatitis C  Blood testing is recommended for:  Everyone born from [birth year] through 1965.  Anyone with known risk factors for hepatitis C. Sexually transmitted infections (STIs)  You should be screened for sexually transmitted infections (STIs) including gonorrhea and chlamydia if:  You are sexually active and are younger than 68 years of age.  You are older than 69 years of age and your health care provider tells you that you are at risk for this type of infection.  Your sexual activity has changed since you were last screened and you are at an increased risk for chlamydia or gonorrhea. Ask your health care provider if you are at risk.  If you do not have HIV, but are at risk, it may be recommended that you  take a prescription medicine daily to prevent HIV infection. This is called pre-exposure prophylaxis (PrEP). You are considered at risk if:  You are sexually active and do not regularly use condoms or know the HIV status of your partner(s).  You take drugs by injection.  You are sexually active with a partner who has HIV. Talk with your health care  provider about whether you are at high risk of being infected with HIV. If you choose to begin PrEP, you should first be tested for HIV. You should then be tested every 3 months for as long as you are taking PrEP.  PREGNANCY   If you are premenopausal and you may become pregnant, ask your health care provider about preconception counseling.  If you may become pregnant, take 400 to 800 micrograms (mcg) of folic acid every day.  If you want to prevent pregnancy, talk to your health care provider about birth control (contraception). OSTEOPOROSIS AND MENOPAUSE   Osteoporosis is a disease in which the bones lose minerals and strength with aging. This can result in serious bone fractures. Your risk for osteoporosis can be identified using a bone density scan.  If you are 40 years of age or older, or if you are at risk for osteoporosis and fractures, ask your health care provider if you should be screened.  Ask your health care provider whether you should take a calcium or vitamin D supplement to lower your risk for osteoporosis.  Menopause may have certain physical symptoms and risks.  Hormone replacement therapy may reduce some of these symptoms and risks. Talk to your health care provider about whether hormone replacement therapy is right for you.  HOME CARE INSTRUCTIONS   Schedule regular health, dental, and eye exams.  Stay current with your immunizations.   Do not use any tobacco products including cigarettes, chewing tobacco, or electronic cigarettes.  If you are pregnant, do not drink alcohol.  If you are breastfeeding, limit how  much and how often you drink alcohol.  Limit alcohol intake to no more than 1 drink per day for nonpregnant women. One drink equals 12 ounces of beer, 5 ounces of wine, or 1 ounces of hard liquor.  Do not use street drugs.  Do not share needles.  Ask your health care provider for help if you need support or information about quitting drugs.  Tell your health care provider if you often feel depressed.  Tell your health care provider if you have ever been abused or do not feel safe at home.   This information is not intended to replace advice given to you by your health care provider. Make sure you discuss any questions you have with your health care provider.   Document Released: 08/30/2010 Document Revised: 03/07/2014 Document Reviewed: 01/16/2013 [employer] Interactive Patient Education [employer].

## 2015-01-14 NOTE — Progress Notes (Signed)
Pre visit review using our clinic review tool, if applicable. No additional management support is needed unless otherwise documented below in the visit note.  Chief Complaint  Patient presents with  . Medicare Wellness    labs ful meds for anxiety     HPI: Nicole Wood 69 y.o. comes in today for Preventive Medicare wellness visit and medical fu anxiety  Ht  Etc  See prev notes    Anxiety: seems to be doing better  And feels better . Husband says she is less anxious   . No se of med  On 15 mg per day   Using small amount ativan in the pm pre bed seems to help not all days   HT taking low dose lisinopril  5 no se of med    Some throat clearing  At times but no cough  No rash ? Allergy drainage  goin to beach and then flo in feb    Back pain has been evalauted consideration for injections    Last hg was in anemia range but no bleeding  Had colon oscopy updated  Health Maintenance  Topic Date Due  . Hepatitis C Screening  21-Oct-1945  . INFLUENZA VACCINE  09/29/2015  . MAMMOGRAM  02/21/2016  . TETANUS/TDAP  02/28/2018  . COLONOSCOPY  12/02/2019  . DEXA SCAN  Completed  . ZOSTAVAX  Completed  . PNA vac Low Risk Adult  Completed   Health Maintenance Review LIFESTYLE:  TAD social  Neg t and d  Sugar beverages: ocass   Sweet.  Sleep:  7-8      MEDICARE DOCUMENT QUESTIONS  TO SCAN   Hearing: ok  Vision:  No limitations at present . Last eye check UTD  Safety:  Has smoke detector and wears seat belts.  No firearms. No excess sun exposure. Sees dentist regularly.  Falls: no  Advance directive :  Reviewed  Not yet   Memory: Felt to be good  , no concern from her or her family.  Depression: No anhedonia unusual crying or depressive symptoms  Nutrition: Eats well balanced diet; adequate calcium and vitamin D. No swallowing chewing problems.  Injury: no major injuries in the last six months.  Other healthcare providers:  Reviewed today .  Social:  Lives with spouse  married. No pets.   Preventive parameters: up-to-date  Reviewed   ADLS:   There are no problems or need for assistance  driving, feeding, obtaining food, dressing, toileting and bathing, managing money using phone. She is independent.    ROS:  GEN/ HEENT: No fever, significant weight changes sweats headaches vision problems hearing changes, CV/ PULM; No chest pain shortness of breath cough, syncope,edema  change in exercise tolerance. GI /GU: No adominal pain, vomiting, change in bowel habits. No blood in the stool. No significant GU symptoms. SKIN/HEME: ,no acute skin rashes suspicious lesions or bleeding. No lymphadenopathy, nodules, masses.  NEURO/ PSYCH:  No neurologic signs such as weakness numbness. No depression anxiety. IMM/ Allergy: No unusual infections.  Allergy .   REST of 12 system review negative except as per HPI   Past Medical History  Diagnosis Date  . Depression     in the past  . Cystocele   . Hematuria     idiopathic  . Urinary incontinence   . Basal cell cancer     nose  . Osteopenia     dexa 2009, -2.3 osteoporosis -2.5 hip 2011  . Osteoarthritis     Family  History  Problem Relation Age of Onset  . Pulmonary embolism Mother   . Depression Mother     major depressive disorder  . Cancer Father     bladder  . Diabetes Brother   . Hyperlipidemia Brother   . Arthritis Brother   . Arthritis Brother     DJD  . Hip fracture Mother     about 10   . Heart attack Neg Hx   . Stroke Maternal Aunt   . Hypertension Mother     Social History   Social History  . Marital Status: Married    Spouse Name: N/A  . Number of Children: N/A  . Years of Education: N/A   Social History Main Topics  . Smoking status: Never Smoker   . Smokeless tobacco: None  . Alcohol Use: Yes  . Drug Use: No  . Sexual Activity:    Partners: Female   Other Topics Concern  . None   Social History Narrative   Retired Arts development officer) some college   Married 4 children [place]  [place],, Midwife, and [place], and [place] Husband cancer MM. NHL in remission   Regular exercise- yes   HH of 2   No pets    Multiple myeloma and non hogkins lymphoma in husband x 6 years   Regular exercise: walk, swim, some biking   Caffeine use: coffee daily, sweet tea    Outpatient Encounter Prescriptions as of 01/14/2015  Medication Sig  . bifidobacterium infantis (ALIGN) capsule Take 1 capsule by mouth daily.  . busPIRone (BUSPAR) 5 MG tablet TAKE 1 AND 1/2 TABLETS BY MOUTH TWICE DAILY. CAN INCREASE AS DIRECTED TO 20MG PER DAY  . calcium-vitamin D (OSCAL WITH D) 500-200 MG-UNIT per tablet Take 1 tablet by mouth daily.    . cyclobenzaprine (FLEXERIL) 10 MG tablet Take 5 mg by mouth at bedtime as needed (TMJ).   Marland Kitchen ibuprofen (ADVIL,MOTRIN) 200 MG tablet Take 200-400 mg by mouth 2 (two) times daily as needed (hip and back pain).  Marland Kitchen lisinopril (PRINIVIL,ZESTRIL) 10 MG tablet TAKE 1 TABLET BY MOUTH DAILY, MAY INCREASE TO 2 TABLETS DAILY AS DIRECTED (Patient taking differently: Take 10 mg by mouth daily after supper. MAY INCREASE TO 2 TABLETS DAILY AS DIRECTED)  . LORazepam (ATIVAN) 0.5 MG tablet TAKE 1 TABLET BY MOUTH EVERY 8 HOURS AS NEEDED FOR ANXIETY  . metoprolol succinate (TOPROL XL) 25 MG 24 hr tablet Take 1 tablet (25 mg total) by mouth daily. (Patient taking differently: Take 25 mg by mouth daily as needed (palpitations). )  . Multiple Vitamins-Minerals (ICAPS PO) Take 1 capsule by mouth 2 (two) times daily.   . naproxen sodium (ANAPROX) 220 MG tablet Take 220 mg by mouth 2 (two) times daily as needed (pain). ALEVE  . polyethylene glycol (MIRALAX / GLYCOLAX) packet Take 8.5 g by mouth daily.   No facility-administered encounter medications on file as of 01/14/2015.    EXAM:  BP 138/80 mmHg  Temp(Src) 97.7 F (36.5 C) (Oral)  Ht '5\' 4"'  (1.626 m)  Wt 136 lb 9.6 oz (61.961 kg)  BMI 23.44 kg/m2  Body mass index is 23.44 kg/(m^2).  Physical Exam: Vital signs  reviewed KPT:WSFK is a well-developed well-nourished alert cooperative   who appears stated age in no acute distress.  HEENT: normocephalic atraumatic , Eyes: PERRL EOM's full, conjunctiva clear, Nares: paten,t no deformity discharge or tenderness., Ears: no deformity EAC's clear TMs with normal landmarks. Mouth: clear OP, no lesions, edema.  Moist mucous membranes.  Dentition in adequate repair. NECK: supple without masses, thyromegaly or bruits. CHEST/PULM:  Clear to auscultation and percussion breath sounds equal no wheeze , rales or rhonchi. No chest wall deformities or tenderness. CV: PMI is nondisplaced, S1 S2 no gallops, murmurs, rubs. Peripheral pulses are full without delay.No JVD . Breast: normal by inspection . No dimpling, discharge, masses, tenderness or discharge . ABDOMEN: Bowel sounds normal nontender  No guard or rebound, no hepato splenomegal no CVA tenderness.   Extremtities:  No clubbing cyanosis or edema, no acute joint swelling or redness no focal atrophy NEURO:  Oriented x3, cranial nerves 3-12 appear to be intact, no obvious focal weakness,gait within normal limits no abnormal reflexes or asymmetrical SKIN: No acute rashes normal turgor, color, no bruising or petechiae. PSYCH: Oriented, good eye contact, no obvious depression anxiety, much calmer than  In previous  Evaluations  cognition and judgment appear normal. LN: no cervical axillary inguinal adenopathy No noted deficits in memory, attention, and speech.   Lab Results  Component Value Date   WBC 8.4 01/14/2015   HGB 12.4 01/14/2015   HCT 38.0 01/14/2015   PLT 303.0 01/14/2015   GLUCOSE 97 01/14/2015   CHOL 188 01/14/2015   TRIG 83.0 01/14/2015   HDL 60.20 01/14/2015   LDLDIRECT 137.2 12/14/2010   LDLCALC 111* 01/14/2015   ALT 11 01/09/2014   AST 14 01/09/2014   NA 142 01/14/2015   K 5.7* 01/14/2015   CL 106 01/14/2015   CREATININE 0.81 01/14/2015   BUN 14 01/14/2015   CO2 30 01/14/2015   TSH 2.77  01/14/2015   HGBA1C 5.6 01/14/2015    ASSESSMENT AND PLAN:  Discussed the following assessment and plan:  Medicare annual wellness visit, subsequent  Anxiety state - much improved inc to 20 per day buspar limit use ativan as tolerated  - Plan: Basic metabolic panel, CBC with Differential/Platelet, Hemoglobin A1c, Lipid panel, TSH  Essential hypertension - better  low dose acei lsi minor upper airway sx of continues consider other meds - Plan: Basic metabolic panel  Elevated cholesterol - lsi check today - Plan: Lipid panel, TSH  Medication management - Plan: Basic metabolic panel, CBC with Differential/Platelet, Hemoglobin A1c, Lipid panel, TSH  Hyperglycemia - check K0X  no obv metabolic syndrome - Plan: Basic metabolic panel, Hemoglobin A1c  Anemia, unspecified anemia type - recheck to see if resolved  utd and no sx of concern  - Plan: CBC with Differential/Platelet, Ferritin, IBC panel Patient Care Team: Burnis Medin, MD as PCP - General Ninetta Lights, MD (Orthopedic Surgery) Rolm Bookbinder, MD (Dermatology) Frederik Schmidt, MD as Attending Physician (Oral Surgery)  Patient Instructions  Increase to buspar to 10 mg twice a day   Contact  us for refills  Long term hope to not need to take ativan  At  Night  as it can cause rebound sleep problem  Plan rov in 4-6 months or as needed If cough   persistent or progressive contact us for poss change in BP medication  Long term   Health Maintenance, Female Adopting a healthy lifestyle and getting preventive care can go a long way to promote health and wellness. Talk with your health care provider about what schedule of regular examinations is right for you. This is a good chance for you to check in with your provider about disease prevention and staying healthy. In between checkups, there are plenty of things you can do on your own. Experts have done a lot of  research about which lifestyle changes and preventive measures are most likely  to keep you healthy. Ask your health care provider for more information. WEIGHT AND DIET  Eat a healthy diet  Be sure to include plenty of vegetables, fruits, low-fat dairy products, and lean protein.  Do not eat a lot of foods high in solid fats, added sugars, or salt.  Get regular exercise. This is one of the most important things you can do for your health.  Most adults should exercise for at least 150 minutes each week. The exercise should increase your heart rate and make you sweat (moderate-intensity exercise).  Most adults should also do strengthening exercises at least twice a week. This is in addition to the moderate-intensity exercise.  Maintain a healthy weight  Body mass index (BMI) is a measurement that can be used to identify possible weight problems. It estimates body fat based on height and weight. Your health care provider can help determine your BMI and help you achieve or maintain a healthy weight.  For females 38 years of age and older:   A BMI below 18.5 is considered underweight.  A BMI of 18.5 to 24.9 is normal.  A BMI of 25 to 29.9 is considered overweight.  A BMI of 30 and above is considered obese.  Watch levels of cholesterol and blood lipids  You should start having your blood tested for lipids and cholesterol at 69 years of age, then have this test every 5 years.  You may need to have your cholesterol levels checked more often if:  Your lipid or cholesterol levels are high.  You are older than 69 years of age.  You are at high risk for heart disease.  CANCER SCREENING   Lung Cancer  Lung cancer screening is recommended for adults 58-42 years old who are at high risk for lung cancer because of a history of smoking.  A yearly low-dose CT scan of the lungs is recommended for people who:  Currently smoke.  Have quit within the past 15 years.  Have at least a 30-pack-year history of smoking. A pack year is smoking an average of one pack of  cigarettes a day for 1 year.  Yearly screening should continue until it has been 15 years since you quit.  Yearly screening should stop if you develop a health problem that would prevent you from having lung cancer treatment.  Breast Cancer  Practice breast self-awareness. This means understanding how your breasts normally appear and feel.  It also means doing regular breast self-exams. Let your health care provider know about any changes, no matter how small.  If you are in your 20s or 30s, you should have a clinical breast exam (CBE) by a health care provider every 1-3 years as part of a regular health exam.  If you are 56 or older, have a CBE every year. Also consider having a breast X-ray (mammogram) every year.  If you have a family history of breast cancer, talk to your health care provider about genetic screening.  If you are at high risk for breast cancer, talk to your health care provider about having an MRI and a mammogram every year.  Breast cancer gene (BRCA) assessment is recommended for women who have family members with BRCA-related cancers. BRCA-related cancers include:  Breast.  Ovarian.  Tubal.  Peritoneal cancers.  Results of the assessment will determine the need for genetic counseling and BRCA1 and BRCA2 testing. Cervical Cancer Your health care provider may  recommend that you be screened regularly for cancer of the pelvic organs (ovaries, uterus, and vagina). This screening involves a pelvic examination, including checking for microscopic changes to the surface of your cervix (Pap test). You may be encouraged to have this screening done every 3 years, beginning at age 62.  For women ages 99-65, health care providers may recommend pelvic exams and Pap testing every 3 years, or they may recommend the Pap and pelvic exam, combined with testing for human papilloma virus (HPV), every 5 years. Some types of HPV increase your risk of cervical cancer. Testing for HPV  may also be done on women of any age with unclear Pap test results.  Other health care providers may not recommend any screening for nonpregnant women who are considered low risk for pelvic cancer and who do not have symptoms. Ask your health care provider if a screening pelvic exam is right for you.  If you have had past treatment for cervical cancer or a condition that could lead to cancer, you need Pap tests and screening for cancer for at least 20 years after your treatment. If Pap tests have been discontinued, your risk factors (such as having a new sexual partner) need to be reassessed to determine if screening should resume. Some women have medical problems that increase the chance of getting cervical cancer. In these cases, your health care provider may recommend more frequent screening and Pap tests. Colorectal Cancer  This type of cancer can be detected and often prevented.  Routine colorectal cancer screening usually begins at 69 years of age and continues through 69 years of age.  Your health care provider may recommend screening at an earlier age if you have risk factors for colon cancer.  Your health care provider may also recommend using home test kits to check for hidden blood in the stool.  A small camera at the end of a tube can be used to examine your colon directly (sigmoidoscopy or colonoscopy). This is done to check for the earliest forms of colorectal cancer.  Routine screening usually begins at age 50.  Direct examination of the colon should be repeated every 5-10 years through 69 years of age. However, you may need to be screened more often if early forms of precancerous polyps or small growths are found. Skin Cancer  Check your skin from head to toe regularly.  Tell your health care provider about any new moles or changes in moles, especially if there is a change in a mole's shape or color.  Also tell your health care provider if you have a mole that is larger than  the size of a pencil eraser.  Always use sunscreen. Apply sunscreen liberally and repeatedly throughout the day.  Protect yourself by wearing long sleeves, pants, a wide-brimmed hat, and sunglasses whenever you are outside. HEART DISEASE, DIABETES, AND HIGH BLOOD PRESSURE   High blood pressure causes heart disease and increases the risk of stroke. High blood pressure is more likely to develop in:  People who have blood pressure in the high end of the normal range (130-139/85-89 mm Hg).  People who are overweight or obese.  People who are African American.  If you are 81-62 years of age, have your blood pressure checked every 3-5 years. If you are 61 years of age or older, have your blood pressure checked every year. You should have your blood pressure measured twice--once when you are at a hospital or clinic, and once when you are not at  a hospital or clinic. Record the average of the two measurements. To check your blood pressure when you are not at a hospital or clinic, you can use:  An automated blood pressure machine at a pharmacy.  A home blood pressure monitor.  If you are between 80 years and 43 years old, ask your health care provider if you should take aspirin to prevent strokes.  Have regular diabetes screenings. This involves taking a blood sample to check your fasting blood sugar level.  If you are at a normal weight and have a low risk for diabetes, have this test once every three years after 69 years of age.  If you are overweight and have a high risk for diabetes, consider being tested at a younger age or more often. PREVENTING INFECTION  Hepatitis B  If you have a higher risk for hepatitis B, you should be screened for this virus. You are considered at high risk for hepatitis B if:  You were born in a country where hepatitis B is common. Ask your health care provider which countries are considered high risk.  Your parents were born in a high-risk country, and you  have not been immunized against hepatitis B (hepatitis B vaccine).  You have HIV or AIDS.  You use needles to inject street drugs.  You live with someone who has hepatitis B.  You have had sex with someone who has hepatitis B.  You get hemodialysis treatment.  You take certain medicines for conditions, including cancer, organ transplantation, and autoimmune conditions. Hepatitis C  Blood testing is recommended for:  Everyone born from [birth year] through 1965.  Anyone with known risk factors for hepatitis C. Sexually transmitted infections (STIs)  You should be screened for sexually transmitted infections (STIs) including gonorrhea and chlamydia if:  You are sexually active and are younger than 69 years of age.  You are older than 69 years of age and your health care provider tells you that you are at risk for this type of infection.  Your sexual activity has changed since you were last screened and you are at an increased risk for chlamydia or gonorrhea. Ask your health care provider if you are at risk.  If you do not have HIV, but are at risk, it may be recommended that you take a prescription medicine daily to prevent HIV infection. This is called pre-exposure prophylaxis (PrEP). You are considered at risk if:  You are sexually active and do not regularly use condoms or know the HIV status of your partner(s).  You take drugs by injection.  You are sexually active with a partner who has HIV. Talk with your health care provider about whether you are at high risk of being infected with HIV. If you choose to begin PrEP, you should first be tested for HIV. You should then be tested every 3 months for as long as you are taking PrEP.  PREGNANCY   If you are premenopausal and you may become pregnant, ask your health care provider about preconception counseling.  If you may become pregnant, take 400 to 800 micrograms (mcg) of folic acid every day.  If you want to prevent pregnancy,  talk to your health care provider about birth control (contraception). OSTEOPOROSIS AND MENOPAUSE   Osteoporosis is a disease in which the bones lose minerals and strength with aging. This can result in serious bone fractures. Your risk for osteoporosis can be identified using a bone density scan.  If you are 65 years of  age or older, or if you are at risk for osteoporosis and fractures, ask your health care provider if you should be screened.  Ask your health care provider whether you should take a calcium or vitamin D supplement to lower your risk for osteoporosis.  Menopause may have certain physical symptoms and risks.  Hormone replacement therapy may reduce some of these symptoms and risks. Talk to your health care provider about whether hormone replacement therapy is right for you.  HOME CARE INSTRUCTIONS   Schedule regular health, dental, and eye exams.  Stay current with your immunizations.   Do not use any tobacco products including cigarettes, chewing tobacco, or electronic cigarettes.  If you are pregnant, do not drink alcohol.  If you are breastfeeding, limit how much and how often you drink alcohol.  Limit alcohol intake to no more than 1 drink per day for nonpregnant women. One drink equals 12 ounces of beer, 5 ounces of wine, or 1 ounces of hard liquor.  Do not use street drugs.  Do not share needles.  Ask your health care provider for help if you need support or information about quitting drugs.  Tell your health care provider if you often feel depressed.  Tell your health care provider if you have ever been abused or do not feel safe at home.   This information is not intended to replace advice given to you by your health care provider. Make sure you discuss any questions you have with your health care provider.   Document Released: 08/30/2010 Document Revised: 03/07/2014 Document Reviewed: 01/16/2013 Elsevier Interactive Patient Education 2016 [place] K. Nikyla Navedo M.D.

## 2015-01-17 DIAGNOSIS — F411 Generalized anxiety disorder: Secondary | ICD-10-CM | POA: Insufficient documentation

## 2015-01-19 ENCOUNTER — Other Ambulatory Visit: Payer: Self-pay | Admitting: Family Medicine

## 2015-01-19 DIAGNOSIS — E875 Hyperkalemia: Secondary | ICD-10-CM

## 2015-01-21 ENCOUNTER — Other Ambulatory Visit: Payer: Self-pay | Admitting: Family Medicine

## 2015-01-21 DIAGNOSIS — E875 Hyperkalemia: Secondary | ICD-10-CM

## 2015-01-26 ENCOUNTER — Other Ambulatory Visit (INDEPENDENT_AMBULATORY_CARE_PROVIDER_SITE_OTHER): Payer: Medicare Other

## 2015-01-26 ENCOUNTER — Telehealth: Payer: Self-pay | Admitting: Internal Medicine

## 2015-01-26 DIAGNOSIS — E875 Hyperkalemia: Secondary | ICD-10-CM

## 2015-01-26 LAB — BASIC METABOLIC PANEL
BUN: 17 mg/dL (ref 6–23)
CO2: 27 mEq/L (ref 19–32)
CREATININE: 0.76 mg/dL (ref 0.40–1.20)
Calcium: 9.2 mg/dL (ref 8.4–10.5)
Chloride: 104 mEq/L (ref 96–112)
GFR: 79.99 mL/min (ref >60.00)
Glucose, Bld: 74 mg/dL (ref 70–99)
Potassium: 3.8 mEq/L (ref 3.5–5.1)
Sodium: 140 mEq/L (ref 135–145)

## 2015-01-26 NOTE — Telephone Encounter (Signed)
Called patient to schedule appointment. Ok'd per [facility]

## 2015-01-26 NOTE — Telephone Encounter (Signed)
Patient would like to schedule appointment with MD to follow up on lab results. MD only has SDA can patient be schedule.

## 2015-01-26 NOTE — Telephone Encounter (Signed)
Lab work was taken today.  Please make follow up near the end of the week so results will have time to come back.  Thanks!

## 2015-02-04 ENCOUNTER — Encounter: Payer: Self-pay | Admitting: Internal Medicine

## 2015-02-04 ENCOUNTER — Ambulatory Visit (INDEPENDENT_AMBULATORY_CARE_PROVIDER_SITE_OTHER): Payer: Medicare Other | Admitting: Internal Medicine

## 2015-02-04 VITALS — BP 134/82 | Temp 97.7°F | Wt 137.9 lb

## 2015-02-04 DIAGNOSIS — F411 Generalized anxiety disorder: Secondary | ICD-10-CM | POA: Diagnosis not present

## 2015-02-04 DIAGNOSIS — Z79899 Other long term (current) drug therapy: Secondary | ICD-10-CM

## 2015-02-04 DIAGNOSIS — F4323 Adjustment disorder with mixed anxiety and depressed mood: Secondary | ICD-10-CM | POA: Diagnosis not present

## 2015-02-04 DIAGNOSIS — I10 Essential (primary) hypertension: Secondary | ICD-10-CM | POA: Diagnosis not present

## 2015-02-04 MED ORDER — CITALOPRAM HYDROBROMIDE 10 MG PO TABS: 10.0000 mg | ORAL_TABLET | Freq: Every day | ORAL | Status: DC

## 2015-02-04 NOTE — Patient Instructions (Signed)
Get appt .  With psychiatry  About  Medication consult .  But I  still advise  counselor   To help with other strategies in the meantime . Can decrease the buspar to 1/2 dose    Twice a day.  Can try  celexa low dose to start and increase as tolerated .

## 2015-02-04 NOTE — Progress Notes (Signed)
Pre visit review using our clinic review tool, if applicable. No additional management support is needed unless otherwise documented below in the visit note.  Chief Complaint  Patient presents with  . Follow-up    anxiety    HPI: Nicole Wood a.ge comes in with here with husband  To also speak .hard  anxiety  And worrying .   Feeling some depressed also with this   Initially buspar helped but now  Worrying about most things  Esp bodily sx and catastrophic thinking about health    On buspar 7.5 bid  Has some depression   , not using ativan much some help sleep worry other things  beginning to escalate  And awake with dry mouth .     Scares here   remeron .in past  Mom had depress anx in later life hard to trx   Was ion zoloft and didn't help . Daughter has bipolar doing better on lamictal   Dr Toy Care  ROS: See pertinent positives and negatives per HPI.  Past Medical History  Diagnosis Date  . Depression     in the past  . Cystocele   . Hematuria     idiopathic  . Urinary incontinence   . Basal cell cancer     nose  . Osteopenia     dexa 2009, -2.3 osteoporosis -2.5 hip 2011  . Osteoarthritis     Family History  Problem Relation Age of Onset  . Pulmonary embolism Mother   . Depression Mother     major depressive disorder  . Cancer Father     bladder  . Diabetes Brother   . Hyperlipidemia Brother   . Arthritis Brother   . Arthritis Brother     DJD  . Hip fracture Mother     about 69   . Heart attack Neg Hx   . Stroke Maternal Aunt   . Hypertension Mother     Social History   Social History  . Marital Status: Married    Spouse Name: N/A  . Number of Children: N/A  . Years of Education: N/A   Social History Main Topics  . Smoking status: Never Smoker   . Smokeless tobacco: None  . Alcohol Use: Yes  . Drug Use: No  . Sexual Activity:    Partners: Female   Other Topics Concern  . None   Social History Narrative   Retired Arts development officer) some college   Married  4 children [place],, Midwife, and [place], and [place] Husband cancer MM. NHL in remission   Regular exercise- yes   HH of 2   No pets    Multiple myeloma and non hogkins lymphoma in husband x 6 years   Regular exercise: walk, swim, some biking   Caffeine use: coffee daily, sweet tea    Outpatient Prescriptions Prior to Visit  Medication Sig Dispense Refill  . bifidobacterium infantis (ALIGN) capsule Take 1 capsule by mouth daily.    . busPIRone (BUSPAR) 5 MG tablet TAKE 1 AND 1/2 TABLETS BY MOUTH TWICE DAILY. CAN INCREASE AS DIRECTED TO 20MG PER DAY 90 tablet 1  . calcium-vitamin D (OSCAL WITH D) 500-200 MG-UNIT per tablet Take 1 tablet by mouth daily.      . cyclobenzaprine (FLEXERIL) 10 MG tablet Take 5 mg by mouth at bedtime as needed (TMJ).     Marland Kitchen ibuprofen (ADVIL,MOTRIN) 200 MG tablet Take 200-400 mg by mouth 2 (two) times daily as needed (hip and back pain).    Marland Kitchen  lisinopril (PRINIVIL,ZESTRIL) 10 MG tablet TAKE 1 TABLET BY MOUTH DAILY, MAY INCREASE TO 2 TABLETS DAILY AS DIRECTED (Patient taking differently: Take 10 mg by mouth daily after supper. MAY INCREASE TO 2 TABLETS DAILY AS DIRECTED) 180 tablet 0  . LORazepam (ATIVAN) 0.5 MG tablet TAKE 1 TABLET BY MOUTH EVERY 8 HOURS AS NEEDED FOR ANXIETY 24 tablet 0  . metoprolol succinate (TOPROL XL) 25 MG 24 hr tablet Take 1 tablet (25 mg total) by mouth daily. (Patient taking differently: Take 25 mg by mouth daily as needed (palpitations). ) 30 tablet 6  . Multiple Vitamins-Minerals (ICAPS PO) Take 1 capsule by mouth 2 (two) times daily.     . naproxen sodium (ANAPROX) 220 MG tablet Take 220 mg by mouth 2 (two) times daily as needed (pain). ALEVE    . polyethylene glycol (MIRALAX / GLYCOLAX) packet Take 8.5 g by mouth daily.     No facility-administered medications prior to visit.     EXAM:  BP 134/82 mmHg  Temp(Src) 97.7 F (36.5 C) (Oral)  Wt 137 lb 14.4 oz (62.551 kg)  Body mass index is 23.66 kg/(m^2).  GENERAL: vitals  reviewed and listed above, alert, oriented, appears well hydrated and in no acute distress HEENT: atraumatic, conjunctiva  clear, no obvious abnormalities on inspection of external nose and ears PSYCH: pleasant and cooperative,   Some anxious here with husband  Lab Results  Component Value Date   WBC 8.4 01/14/2015   HGB 12.4 01/14/2015   HCT 38.0 01/14/2015   PLT 303.0 01/14/2015   GLUCOSE 74 01/26/2015   CHOL 188 01/14/2015   TRIG 83.0 01/14/2015   HDL 60.20 01/14/2015   LDLDIRECT 137.2 12/14/2010   LDLCALC 111* 01/14/2015   ALT 11 01/09/2014   AST 14 01/09/2014   NA 140 01/26/2015   K 3.8 01/26/2015   CL 104 01/26/2015   CREATININE 0.76 01/26/2015   BUN 17 01/26/2015   CO2 27 01/26/2015   TSH 2.77 01/14/2015   HGBA1C 5.6 01/14/2015    ASSESSMENT AND PLAN:  Discussed the following assessment and plan:  Anxiety state  Adjustment disorder with mixed anxiety and depressed mood  Medication management  Essential hypertension - better options given   Agree seek psych med consult  But can try transition in the interim   Risk benefit of medication discussed.  Pt prefers to see psych but if delay in med consult   Consider transition in interim,.    Total visit 74mns > 50% spent counseling and coordinating care as indicated in above note and in instructions to patient .  -Patient advised to return or notify health care team  if symptoms worsen ,persist or new concerns arise.  Patient Instructions  Get appt .  With psychiatry  About  Medication consult .  But I  still advise  counselor   To help with other strategies in the meantime . Can decrease the buspar to 1/2 dose    Twice a day.  Can try  celexa low dose to start and increase as tolerated .       WStandley Brooking Tevis Conger M.D.

## 2015-02-25 ENCOUNTER — Ambulatory Visit: Payer: Medicare Other

## 2015-03-06 ENCOUNTER — Other Ambulatory Visit: Payer: Self-pay | Admitting: Internal Medicine

## 2015-03-06 NOTE — Telephone Encounter (Signed)
Sent to the pharmacy by e-scribe. 

## 2015-03-18 DIAGNOSIS — H353131 Nonexudative age-related macular degeneration, bilateral, early dry stage: Secondary | ICD-10-CM | POA: Diagnosis not present

## 2015-03-18 DIAGNOSIS — H2513 Age-related nuclear cataract, bilateral: Secondary | ICD-10-CM | POA: Diagnosis not present

## 2015-03-18 DIAGNOSIS — H04123 Dry eye syndrome of bilateral lacrimal glands: Secondary | ICD-10-CM | POA: Diagnosis not present

## 2015-03-18 DIAGNOSIS — H01001 Unspecified blepharitis right upper eyelid: Secondary | ICD-10-CM | POA: Diagnosis not present

## 2015-03-19 ENCOUNTER — Other Ambulatory Visit: Payer: Self-pay | Admitting: Internal Medicine

## 2015-03-19 NOTE — Telephone Encounter (Signed)
Please get update of how she is doing and if she has seen psychiatrist  Or behavioral health prescriber     Can refill x 1 disp 24

## 2015-03-31 ENCOUNTER — Ambulatory Visit
Admission: RE | Admit: 2015-03-31 | Discharge: 2015-03-31 | Disposition: A | Discharge status: Home / Self Care | Payer: Medicare Other | Source: Ambulatory Visit

## 2015-03-31 DIAGNOSIS — Z1231 Encounter for screening mammogram for malignant neoplasm of breast: Secondary | ICD-10-CM | POA: Diagnosis not present

## 2015-03-31 NOTE — Telephone Encounter (Signed)
I said you could refill x 1   Disp 24 Only but need update as to her fu . Please contact her about this.  May not need ov if  Seeing other specialist

## 2015-04-01 NOTE — Telephone Encounter (Signed)
Voicemail was left for pt to return my call   

## 2015-04-09 ENCOUNTER — Ambulatory Visit (INDEPENDENT_AMBULATORY_CARE_PROVIDER_SITE_OTHER): Payer: Medicare Other | Admitting: Internal Medicine

## 2015-04-09 ENCOUNTER — Encounter: Payer: Self-pay | Admitting: Internal Medicine

## 2015-04-09 VITALS — BP 134/72 | Temp 97.6°F | Wt 139.2 lb

## 2015-04-09 DIAGNOSIS — R05 Cough: Secondary | ICD-10-CM | POA: Diagnosis not present

## 2015-04-09 DIAGNOSIS — T464X5A Adverse effect of angiotensin-converting-enzyme inhibitors, initial encounter: Secondary | ICD-10-CM

## 2015-04-09 DIAGNOSIS — F411 Generalized anxiety disorder: Secondary | ICD-10-CM | POA: Diagnosis not present

## 2015-04-09 DIAGNOSIS — I10 Essential (primary) hypertension: Secondary | ICD-10-CM

## 2015-04-09 DIAGNOSIS — Z79899 Other long term (current) drug therapy: Secondary | ICD-10-CM

## 2015-04-09 MED ORDER — LOSARTAN POTASSIUM 50 MG PO TABS: 50.0000 mg | ORAL_TABLET | Freq: Every day | ORAL | Status: DC

## 2015-04-09 MED ORDER — CITALOPRAM HYDROBROMIDE 10 MG PO TABS: 10.0000 mg | ORAL_TABLET | Freq: Every day | ORAL | Status: DC

## 2015-04-09 NOTE — Patient Instructions (Signed)
GLAD YOU ARE DOING BETTER  We can stay on 10 mg per day with the option to increase to 20 mg per day  .  Change bp medication at your convenience  From  Lisinopril to  Losartan.  May take   A few weeks for cough to go away .   Have fun in [place]!

## 2015-04-09 NOTE — Progress Notes (Signed)
Pre visit review using our clinic review tool, if applicable. No additional management support is needed unless otherwise documented below in the visit note.  Chief Complaint  Patient presents with  . Follow-up    meds anxiety cough thora with lisinopril    HPI:  Nicole Wood 70 y.o. comes in today for fy of anxiety and new med  She decided to not go to psychiatry yet because she is feeling better on 10 mg celexa 10 per day since dec 17  Less anxious and able to cope with stress better .  Throat sx getting better  Sleeping better  Rare use of ativan now  Lisinopril cough thoat clearing .    Continues    Going to [facility] for 3-4 weeks this week  bp ok  ROS: See pertinent positives and negatives per HPI. No new cp sob syncope   Past Medical History  Diagnosis Date  . Depression     in the past  . Cystocele   . Hematuria     idiopathic  . Urinary incontinence   . Basal cell cancer     nose  . Osteopenia     dexa 2009, -2.3 osteoporosis -2.5 hip 2011  . Osteoarthritis     Family History  Problem Relation Age of Onset  . Pulmonary embolism Mother   . Depression Mother     major depressive disorder  . Cancer Father     bladder  . Diabetes Brother   . Hyperlipidemia Brother   . Arthritis Brother   . Arthritis Brother     DJD  . Hip fracture Mother     about 74   . Heart attack Neg Hx   . Stroke Maternal Aunt   . Hypertension Mother     Social History   Social History  . Marital Status: Married    Spouse Name: N/A  . Number of Children: N/A  . Years of Education: N/A   Social History Main Topics  . Smoking status: Never Smoker   . Smokeless tobacco: None  . Alcohol Use: Yes  . Drug Use: No  . Sexual Activity:    Partners: Female   Other Topics Concern  . 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 NHL in remission   Regular exercise- yes   HH of  2   No pets    Multiple myeloma and non hogkins lymphoma in husband x 6 years   Regular exercise: walk, swim, some biking   Caffeine use: coffee daily, sweet tea    Outpatient Prescriptions Prior to Visit  Medication Sig Dispense Refill  . bifidobacterium infantis (ALIGN) capsule Take 1 capsule by mouth daily.    . calcium-vitamin D (OSCAL WITH D) 500-200 MG-UNIT per tablet Take 1 tablet by mouth daily.      . cyclobenzaprine (FLEXERIL) 10 MG tablet Take 5 mg by mouth at bedtime as needed (TMJ).     Marland Kitchen ibuprofen (ADVIL,MOTRIN) 200 MG tablet Take 200-400 mg by mouth 2 (two) times daily as needed (hip and back pain).    . LORazepam (ATIVAN) 0.5 MG tablet TAKE 1 TABLET BY MOUTH EVERY 8 HOURS AS NEEDED FOR ANXIETY 24 tablet 0  . metoprolol succinate (TOPROL XL) 25 MG 24 hr tablet Take 1 tablet (25 mg total) by mouth daily. (Patient taking differently: Take 25 mg by mouth daily as needed (  palpitations). ) 30 tablet 6  . Multiple Vitamins-Minerals (ICAPS PO) Take 1 capsule by mouth 2 (two) times daily.     . naproxen sodium (ANAPROX) 220 MG tablet Take 220 mg by mouth 2 (two) times daily as needed (pain). ALEVE    . polyethylene glycol (MIRALAX / GLYCOLAX) packet Take 8.5 g by mouth daily.    . citalopram (CELEXA) 10 MG tablet Take 1 tablet (10 mg total) by mouth daily. Can increase to 20 mg per day 30 tablet 2  . lisinopril (PRINIVIL,ZESTRIL) 10 MG tablet TAKE 1 TABLET BY MOUTH EVERY DAY, MAY INCREASE TO 2 TABLETS DAILY AS DIRECTED 180 tablet 2  . busPIRone (BUSPAR) 5 MG tablet Wean as directed 90 tablet 1   No facility-administered medications prior to visit.     EXAM:  BP 134/72 mmHg  Temp(Src) 97.6 F (36.4 C) (Oral)  Wt 139 lb 3.2 oz (63.141 kg)  Body mass index is 23.88 kg/(m^2).  GENERAL: vitals reviewed and listed above, alert, oriented, appears well hydrated and in no acute distress HEENT: atraumatic, conjunctiva  clear, no obvious abnormalities on inspection of external nose  and ears PSYCH: pleasant and cooperative, no obvious depression or anxiety much calmer   than last visit nl cognition   BP Readings from Last 3 Encounters:  04/09/15 134/72  02/04/15 134/82  01/14/15 138/80   Wt Readings from Last 3 Encounters:  04/09/15 139 lb 3.2 oz (63.141 kg)  02/04/15 137 lb 14.4 oz (62.551 kg)  01/14/15 136 lb 9.6 oz (61.961 kg)   phq SADS   Done  Not difficult no panic attacks  Somatic  3 GAD 4 PHQ9 1   ASSESSMENT AND PLAN:  Discussed the following assessment and plan: Anxiety state - doing much better no panic  phq-SADSstay on 10 celexa with option for [place] to increase  Essential hypertension - chge  to losartan and rov in 2 months   Medication management  ACE-inhibitor cough Rare to ocass  Use of lorazepam.  -Patient advised to return or notify health care team  if symptoms worsen ,persist or new concerns arise. Total visit 2mns > 50% spent counseling and coordinating care as indicated in above note and in instructions to patient .   Patient Instructions  GLAD YOU ARE DOING BETTER  We can stay on 10 mg per day with the option to increase to 20 mg per day  .  Change bp medication at your convenience  From  Lisinopril to  Losartan.  May take   A few weeks for cough to go away .   [place]    WStandley Brooking Samael Blades M.D.

## 2015-06-11 DIAGNOSIS — D2261 Melanocytic nevi of right upper limb, including shoulder: Secondary | ICD-10-CM | POA: Diagnosis not present

## 2015-06-11 DIAGNOSIS — L814 Other melanin hyperpigmentation: Secondary | ICD-10-CM | POA: Diagnosis not present

## 2015-06-11 DIAGNOSIS — Z85828 Personal history of other malignant neoplasm of skin: Secondary | ICD-10-CM | POA: Diagnosis not present

## 2015-06-11 DIAGNOSIS — D225 Melanocytic nevi of trunk: Secondary | ICD-10-CM | POA: Diagnosis not present

## 2015-06-11 DIAGNOSIS — L821 Other seborrheic keratosis: Secondary | ICD-10-CM | POA: Diagnosis not present

## 2015-06-11 DIAGNOSIS — D1801 Hemangioma of skin and subcutaneous tissue: Secondary | ICD-10-CM | POA: Diagnosis not present

## 2015-06-11 DIAGNOSIS — L819 Disorder of pigmentation, unspecified: Secondary | ICD-10-CM | POA: Diagnosis not present

## 2015-06-16 NOTE — Progress Notes (Signed)
Pre visit review using our clinic review tool, if applicable. No additional management support is needed unless otherwise documented below in the visit note.   Chief Complaint  Patient presents with  . Follow-up    HPI: Nicole Wood 70 y.o.  Fu bp med and cough poss acei se   Cough  Gone     Just allergy now  Increased the celexa to 15  Mg  About  March 18 th.  Some improvement.   Thinks doing well considering familu stresses  1/2  Rare  Ativan  If needed  Daughter  In law   New dx breast cancer but doing well .  Nephew   38  Colon cancer . [facility].  ROS: See pertinent positives and negatives per HPI.  Past Medical History  Diagnosis Date  . Depression     in the past  . Cystocele   . Hematuria     idiopathic  . Urinary incontinence   . Basal cell cancer     nose  . Osteopenia     dexa 2009, -2.3 osteoporosis -2.5 hip 2011  . Osteoarthritis     Family History  Problem Relation Age of Onset  . Pulmonary embolism Mother   . Depression Mother     major depressive disorder  . Cancer Father     bladder  . Diabetes Brother   . Hyperlipidemia Brother   . Arthritis Brother   . Arthritis Brother     DJD  . Hip fracture Mother     about 35   . Heart attack Neg Hx   . Stroke Maternal Aunt   . Hypertension Mother     Social History   Social History  . Marital Status: Married    Spouse Name: N/A  . Number of Children: N/A  . Years of Education: N/A   Social History Main Topics  . Smoking status: Never Smoker   . Smokeless tobacco: None  . Alcohol Use: Yes  . Drug Use: No  . Sexual Activity:    Partners: Female   Other Topics Concern  . 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 NHL in remission   Regular exercise- yes   HH of 2   No pets    Multiple myeloma and non hogkins lymphoma in husband x 6 years   Regular exercise: walk, swim, some  biking   Caffeine use: coffee daily, sweet tea    Outpatient Prescriptions Prior to Visit  Medication Sig Dispense Refill  . bifidobacterium infantis (ALIGN) capsule Take 1 capsule by mouth daily.    . calcium-vitamin D (OSCAL WITH D) 500-200 MG-UNIT per tablet Take 1 tablet by mouth daily.      . cyclobenzaprine (FLEXERIL) 10 MG tablet Take 5 mg by mouth at bedtime as needed (TMJ).     Marland Kitchen ibuprofen (ADVIL,MOTRIN) 200 MG tablet Take 200-400 mg by mouth 2 (two) times daily as needed (hip and back pain).    . LORazepam (ATIVAN) 0.5 MG tablet TAKE 1 TABLET BY MOUTH EVERY 8 HOURS AS NEEDED FOR ANXIETY 24 tablet 0  . metoprolol succinate (TOPROL XL) 25 MG 24 hr tablet Take 1 tablet (25 mg total) by mouth daily. (Patient taking differently: Take 25 mg by mouth daily as needed (palpitations). ) 30 tablet 6  . Multiple Vitamins-Minerals (ICAPS PO) Take 1 capsule by mouth 2 (two)  times daily.     . naproxen sodium (ANAPROX) 220 MG tablet Take 220 mg by mouth 2 (two) times daily as needed (pain). ALEVE    . polyethylene glycol (MIRALAX / GLYCOLAX) packet Take 8.5 g by mouth daily.    . citalopram (CELEXA) 10 MG tablet Take 1 tablet (10 mg total) by mouth daily. Can increase to 20 mg per day (Patient taking differently: Take 15 mg by mouth daily. Can increase to 20 mg per day) 90 tablet 1  . losartan (COZAAR) 50 MG tablet Take 1 tablet (50 mg total) by mouth daily. 90 tablet 1   No facility-administered medications prior to visit.     EXAM:  BP 126/70 mmHg  Temp(Src) 97.8 F (36.6 C) (Oral)  Ht _0  (1.626 m)  Wt 140 lb 14.4 oz (63.912 kg)  BMI 24.17 kg/m2  Body mass index is 24.17 kg/(m^2).  GENERAL: vitals reviewed and listed above, alert, oriented, appears well hydrated and in no acute distress HEENT: atraumatic, conjunctiva  clear, no obvious abnormalities on inspection of external nose and ears NECK: no obvious masses on inspection palpation  LUNGS: clear to auscultation bilaterally, no  wheezes, rales or rhonchi, good air movement CV: HRRR, no clubbing cyanosis or  peripheral edema nl cap refill  MS: moves all extremities without noticeable focal  abnormality PSYCH: pleasant and cooperative, no obvious depression or anxiety BP Readings from Last 3 Encounters:  06/17/15 126/70  04/09/15 134/72  02/04/15 134/82   Wt Readings from Last 3 Encounters:  06/17/15 140 lb 14.4 oz (63.912 kg)  04/09/15 139 lb 3.2 oz (63.141 kg)  02/04/15 137 lb 14.4 oz (62.551 kg)   Lab Results  Component Value Date   WBC 8.4 01/14/2015   HGB 12.4 01/14/2015   HCT 38.0 01/14/2015   PLT 303.0 01/14/2015   GLUCOSE 74 01/26/2015   CHOL 188 01/14/2015   TRIG 83.0 01/14/2015   HDL 60.20 01/14/2015   LDLDIRECT 137.2 12/14/2010   LDLCALC 111* 01/14/2015   ALT 11 01/09/2014   AST 14 01/09/2014   NA 140 01/26/2015   K 3.8 01/26/2015   CL 104 01/26/2015   CREATININE 0.76 01/26/2015   BUN 17 01/26/2015   CO2 27 01/26/2015   TSH 2.77 01/14/2015   HGBA1C 5.6 01/14/2015    ASSESSMENT AND PLAN:  Discussed the following assessment and plan:  Essential hypertension - good control bmp after the switch - Plan: Basic metabolic panel  Medication management  ACE-inhibitor cough - gone  Adjustment disorder with anxious mood - doing quite well  ok to stay on 15 mg at this time   Check bmp and if all ok yearly check refill med  Contact us for ativan rx when needed   -Patient advised to return or notify health care team  if symptoms worsen ,persist or new concerns arise.  Patient Instructions  Will notify you  of labs when available.  If all ok stay on same med and see you at your yearly  Check up.    Standley Brooking. Erling Arrazola M.D.

## 2015-06-17 ENCOUNTER — Ambulatory Visit (INDEPENDENT_AMBULATORY_CARE_PROVIDER_SITE_OTHER): Payer: Medicare Other | Admitting: Internal Medicine

## 2015-06-17 ENCOUNTER — Encounter: Payer: Self-pay | Admitting: Internal Medicine

## 2015-06-17 VITALS — BP 126/70 | Temp 97.8°F | Ht 64.0 in | Wt 140.9 lb

## 2015-06-17 DIAGNOSIS — F4322 Adjustment disorder with anxiety: Secondary | ICD-10-CM | POA: Diagnosis not present

## 2015-06-17 DIAGNOSIS — I10 Essential (primary) hypertension: Secondary | ICD-10-CM

## 2015-06-17 DIAGNOSIS — R05 Cough: Secondary | ICD-10-CM

## 2015-06-17 DIAGNOSIS — Z79899 Other long term (current) drug therapy: Secondary | ICD-10-CM | POA: Diagnosis not present

## 2015-06-17 DIAGNOSIS — T464X5A Adverse effect of angiotensin-converting-enzyme inhibitors, initial encounter: Secondary | ICD-10-CM

## 2015-06-17 DIAGNOSIS — R058 Other specified cough: Secondary | ICD-10-CM

## 2015-06-17 LAB — BASIC METABOLIC PANEL
BUN: 14 mg/dL (ref 6–23)
CHLORIDE: 103 meq/L (ref 96–112)
CO2: 29 meq/L (ref 19–32)
CREATININE: 0.73 mg/dL (ref 0.40–1.20)
Calcium: 9.3 mg/dL (ref 8.4–10.5)
GFR: 83.7 mL/min (ref >60.00)
Glucose, Bld: 80 mg/dL (ref 70–99)
Potassium: 3.8 mEq/L (ref 3.5–5.1)
Sodium: 139 mEq/L (ref 135–145)

## 2015-06-17 MED ORDER — LOSARTAN POTASSIUM 50 MG PO TABS: 50.0000 mg | ORAL_TABLET | Freq: Every day | ORAL | Status: DC

## 2015-06-17 MED ORDER — CITALOPRAM HYDROBROMIDE 10 MG PO TABS: 15.0000 mg | ORAL_TABLET | Freq: Every day | ORAL | Status: DC

## 2015-06-17 NOTE — Patient Instructions (Signed)
Will notify you  of labs when available.  If all ok stay on same med and see you at your yearly  Check up.

## 2015-06-29 DIAGNOSIS — H6122 Impacted cerumen, left ear: Secondary | ICD-10-CM | POA: Diagnosis not present

## 2015-07-14 ENCOUNTER — Other Ambulatory Visit: Payer: Self-pay | Admitting: Internal Medicine

## 2015-07-15 NOTE — Telephone Encounter (Signed)
Ok to refill x 1  

## 2015-07-15 NOTE — Telephone Encounter (Signed)
Called to the pharmacy and left on machine. 

## 2015-09-14 ENCOUNTER — Other Ambulatory Visit: Payer: Self-pay | Admitting: Internal Medicine

## 2015-09-14 NOTE — Telephone Encounter (Signed)
Ok x 1

## 2015-09-15 NOTE — Telephone Encounter (Signed)
Rx called in 

## 2015-12-24 NOTE — Progress Notes (Signed)
Pre visit review using our clinic review tool, if applicable. No additional management support is needed unless otherwise documented below in the visit note.  Chief Complaint  Patient presents with  . Abdominal Pain    Pt recently went to the beach and came down with diarrhea.  She has cramps and nausea.  Has treated with Pepto Bismol and Imodium.  Also states something is protruding from her vagina.  . Vaginal Problem    HPI: Nicole Wood 70 y.o.  comes in for  2-  Sudden attack of diarrhea anda stomach cramps    15th October .   Hs of frequent  Sx  Formed but a lot   No watery  And had again yesterday immodium  And off and on .  intestinal thing since orig.   gasto and on miralax and probiotic  Daily .  Normalized .   Qd or qod. Stopped miralax when this happened.  Grandson  Ok    No appetite.   Used rice and applesauce.  No vomiting.  Now   Also noted that she felt she had some white stuff pushing out of her vaginal area when she was having burning irritation after diarrheal stools. Not happening today just was alarmed. ROS: See pertinent positives and negatives per HPI. No current fever localized pain blood in stool.  Past Medical History:  Diagnosis Date  . Basal cell cancer    nose  . Cystocele   . Depression    in the past  . Hematuria    idiopathic  . Osteoarthritis   . Osteopenia    dexa 2009, -2.3 osteoporosis -2.5 hip 2011  . Urinary incontinence     Family History  Problem Relation Age of Onset  . Pulmonary embolism Mother   . Depression Mother     major depressive disorder  . Cancer Father     bladder  . Diabetes Brother   . Hyperlipidemia Brother   . Arthritis Brother   . Arthritis Brother     DJD  . Hip fracture Mother     about 30   . Heart attack Neg Hx   . Stroke Maternal Aunt   . Hypertension Mother     Social History   Social History  . Marital status: Married    Spouse name: N/A  . Number of children: N/A  . Years of education: N/A     Social History Main Topics  . Smoking status: Never Smoker  . Smokeless tobacco: None  . Alcohol use Yes  . Drug use: No  . Sexual activity: Yes    Partners: Female   Other Topics Concern  . None   Social History Narrative   Retired Arts development officer) some college   Married 4 children [place] char[place],, Midwife, and [place], and [place] Husband cancer MM. NHL in remission   Regular exercise- yes   HH of 2   No pets    Multiple myeloma and non hogkins lymphoma in husband x 6 years   Regular exercise: walk, swim, some biking   Caffeine use: coffee daily, sweet tea    Outpatient Medications Prior to Visit  Medication Sig Dispense Refill  . bifidobacterium infantis (ALIGN) capsule Take 1 capsule by mouth daily.    . calcium-vitamin D (OSCAL WITH D) 500-200 MG-UNIT per tablet Take 1 tablet by mouth daily.      . citalopram (CELEXA) 10 MG tablet Take 1.5 tablets (15 mg total) by mouth daily. Can increase to 20  mg per day 135 tablet 3  . cyclobenzaprine (FLEXERIL) 10 MG tablet Take 5 mg by mouth at bedtime as needed (TMJ).     Marland Kitchen ibuprofen (ADVIL,MOTRIN) 200 MG tablet Take 200-400 mg by mouth 2 (two) times daily as needed (hip and back pain).    . LORazepam (ATIVAN) 0.5 MG tablet TAKE 1 TABLET BY MOUTH EVERY 8 HOURS AS NEEDED FOR ANXIETY 24 tablet 1  . losartan (COZAAR) 50 MG tablet Take 1 tablet (50 mg total) by mouth daily. 90 tablet 1  . Multiple Vitamins-Minerals (ICAPS PO) Take 1 capsule by mouth 2 (two) times daily.     . naproxen sodium (ANAPROX) 220 MG tablet Take 220 mg by mouth 2 (two) times daily as needed (pain). ALEVE    . polyethylene glycol (MIRALAX / GLYCOLAX) packet Take 8.5 g by mouth daily.    . metoprolol succinate (TOPROL XL) 25 MG 24 hr tablet Take 1 tablet (25 mg total) by mouth daily. (Patient not taking: Reported on 12/25/2015) 30 tablet 6   No facility-administered medications prior to visit.      EXAM:  BP 134/80 (BP Location: Right Arm, Patient Position: Sitting,  Cuff Size: Normal)   Temp 97.7 F (36.5 C) (Oral)   Wt 137 lb 3.2 oz (62.2 kg)   BMI 23.55 kg/m   Body mass index is 23.55 kg/m.  GENERAL: vitals reviewed and listed above, alert, oriented, appears well hydrated and in no acute distress HEENT: atraumatic, conjunctiva  clear, no obvious abnormalities on inspection of external nose and ears  NECK: no obvious masses on inspection palpation  CV: HRRR, no clubbing cyanosis or  peripheral edema nl cap refill  Abdomen:  Sof,t normal bowel sounds without hepatosplenomegaly, no guarding rebound or masses no CVA tenderness mildly tender in the lower quadrants right more than left but no masses are felt. No rebound no psoas sign.  External GU looks normal no prolapsing vaginal mucosa looks pink there are some yellowish whitish looking areas in the posterior vaginal wall but no specific lesion. No foreign body seen  MS: moves all extremities without noticeable focal  abnormality PSYCH: pleasant and cooperative, no obvious depression or anxiety Lab Results  Component Value Date   WBC 5.4 12/25/2015   HGB 12.1 12/25/2015   HCT 35.5 (L) 12/25/2015   PLT 311.0 12/25/2015   GLUCOSE 89 12/25/2015   CHOL 188 01/14/2015   TRIG 83.0 01/14/2015   HDL 60.20 01/14/2015   LDLDIRECT 137.2 12/14/2010   LDLCALC 111 (H) 01/14/2015   ALT 12 12/25/2015   AST 13 12/25/2015   NA 140 12/25/2015   K 4.6 12/25/2015   CL 103 12/25/2015   CREATININE 0.91 12/25/2015   BUN 14 12/25/2015   CO2 31 12/25/2015   TSH 2.77 01/14/2015   HGBA1C 5.6 01/14/2015    ASSESSMENT AND PLAN:  Discussed the following assessment and plan:  Abdominal cramping - Plan: CBC with Differential/Platelet, CMP, POCT Urinalysis Dipstick (Automated), Culture, Urine  Diarrhea, unspecified type - Plan: CBC with Differential/Platelet, CMP, POCT Urinalysis Dipstick (Automated)  Need for prophylactic vaccination and inoculation against influenza - Plan: Flu vaccine HIGH DOSE PF (Fluzone  High dose) Uncertain cause exam appears pretty benign and not alarming. Lab work urinalysis today plan follow-up if persistent or as appropriate. It is possible she could have mild diverticulitis but is not impressive for this we'll get a white count today stay on her probiotic if not continuing to improve consider further evaluation. I want her  to stop the Imodium or any bowel meds except for the probiotic. Uncertain what she is on her vaginal area but it may have been a prolapsing pelvic organ. -Patient advised to return or notify health care team  if symptoms worsen ,persist or new concerns arise. See below  Total visit 31mns > 50% spent counseling and coordinating care as indicated in above note and in instructions to patient .   Patient Instructions  Sounds like a stomach bug and residual from this.  howver consider other causes.  Will notify you  of labs when available.  Stop the immodium  Sty on the  Probiotic  If get fever or worse seek care   Consider an atypical diverticulitis cause  Also but  Not typical of this .   Vaginal exam is normal ? If had a prolapsing sx from the diarrhea  Cause  If continued concern we can have you see GYNE for  Exam .       WStandley Brooking Eulises Kijowski M.D.

## 2015-12-25 ENCOUNTER — Encounter: Payer: Self-pay | Admitting: Internal Medicine

## 2015-12-25 ENCOUNTER — Ambulatory Visit (INDEPENDENT_AMBULATORY_CARE_PROVIDER_SITE_OTHER): Payer: Medicare Other | Admitting: Internal Medicine

## 2015-12-25 VITALS — BP 134/80 | Temp 97.7°F | Wt 137.2 lb

## 2015-12-25 DIAGNOSIS — R109 Unspecified abdominal pain: Secondary | ICD-10-CM

## 2015-12-25 DIAGNOSIS — Z23 Encounter for immunization: Secondary | ICD-10-CM

## 2015-12-25 DIAGNOSIS — R197 Diarrhea, unspecified: Secondary | ICD-10-CM

## 2015-12-25 LAB — COMPREHENSIVE METABOLIC PANEL WITH GFR
ALT: 12 U/L (ref 0–35)
AST: 13 U/L (ref 0–37)
Albumin: 4.4 g/dL (ref 3.5–5.2)
Alkaline Phosphatase: 76 U/L (ref 39–117)
BUN: 14 mg/dL (ref 6–23)
CO2: 31 meq/L (ref 19–32)
Calcium: 9.7 mg/dL (ref 8.4–10.5)
Chloride: 103 meq/L (ref 96–112)
Creatinine, Ser: 0.91 mg/dL (ref 0.40–1.20)
GFR: 64.81 mL/min
Glucose, Bld: 89 mg/dL (ref 70–99)
Potassium: 4.6 meq/L (ref 3.5–5.1)
Sodium: 140 meq/L (ref 135–145)
Total Bilirubin: 0.4 mg/dL (ref 0.2–1.2)
Total Protein: 6.6 g/dL (ref 6.0–8.3)

## 2015-12-25 LAB — CBC WITH DIFFERENTIAL/PLATELET
Basophils Absolute: 0 K/uL (ref 0.0–0.1)
Basophils Relative: 0.5 % (ref 0.0–3.0)
Eosinophils Absolute: 0.1 K/uL (ref 0.0–0.7)
Eosinophils Relative: 2.3 % (ref 0.0–5.0)
HCT: 35.5 % — ABNORMAL LOW (ref 36.0–46.0)
Hemoglobin: 12.1 g/dL (ref 12.0–15.0)
Lymphocytes Relative: 31 % (ref 12.0–46.0)
Lymphs Abs: 1.7 K/uL (ref 0.7–4.0)
MCHC: 34.3 g/dL (ref 30.0–36.0)
MCV: 90.3 fl (ref 78.0–100.0)
Monocytes Absolute: 0.6 K/uL (ref 0.1–1.0)
Monocytes Relative: 10.9 % (ref 3.0–12.0)
Neutro Abs: 3 K/uL (ref 1.4–7.7)
Neutrophils Relative %: 55.3 % (ref 43.0–77.0)
Platelets: 311 K/uL (ref 150.0–400.0)
RBC: 3.93 Mil/uL (ref 3.87–5.11)
RDW: 13.4 % (ref 11.5–15.5)
WBC: 5.4 K/uL (ref 4.0–10.5)

## 2015-12-25 LAB — POC URINALSYSI DIPSTICK (AUTOMATED)
Bilirubin, UA: NEGATIVE
Glucose, UA: NEGATIVE
Ketones, UA: NEGATIVE
Nitrite, UA: NEGATIVE
Spec Grav, UA: 1.015
Urobilinogen, UA: 0.2
pH, UA: 6

## 2015-12-25 NOTE — Patient Instructions (Addendum)
Sounds like a stomach bug and residual from this.  howver consider other causes.  Will notify you  of labs when available.  Stop the immodium  Sty on the  Probiotic  If get fever or worse seek care   Consider an atypical diverticulitis cause  Also but  Not typical of this .   Vaginal exam is normal ? If had a prolapsing sx from the diarrhea  Cause  If continued concern we can have you see GYNE for  Exam .

## 2015-12-27 LAB — URINE CULTURE: Organism ID, Bacteria: NO GROWTH

## 2016-01-15 ENCOUNTER — Other Ambulatory Visit: Payer: Self-pay | Admitting: Internal Medicine

## 2016-01-15 NOTE — Telephone Encounter (Signed)
Losartan sent to the pharmacy for 6 months by e-scribe.  Lorazepam called in and left on machine.

## 2016-01-15 NOTE — Telephone Encounter (Signed)
Losartan refill x 6 months  Lorazepam x 1

## 2016-02-18 ENCOUNTER — Other Ambulatory Visit: Payer: Self-pay | Admitting: Internal Medicine

## 2016-02-19 NOTE — Telephone Encounter (Signed)
Okay to refill? 

## 2016-02-19 NOTE — Telephone Encounter (Signed)
Ok to refill x 1  

## 2016-02-23 NOTE — Progress Notes (Signed)
Pre visit review using our clinic review tool, if applicable. No additional management support is needed unless otherwise documented below in the visit note.  Chief Complaint  Patient presents with  . Medicare Wellness    HPI: Nicole Wood 70 y.o. comes in today for Preventive Medicare wellness visit . And Chronic disease management Since last visit. celexa   1.5   Now   Helping Her anxiety and thinks that she might even do better on  20  Using  Ativan    ocass  1/2 in evening .    Rare.  Still has a minor cough like a drainage coughing the morning on losartan. Pressure seems to be controlled.  Health Maintenance  Topic Date Due  . Hepatitis C Screening  06-Sep-1945  . MAMMOGRAM  03/30/2017  . TETANUS/TDAP  02/28/2018  . COLONOSCOPY  12/02/2019  . INFLUENZA VACCINE  Completed  . DEXA SCAN  Completed  . ZOSTAVAX  Completed  . PNA vac Low Risk Adult  Completed   Health Maintenance Review LIFESTYLE:  TAD etoh  Per week   Less than one  Sugar beverages:  Daily limit .  Sleep: 7-8  hh of 2 no pets   Family in [place]     Hearing: ok  Vision:  No limitations at present . Last eye check UTD  Safety:  Has smoke detector and wears seat belts.  No firearms. No excess sun exposure. Sees dentist regularly.  Falls: n  Advance directive :  Reviewed  Not yet has forms aware.  Memory: Felt to be good  , no concern from her or her family.  Depression: No anhedonia unusual crying or depressive symptoms  Nutrition: Eats well balanced diet; adequate calcium and vitamin D. No swallowing chewing problems.  Injury: no major injuries in the last six months.  Other healthcare providers:  Reviewed today .  Social:  Lives with spouse married. No pets.   Preventive parameters: up-to-date  Reviewed   ADLS:   There are no problems or need for assistance  driving, feeding, obtaining food, dressing, toileting and bathing, managing money using phone.  She is independent.    ROS:  GEN/ HEENT: No fever, significant weight changes sweats headaches vision problems hearing changes, CV/ PULM; No chest pain shortness of breath cough, syncope,edema  change in exercise tolerance. GI /GU: No adominal pain, vomiting, change in bowel habits. No blood in the stool. No significant GU symptoms. SKIN/HEME: ,no acute skin rashes suspicious lesions or bleeding. No lymphadenopathy, nodules, masses.  NEURO/ PSYCH:  No neurologic signs such as weakness numbness. No depression anxiety. IMM/ Allergy: No unusual infections.  Allergy .   REST of 12 system review negative except as per HPI   Past Medical History:  Diagnosis Date  . Basal cell cancer    nose  . Cystocele   . Depression    in the past  . Hematuria    idiopathic  . Osteoarthritis   . Osteopenia    dexa 2009, -2.3 osteoporosis -2.5 hip 2011  . Urinary incontinence     Family History  Problem Relation Age of Onset  . Pulmonary embolism Mother   . Depression Mother     major depressive disorder  . Cancer Father     bladder  . Diabetes Brother   . Hyperlipidemia Brother   . Arthritis Brother   . Arthritis Brother     DJD  . Hip fracture Mother  about 71   . Heart attack Neg Hx   . Stroke Maternal Aunt   . Hypertension Mother     Social History   Social History  . Marital status: Married    Spouse name: N/A  . Number of children: N/A  . Years of education: N/A   Social History Main Topics  . Smoking status: Never Smoker  . Smokeless tobacco: None  . Alcohol use Yes  . Drug use: No  . Sexual activity: Yes    Partners: Female   Other Topics Concern  . 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 NHL in remission   Regular exercise- yes   HH of 2   No pets    Multiple myeloma and non hogkins lymphoma in husband x 6 years   Regular exercise: walk, swim,  some biking   Caffeine use: coffee daily, sweet tea    Outpatient Encounter Prescriptions as of 02/24/2016  Medication Sig  . bifidobacterium infantis (ALIGN) capsule Take 1 capsule by mouth daily.  . calcium-vitamin D (OSCAL WITH D) 500-200 MG-UNIT per tablet Take 1 tablet by mouth daily.    . citalopram (CELEXA) 10 MG tablet Take 2 tablets (20 mg total) by mouth daily.  . cyclobenzaprine (FLEXERIL) 10 MG tablet Take 5 mg by mouth at bedtime as needed (TMJ).   Marland Kitchen ibuprofen (ADVIL,MOTRIN) 200 MG tablet Take 200-400 mg by mouth 2 (two) times daily as needed (hip and back pain).  . LORazepam (ATIVAN) 0.5 MG tablet TAKE 1 TABLET BY MOUTH EVERY 8 HOURS AS NEEDED FOR ANXIETY  . losartan (COZAAR) 50 MG tablet TAKE 1 TABLET(50 MG) BY MOUTH DAILY  . metoprolol succinate (TOPROL XL) 25 MG 24 hr tablet Take 1 tablet (25 mg total) by mouth daily.  . Multiple Vitamins-Minerals (ICAPS PO) Take 1 capsule by mouth 2 (two) times daily.   . naproxen sodium (ANAPROX) 220 MG tablet Take 220 mg by mouth 2 (two) times daily as needed (pain). ALEVE  . polyethylene glycol (MIRALAX / GLYCOLAX) packet Take 8.5 g by mouth daily.  . [DISCONTINUED] citalopram (CELEXA) 10 MG tablet Take 1.5 tablets (15 mg total) by mouth daily. Can increase to 20 mg per day   No facility-administered encounter medications on file as of 02/24/2016.     EXAM:  BP 134/80 (BP Location: Left Arm, Patient Position: Sitting, Cuff Size: Normal)   Temp 97.5 F (36.4 C) (Oral)   Ht '5\' 4"'  (1.626 m)   Wt 140 lb 6.4 oz (63.7 kg)   BMI 24.10 kg/m   Body mass index is 24.1 kg/m.  Physical Exam: Vital signs reviewed ACZ:YSAY is a well-developed well-nourished alert cooperative   who appears stated age in no acute distress.  HEENT: normocephalic atraumatic , Eyes: PERRL EOM's full, conjunctiva clear, Nares: paten,t no deformity discharge or tenderness., Ears: no deformity EAC's clear TMs with normal landmarks. Mouth: clear OP, no lesions,  edema.  Moist mucous membranes. Dentition in adequate repair. NECK: supple without masses, thyromegaly or bruits. CHEST/PULM:  Clear to auscultation and percussion breath sounds equal no wheeze , rales or rhonchi. No chest wall deformities or tenderness.Breast: normal by inspection . No dimpling, discharge, masses, tenderness or discharge .CV: PMI is nondisplaced, S1 S2 no gallops, murmurs, rubs. Peripheral pulses are full without delay.No JVD .  ABDOMEN: Bowel sounds normal nontender  No guard or rebound, no  hepato splenomegal no CVA tenderness.   Extremtities:  No clubbing cyanosis or edema, no acute joint swelling or redness no focal atrophy NEURO:  Oriented x3, cranial nerves 3-12 appear to be intact, no obvious focal weakness,gait within normal limits no abnormal reflexes or asymmetrical SKIN: No acute rashes normal turgor, color, no bruising or petechiae. PSYCH: Oriented, good eye contact, no obvious depression anxiety, cognition and judgment appear normal. LN: no cervical axillary inguinal adenopathy No noted deficits in memory, attention, and speech.   Lab Results  Component Value Date   WBC 6.8 02/24/2016   HGB 12.2 02/24/2016   HCT 36.1 02/24/2016   PLT 293.0 02/24/2016   GLUCOSE 89 12/25/2015   CHOL 188 01/14/2015   TRIG 83.0 01/14/2015   HDL 60.20 01/14/2015   LDLDIRECT 137.2 12/14/2010   LDLCALC 111 (H) 01/14/2015   ALT 12 12/25/2015   AST 13 12/25/2015   NA 140 12/25/2015   K 4.6 12/25/2015   CL 103 12/25/2015   CREATININE 0.91 12/25/2015   BUN 14 12/25/2015   CO2 31 12/25/2015   TSH 2.77 01/14/2015   HGBA1C 5.6 01/14/2015    BP Readings from Last 3 Encounters:  02/24/16 134/80  12/25/15 134/80  06/17/15 126/70   Wt Readings from Last 3 Encounters:  02/24/16 140 lb 6.4 oz (63.7 kg)  12/25/15 137 lb 3.2 oz (62.2 kg)  06/17/15 140 lb 14.4 oz (63.9 kg)    ASSESSMENT AND PLAN:  Discussed the following assessment and plan:  Medicare annual wellness visit,  subsequent  Medication management - ok to increase ot 20 mg  per day - Plan: CBC with Differential/Platelet, Hepatitis B surface antigen, Hepatitis B surface antibody, Hepatitis C antibody, HIV antibody  Adjustment disorder with mixed anxiety and depressed mood  Anxiety state  Essential hypertension - controlled  drainage cough consdier change arb ifneeded - Plan: CBC with Differential/Platelet, Hepatitis B surface antigen, Hepatitis B surface antibody, Hepatitis C antibody, HIV antibody  Elevated cholesterol - lsi  History of blood transfusion - Plan: CBC with Differential/Platelet, Hepatitis B surface antigen, Hepatitis B surface antibody, Hepatitis C antibody, HIV antibody  Other problems related to lifestyle - low risk except hx of transfusion - Plan: CBC with Differential/Platelet, Hepatitis B surface antigen, Hepatitis B surface antibody, Hepatitis C antibody, HIV antibody  Patient Care Team: Burnis Medin, MD as PCP - General Ninetta Lights, MD (Orthopedic Surgery) Rolm Bookbinder, MD (Dermatology) Frederik Schmidt, MD as Attending Physician (Oral Surgery)  Patient Instructions   Continue lifestyle intervention healthy eating and exercise . bp is good today . Can make  Next year wellness visit with Wynetta Fines our health coach and  physical and med labs appointments  with me. Ok to increase  Citalopram  To 20 mg and if ok then contact for  New rx ofr 20 mg pills refills.   Screening for hep c today and hep b because of  Hx of   Transfusion .   If all ok then rov in 6 months med check      Standley Brooking. Shereta Crothers M.D.

## 2016-02-24 ENCOUNTER — Ambulatory Visit (INDEPENDENT_AMBULATORY_CARE_PROVIDER_SITE_OTHER): Payer: Medicare Other | Admitting: Internal Medicine

## 2016-02-24 ENCOUNTER — Encounter: Payer: Self-pay | Admitting: Internal Medicine

## 2016-02-24 ENCOUNTER — Other Ambulatory Visit: Payer: Self-pay | Admitting: Internal Medicine

## 2016-02-24 VITALS — BP 134/80 | Temp 97.5°F | Ht 64.0 in | Wt 140.4 lb

## 2016-02-24 DIAGNOSIS — F4323 Adjustment disorder with mixed anxiety and depressed mood: Secondary | ICD-10-CM | POA: Diagnosis not present

## 2016-02-24 DIAGNOSIS — Z1231 Encounter for screening mammogram for malignant neoplasm of breast: Secondary | ICD-10-CM

## 2016-02-24 DIAGNOSIS — I10 Essential (primary) hypertension: Secondary | ICD-10-CM | POA: Diagnosis not present

## 2016-02-24 DIAGNOSIS — Z9289 Personal history of other medical treatment: Secondary | ICD-10-CM

## 2016-02-24 DIAGNOSIS — Z0000 Encounter for general adult medical examination without abnormal findings: Secondary | ICD-10-CM | POA: Diagnosis not present

## 2016-02-24 DIAGNOSIS — Z7289 Other problems related to lifestyle: Secondary | ICD-10-CM | POA: Diagnosis not present

## 2016-02-24 DIAGNOSIS — E7800 Pure hypercholesterolemia, unspecified: Secondary | ICD-10-CM

## 2016-02-24 DIAGNOSIS — F411 Generalized anxiety disorder: Secondary | ICD-10-CM

## 2016-02-24 DIAGNOSIS — Z79899 Other long term (current) drug therapy: Secondary | ICD-10-CM

## 2016-02-24 LAB — CBC WITH DIFFERENTIAL/PLATELET
BASOS PCT: 0.8 % (ref 0.0–3.0)
Basophils Absolute: 0.1 10*3/uL (ref 0.0–0.1)
EOS ABS: 0.2 10*3/uL (ref 0.0–0.7)
Eosinophils Relative: 3.3 % (ref 0.0–5.0)
HEMATOCRIT: 36.1 % (ref 36.0–46.0)
HEMOGLOBIN: 12.2 g/dL (ref 12.0–15.0)
LYMPHS PCT: 26.9 % (ref 12.0–46.0)
Lymphs Abs: 1.8 10*3/uL (ref 0.7–4.0)
MCHC: 33.6 g/dL (ref 30.0–36.0)
MCV: 91.8 fl (ref 78.0–100.0)
Monocytes Absolute: 0.6 10*3/uL (ref 0.1–1.0)
Monocytes Relative: 8.3 % (ref 3.0–12.0)
Neutro Abs: 4.1 10*3/uL (ref 1.4–7.7)
Neutrophils Relative %: 60.7 % (ref 43.0–77.0)
Platelets: 293 10*3/uL (ref 150.0–400.0)
RBC: 3.94 Mil/uL (ref 3.87–5.11)
RDW: 14.2 % (ref 11.5–15.5)
WBC: 6.8 10*3/uL (ref 4.0–10.5)

## 2016-02-24 MED ORDER — CITALOPRAM HYDROBROMIDE 10 MG PO TABS: 20.0000 mg | ORAL_TABLET | Freq: Every day | ORAL | 3 refills | Status: DC

## 2016-02-24 NOTE — Patient Instructions (Addendum)
  Continue lifestyle intervention healthy eating and exercise . bp is good today . Can make  Next year wellness visit with Wynetta Fines our health coach and  physical and med labs appointments  with me. Ok to increase  Citalopram  To 20 mg and if ok then contact for  New rx ofr 20 mg pills refills.   Screening for hep c today and hep b because of  Hx of   Transfusion .   If all ok then rov in 6 months med check

## 2016-02-25 ENCOUNTER — Telehealth: Payer: Self-pay | Admitting: Internal Medicine

## 2016-02-25 LAB — HEPATITIS B SURFACE ANTIGEN: Hepatitis B Surface Ag: NEGATIVE

## 2016-02-25 LAB — HIV ANTIBODY (ROUTINE TESTING W REFLEX): HIV: NONREACTIVE

## 2016-02-25 LAB — HEPATITIS C ANTIBODY: HCV Ab: NEGATIVE

## 2016-02-25 LAB — HEPATITIS B SURFACE ANTIBODY,QUALITATIVE: Hep B S Ab: NEGATIVE

## 2016-02-25 NOTE — Telephone Encounter (Signed)
Pt calling not sure if someone was calling about her lab results.  State that she saw that they are negative on MyChart and really appreciate that someone called her.

## 2016-02-25 NOTE — Telephone Encounter (Signed)
Noted  

## 2016-03-22 DIAGNOSIS — H6122 Impacted cerumen, left ear: Secondary | ICD-10-CM | POA: Diagnosis not present

## 2016-03-22 NOTE — Progress Notes (Signed)
Pre visit review using our clinic review tool, if applicable. No additional management support is needed unless otherwise documented below in the visit note.  Chief Complaint  Patient presents with  . Follow-up    Pt having anxiety attacks    HPI: Nicole Wood 71 y.o.  Last seen December     Inc to 20 mg celexa For general anxiety. However since that time she's had a couple of episodes which she calls panic attacks become more frequent.  Had  Episode jan 10   In am pre dentist   Had rapid heart rate and calmed down in 5-6 minutes and took rescue Ativan.  another jan 19th     Took ativan  And rest of day fine and then     Middle of noght   245 am aWoke up and  Heart was racing a lot .    bp was  160/100 pulse 137 .  And ems called . More nervous cause high and rate 130  Ativan .  X 1  To 150 range . ? Anxiety attacks .   No ekg strip.   . Fine in between  But  Jan 23 am    A  little racy .    No cp sob   burning feeling in truynk.   She saw cardiology Dr. scales in 2016 and was given Lopressor to use as needed but she never has tried that. No current chest pain shortness of breath feels pretty good in between. She did go down to 15 mg of the citalopram in case that was contributing.  ROS: See pertinent positives and negatives per HPI.  Past Medical History:  Diagnosis Date  . Basal cell cancer    nose  . Cystocele   . Depression    in the past  . Hematuria    idiopathic  . Osteoarthritis   . Osteopenia    dexa 2009, -2.3 osteoporosis -2.5 hip 2011  . Urinary incontinence     Family History  Problem Relation Age of Onset  . Pulmonary embolism Mother   . Depression Mother     major depressive disorder  . Cancer Father     bladder  . Diabetes Brother   . Hyperlipidemia Brother   . Arthritis Brother   . Arthritis Brother     DJD  . Hip fracture Mother     about 38   . Heart attack Neg Hx   . Stroke Maternal Aunt   . Hypertension Mother     Social History    Social History  . Marital status: Married    Spouse name: N/A  . Number of children: N/A  . Years of education: N/A   Social History Main Topics  . Smoking status: Never Smoker  . Smokeless tobacco: Never Used  . Alcohol use Yes  . Drug use: No  . Sexual activity: Yes    Partners: Female   Other Topics Concern  . None   Social History Narrative   Retired Arts development officer) some college   Married 4 children [place] char[place],, Midwife, and [place], and [place] Husband cancer MM. NHL in remission   Regular exercise- yes   HH of 2   No pets    Multiple myeloma and non hogkins lymphoma in husband x 6 years   Regular exercise: walk, swim, some biking   Caffeine use: coffee daily, sweet tea    Outpatient Medications Prior to Visit  Medication Sig Dispense Refill  .  bifidobacterium infantis (ALIGN) capsule Take 1 capsule by mouth daily.    . calcium-vitamin D (OSCAL WITH D) 500-200 MG-UNIT per tablet Take 1 tablet by mouth daily.      . citalopram (CELEXA) 10 MG tablet Take 2 tablets (20 mg total) by mouth daily. 180 tablet 3  . cyclobenzaprine (FLEXERIL) 10 MG tablet Take 5 mg by mouth at bedtime as needed (TMJ).     Marland Kitchen ibuprofen (ADVIL,MOTRIN) 200 MG tablet Take 200-400 mg by mouth 2 (two) times daily as needed (hip and back pain).    Marland Kitchen losartan (COZAAR) 50 MG tablet TAKE 1 TABLET(50 MG) BY MOUTH DAILY 90 tablet 1  . Multiple Vitamins-Minerals (ICAPS PO) Take 1 capsule by mouth 2 (two) times daily.     . naproxen sodium (ANAPROX) 220 MG tablet Take 220 mg by mouth 2 (two) times daily as needed (pain). ALEVE    . polyethylene glycol (MIRALAX / GLYCOLAX) packet Take 8.5 g by mouth daily.    Marland Kitchen LORazepam (ATIVAN) 0.5 MG tablet TAKE 1 TABLET BY MOUTH EVERY 8 HOURS AS NEEDED FOR ANXIETY 24 tablet 1  . metoprolol succinate (TOPROL XL) 25 MG 24 hr tablet Take 1 tablet (25 mg total) by mouth daily. (Patient not taking: Reported on 03/23/2016) 30 tablet 6   No facility-administered medications prior to  visit.      EXAM:  BP 134/70 (BP Location: Left Arm, Patient Position: Sitting, Cuff Size: Normal)   Temp 97.5 F (36.4 C) (Oral)   Wt 142 lb (64.4 kg)   BMI 24.37 kg/m   Body mass index is 24.37 kg/m.  GENERAL: vitals reviewed and listed above, alert, oriented, appears well hydrated and in no acute distress HEENT: atraumatic, conjunctiva  clear, no obvious abnormalities on inspection of external nose and earsNECK: no obvious masses on inspection palpation  LUNGS: clear to auscultation bilaterally, no wheezes, rales or rhonchi, good air movement CV: HRRR, no clubbing cyanosis or  peripheral edema nl cap refill  MS: moves all extremities without noticeable focal  abnormality PSYCH: pleasant and cooperative, no obvious depression or anxiety  EKG nsr no acute findings and  Nl qt interval noted . Copy to patient   ASSESSMENT AND PLAN:  Discussed the following assessment and plan:  Palpitations - Plan: EKG 12-Lead  Tachycardia episode  Medication management  Anxiety state She is having what sounds like tachycardic episodes. Possibly from anxiety and panic however the nocturnal episode with a rate of 137 not captured is not typical. We discussed possibility of electrical issues with the heart ,?if  20 mg of citalopram would set her up for arrhythmia.  today ekg nl.   Stay at the 15 mg a citalopram call and get appointment with cardiology and I will send him a copy of our note.  The question is whether further evaluation or event monitor to check heart rate would be in order. She has never tried the Lopressor as needed although that's potentially appropriate. She should discuss this with cardiology also. reviewed how  b blockers work .  -Patient advised to return or notify health care team  if symptoms worsen ,persist or new concerns arise.  Patient Instructions  You ekg is good . Stay on the lower dose of the celexa 15 mg  Ok to iuse the ativan as you are doing . Would like  you to see cardiology again to see if we should get more information  Such as a  hearrt monitor to ensure  No  other interventions  Like the lopressor should be added .         Standley Brooking. Panosh M.D.  1. Palpitations-Toprol-XL 25 mg once a day when necessary. If increases frequency and intensity, we will have low threshold for an event monitor. Discussed the potential for atrial fibrillation. Hopefully, she is only having premature ventricular contractions with associated anxiety causing sinus tachycardia as was seen in the emergency department. Ativan is reasonable as well as. No heart murmurs, no syncope, no anginal symptoms. No signs of heart failure. We will continue to monitor clinically. Electrolytes and TSH are normal.  2.  I will see back in one year  Signed, Candee Furbish, MD [facility]  04/01/2014 3:30 PM

## 2016-03-23 ENCOUNTER — Encounter: Payer: Self-pay | Admitting: Internal Medicine

## 2016-03-23 ENCOUNTER — Ambulatory Visit (INDEPENDENT_AMBULATORY_CARE_PROVIDER_SITE_OTHER): Payer: Medicare Other | Admitting: Internal Medicine

## 2016-03-23 VITALS — BP 134/70 | Temp 97.5°F | Wt 142.0 lb

## 2016-03-23 DIAGNOSIS — F411 Generalized anxiety disorder: Secondary | ICD-10-CM | POA: Diagnosis not present

## 2016-03-23 DIAGNOSIS — R002 Palpitations: Secondary | ICD-10-CM

## 2016-03-23 DIAGNOSIS — R000 Tachycardia, unspecified: Secondary | ICD-10-CM | POA: Diagnosis not present

## 2016-03-23 DIAGNOSIS — Z79899 Other long term (current) drug therapy: Secondary | ICD-10-CM | POA: Diagnosis not present

## 2016-03-23 MED ORDER — LORAZEPAM 0.5 MG PO TABS: 0.5000 mg | ORAL_TABLET | Freq: Three times a day (TID) | ORAL | 1 refills | Status: DC | PRN

## 2016-03-23 NOTE — Patient Instructions (Signed)
You ekg is good . Stay on the lower dose of the celexa 15 mg  Ok to iuse the ativan as you are doing . Would like you to see cardiology again to see if we should get more information  Such as a  hearrt monitor to ensure  No other interventions  Like the lopressor should be added .

## 2016-03-31 ENCOUNTER — Ambulatory Visit
Admission: RE | Admit: 2016-03-31 | Discharge: 2016-03-31 | Disposition: A | Discharge status: Home / Self Care | Payer: Medicare Other | Source: Ambulatory Visit | Attending: Internal Medicine | Admitting: Internal Medicine

## 2016-03-31 DIAGNOSIS — Z1231 Encounter for screening mammogram for malignant neoplasm of breast: Secondary | ICD-10-CM | POA: Diagnosis not present

## 2016-04-07 ENCOUNTER — Ambulatory Visit (INDEPENDENT_AMBULATORY_CARE_PROVIDER_SITE_OTHER): Payer: Medicare Other | Admitting: Cardiology

## 2016-04-07 ENCOUNTER — Encounter: Payer: Self-pay | Admitting: Cardiology

## 2016-04-07 VITALS — BP 150/90 | HR 90 | Ht 64.0 in | Wt 140.1 lb

## 2016-04-07 DIAGNOSIS — R002 Palpitations: Secondary | ICD-10-CM | POA: Diagnosis not present

## 2016-04-07 DIAGNOSIS — F419 Anxiety disorder, unspecified: Secondary | ICD-10-CM

## 2016-04-07 MED ORDER — METOPROLOL SUCCINATE ER 25 MG PO TB24: 25.0000 mg | ORAL_TABLET | Freq: Two times a day (BID) | ORAL | 3 refills | Status: DC | PRN

## 2016-04-07 NOTE — Patient Instructions (Signed)
Medication Instructions:  The current medical regimen is effective;  continue present plan and medications.  Testing/Procedures: Your physician has recommended that you wear an event monitor. Event monitors are medical devices that record the heart's electrical activity. Doctors most often [place] these monitors to diagnose arrhythmias. Arrhythmias are problems with the speed or rhythm of the heartbeat. The monitor is a small, portable device. You can wear one while you do your normal daily activities. This is usually used to diagnose what is causing palpitations/syncope (passing out).  Follow-Up: Further follow up will be based on the results of your monitor.  If you need a refill on your cardiac medications before your next appointment, please call your pharmacy.  Thank you for choosing [facility]!!

## 2016-04-07 NOTE — Progress Notes (Signed)
[facility]. [address] [place], [place]  [address] Phone: [phone] Fax:  [phone]  Date:  04/07/2016   ID:  Nicole Wood, DOB [birth year], MRN [id number]  PCP:  Lottie Dawson, MD   History of Present Illness: Nicole Wood is a 71 y.o. female here for evaluation of elevated heart rate at the request of Dr. Regis Bill.. Has history of palpitations. Woke up with HR 137 BPM. ?Event monitor.  He describes it as "jumpy heart". She was seen in the emergency department. Labile blood pressure. Has been under increased stress over summer 2015  In Feb 2015, Husband and she went to [facility] for 3 weeks. Had 2 episodes of palpitaions. Mon, Fri. Each time ate grouper. First time while at hotel before sleep, relaxing, felt pounding heart. Feels like she has to get up and walk around. Lasted 3-5 min. Took one of husband's Xanax (On CHEMO). Did fine all week.  On Friday, evening, 7pm went to dinner, grouper sandwich. Glass of wine prior to leaving. Came home, felt very miserable and full. Got in bed, took TUMS, bloated. No Xanax that time. Breathing was fine. At 2am heart bolted her out bed. Walked around, took deep breath, no CP, no diaphoesis. Did not wake Ruthann Cancer her husband up right away. Took Xanax. Scared. Calmed down. Finally went to sleep. Anxious.   Left Saturday went to [place]. Her husband wanted her to go to ER. 2 hr drive. Checked into hotel, heart started racing. Went to ER. HR was 133bpm. Tele. Looked good. Blood work and CXR OK. Grandson mental break.   ER MD talked about toxins in fish.   04/01/14-follow-up visit-she's been doing very well without palpitations. She is eager to start exercising once again. No chest pain, no shortness of breath. She has metoprolol to be taken on as-needed basis.  04/07/16 - woke up flush in the chest, heart racing. EMS called 137. Doing it early in AM. 100 bpm Ativan helps. No syncope, no bleeding, no fevers, no orthopnea.   Wt Readings from Last  3 Encounters:  04/07/16 140 lb 1.9 oz (63.6 kg)  03/23/16 142 lb (64.4 kg)  02/24/16 140 lb 6.4 oz (63.7 kg)     Past Medical History:  Diagnosis Date  . Basal cell cancer    nose  . Cystocele   . Depression    in the past  . Hematuria    idiopathic  . Osteoarthritis   . Osteopenia    dexa 2009, -2.3 osteoporosis -2.5 hip 2011  . Urinary incontinence     Past Surgical History:  Procedure Laterality Date  . ABDOMINAL HYSTERECTOMY     fibroids and cyst right ovary left oopherectomy  . basal cell ca removed    . bladder tack    . rt shoulder surgery   2004   [place]  . URETHRAL DILATION      Current Outpatient Prescriptions  Medication Sig Dispense Refill  . bifidobacterium infantis (ALIGN) capsule Take 1 capsule by mouth daily.    . calcium-vitamin D (OSCAL WITH D) 500-200 MG-UNIT per tablet Take 1 tablet by mouth daily.      . citalopram (CELEXA) 10 MG tablet Take 2 tablets (20 mg total) by mouth daily. 180 tablet 3  . cyclobenzaprine (FLEXERIL) 10 MG tablet Take 5 mg by mouth at bedtime as needed (TMJ).     Marland Kitchen ibuprofen (ADVIL,MOTRIN) 200 MG tablet Take 200-400 mg by mouth 2 (two) times daily  as needed (hip and back pain).    . LORazepam (ATIVAN) 0.5 MG tablet Take 1 tablet (0.5 mg total) by mouth every 8 (eight) hours as needed. for anxiety 24 tablet 1  . losartan (COZAAR) 50 MG tablet TAKE 1 TABLET(50 MG) BY MOUTH DAILY 90 tablet 1  . Multiple Vitamins-Minerals (ICAPS PO) Take 1 capsule by mouth 2 (two) times daily.     . naproxen sodium (ANAPROX) 220 MG tablet Take 220 mg by mouth 2 (two) times daily as needed (pain). ALEVE    . polyethylene glycol (MIRALAX / GLYCOLAX) packet Take 8.5 g by mouth daily.    . metoprolol succinate (TOPROL XL) 25 MG 24 hr tablet Take 1 tablet (25 mg total) by mouth 2 (two) times daily as needed. 60 tablet 3   No current facility-administered medications for this visit.     Allergies:    Allergies  Allergen Reactions  . Contrast Media  [Iodinated Diagnostic Agents] Other (See Comments)    Gums got numb after a procedure  . Lisinopril Cough    Ok on  losartan  . Penicillins Rash    Social History:  The patient  reports that she has never smoked. She has never used smokeless tobacco. She reports that she drinks alcohol. She reports that she does not use drugs.   Family History  Problem Relation Age of Onset  . Pulmonary embolism Mother   . Depression Mother     major depressive disorder  . Cancer Father     bladder  . Diabetes Brother   . Hyperlipidemia Brother   . Arthritis Brother   . Arthritis Brother     DJD  . Hip fracture Mother     about 63   . Heart attack Neg Hx   . Stroke Maternal Aunt   . Hypertension Mother     ROS:  Please see the history of present illness.   Denies any fevers, chills, orthopnea, PND, syncope   All other systems reviewed and negative.   PHYSICAL EXAM: VS:  BP (!) 150/90   Pulse 90   Ht 5\' 4"  (1.626 m)   Wt 140 lb 1.9 oz (63.6 kg)   LMP  (LMP Unknown)   BMI 24.05 kg/m  Well nourished, well developed, in no acute distress  HEENT: normal, [place]/AT, EOMI Neck: no JVD, normal carotid upstroke, no bruit Cardiac:  normal S1, S2; RRR; no murmur  Lungs:  clear to auscultation bilaterally, no wheezing, rhonchi or rales  Abd: soft, nontender, no hepatomegaly, no bruits  Ext: no edema, 2+ distal pulses Skin: warm and dry  GU: deferred Neuro: no focal abnormalities noted, AAO x 3  EKG:  02/11/14-normal sinus rhythm, 96, no other abnormalities     ASSESSMENT AND PLAN:  1. Tacycardia/elevated heart rate - asked to be seen because of elevated heart rate of 137 in the middle of night. Will check event monitor. ? AFIB. Could have been anxiety as well. EMSs blood pressure was 160/100. I'm comfortable with her taking Toprol as needed. 2. Palpitations-Toprol-XL 25 mg once a day when necessary. Discussed the potential for atrial fibrillation. Hopefully, she is only having premature  ventricular contractions with associated anxiety causing sinus tachycardia as was seen in the emergency department. Ativan is reasonable as well as. No heart murmurs, no syncope, no anginal symptoms. No signs of heart failure. We will continue to monitor clinically. Electrolytes and TSH are normal.  3.  I will see back as needed after monitor  Signed, Candee Furbish, MD [facility]  04/07/2016 9:51 AM

## 2016-04-11 ENCOUNTER — Ambulatory Visit (INDEPENDENT_AMBULATORY_CARE_PROVIDER_SITE_OTHER): Payer: Medicare Other

## 2016-04-11 DIAGNOSIS — R002 Palpitations: Secondary | ICD-10-CM

## 2016-05-04 DIAGNOSIS — H5203 Hypermetropia, bilateral: Secondary | ICD-10-CM | POA: Diagnosis not present

## 2016-05-04 DIAGNOSIS — H353131 Nonexudative age-related macular degeneration, bilateral, early dry stage: Secondary | ICD-10-CM | POA: Diagnosis not present

## 2016-05-04 DIAGNOSIS — H2513 Age-related nuclear cataract, bilateral: Secondary | ICD-10-CM | POA: Diagnosis not present

## 2016-06-30 ENCOUNTER — Other Ambulatory Visit: Payer: Self-pay | Admitting: Internal Medicine

## 2016-06-30 NOTE — Telephone Encounter (Signed)
Last filled 03/23/2016. Please advise.

## 2016-07-01 NOTE — Telephone Encounter (Signed)
Call in #[phone] with no rf

## 2016-07-05 ENCOUNTER — Other Ambulatory Visit: Payer: Self-pay | Admitting: Internal Medicine

## 2016-09-08 DIAGNOSIS — D225 Melanocytic nevi of trunk: Secondary | ICD-10-CM | POA: Diagnosis not present

## 2016-09-08 DIAGNOSIS — Z85828 Personal history of other malignant neoplasm of skin: Secondary | ICD-10-CM | POA: Diagnosis not present

## 2016-09-08 DIAGNOSIS — D2272 Melanocytic nevi of left lower limb, including hip: Secondary | ICD-10-CM | POA: Diagnosis not present

## 2016-09-08 DIAGNOSIS — D224 Melanocytic nevi of scalp and neck: Secondary | ICD-10-CM | POA: Diagnosis not present

## 2016-09-08 DIAGNOSIS — L814 Other melanin hyperpigmentation: Secondary | ICD-10-CM | POA: Diagnosis not present

## 2016-09-08 DIAGNOSIS — L819 Disorder of pigmentation, unspecified: Secondary | ICD-10-CM | POA: Diagnosis not present

## 2016-09-08 DIAGNOSIS — D2271 Melanocytic nevi of right lower limb, including hip: Secondary | ICD-10-CM | POA: Diagnosis not present

## 2016-09-08 DIAGNOSIS — L821 Other seborrheic keratosis: Secondary | ICD-10-CM | POA: Diagnosis not present

## 2016-09-08 DIAGNOSIS — D1801 Hemangioma of skin and subcutaneous tissue: Secondary | ICD-10-CM | POA: Diagnosis not present

## 2016-10-09 ENCOUNTER — Other Ambulatory Visit: Payer: Self-pay | Admitting: Family Medicine

## 2016-10-11 NOTE — Telephone Encounter (Signed)
Call in #[phone] with no rf

## 2016-10-26 DIAGNOSIS — H6123 Impacted cerumen, bilateral: Secondary | ICD-10-CM | POA: Diagnosis not present

## 2016-11-18 ENCOUNTER — Encounter: Payer: Self-pay | Admitting: Internal Medicine

## 2016-12-19 ENCOUNTER — Ambulatory Visit (INDEPENDENT_AMBULATORY_CARE_PROVIDER_SITE_OTHER): Payer: Medicare Other | Admitting: *Deleted

## 2016-12-19 DIAGNOSIS — Z23 Encounter for immunization: Secondary | ICD-10-CM

## 2016-12-24 ENCOUNTER — Other Ambulatory Visit: Payer: Self-pay | Admitting: Internal Medicine

## 2017-01-28 ENCOUNTER — Other Ambulatory Visit: Payer: Self-pay | Admitting: Internal Medicine

## 2017-01-30 ENCOUNTER — Other Ambulatory Visit: Payer: Self-pay | Admitting: Internal Medicine

## 2017-01-30 NOTE — Telephone Encounter (Signed)
Copied from [place] [id number]. Topic: Quick Communication - Rx Refill/Question >> Jan 30, 2017 11:47 AM Scherrie Gerlach wrote: [facility] in [place] calling to request a refill for pt of the  citalopram (CELEXA) 10 MG tablet BID 90 day   [facility] store no  [id number] phone [phone]  fax  [phone] [address]  [place] , [place] [phone]

## 2017-02-08 ENCOUNTER — Ambulatory Visit (INDEPENDENT_AMBULATORY_CARE_PROVIDER_SITE_OTHER)
Admission: RE | Admit: 2017-02-08 | Discharge: 2017-02-08 | Disposition: A | Discharge status: Home / Self Care | Payer: Medicare Other | Source: Ambulatory Visit | Attending: Family Medicine | Admitting: Family Medicine

## 2017-02-08 ENCOUNTER — Ambulatory Visit (INDEPENDENT_AMBULATORY_CARE_PROVIDER_SITE_OTHER): Payer: Medicare Other | Admitting: Family Medicine

## 2017-02-08 ENCOUNTER — Encounter: Payer: Self-pay | Admitting: Family Medicine

## 2017-02-08 VITALS — BP 158/90 | HR 74 | Temp 98.0°F | Wt 146.0 lb

## 2017-02-08 DIAGNOSIS — R1012 Left upper quadrant pain: Secondary | ICD-10-CM

## 2017-02-08 DIAGNOSIS — R109 Unspecified abdominal pain: Secondary | ICD-10-CM | POA: Diagnosis not present

## 2017-02-08 DIAGNOSIS — R1032 Left lower quadrant pain: Secondary | ICD-10-CM | POA: Diagnosis not present

## 2017-02-08 LAB — POC URINALSYSI DIPSTICK (AUTOMATED)
Bilirubin, UA: NEGATIVE
Glucose, UA: NEGATIVE
Ketones, UA: NEGATIVE
Leukocytes, UA: NEGATIVE
Nitrite, UA: NEGATIVE
PH UA: 6 (ref 5.0–8.0)
PROTEIN UA: NEGATIVE
SPEC GRAV UA: 1.02 (ref 1.010–1.025)
UROBILINOGEN UA: 0.2 U/dL

## 2017-02-08 MED ORDER — HYDROCODONE-ACETAMINOPHEN 10-325 MG PO TABS: 1.0000 | ORAL_TABLET | Freq: Three times a day (TID) | ORAL | 0 refills | Status: DC | PRN

## 2017-02-08 NOTE — Patient Instructions (Signed)
Vicodin.........Marland Kitchen 1/2-1 tablet every 4-6 hours when necessary for severe pain  Drink lots of liquids  Strain your urine's  We'll get you set up for a CT scan ASAP,,,,,,,,,,, results will be called to your PCP Dr. Tomasita Crumble

## 2017-02-08 NOTE — Progress Notes (Signed)
Nicole Wood is a 71 year old female who comes in today with a four-day history of left flank pain and hematuria  She states Saturday evening she developed a sudden onset of severe left groin pain radiated up into the left part of her back. It's coming and going. She describes the pain is worst is a 10 on a scale of 1-10. Her pain pattern seems to be the same [name] for [place] stop part 4 hours stop.  Never had a history of kidney stones in the past.  BP (!) 158/90 (BP Location: Left Arm, Patient Position: Sitting, Cuff Size: Normal)   Pulse 74   Temp 98 F (36.7 C) (Oral)   Wt 146 lb (66.2 kg)   LMP  (LMP Unknown)   BMI 25.06 kg/m  Well-developed well nourished female no acute distress this point. Back exam normal  Urinalysis shows 2+ blood  #1 left flank pain and hematuria,,,,,,,, most likely kidney stone,,,,,,,,,, scan to determine size and location of stone,,,,,,, Vicodin for pain,,,,,, follow-up by her PCP in 48 hours

## 2017-02-09 ENCOUNTER — Telehealth: Payer: Self-pay | Admitting: Internal Medicine

## 2017-02-09 NOTE — Telephone Encounter (Signed)
Copied from [place]. Topic: Quick Communication - See Telephone Encounter >> Feb 09, 2017  9:59 AM Cleaster Corin, NT wrote: CRM for notification. See Telephone encounter for:   02/09/17. Pt. Calling to get results on scan that was done yesterday. Please give pt. A call at [phone]

## 2017-02-09 NOTE — Telephone Encounter (Signed)
Dr Regis Bill please advise on results as soon as available. Thanks.

## 2017-02-10 ENCOUNTER — Encounter: Payer: Self-pay | Admitting: Adult Health

## 2017-02-10 ENCOUNTER — Ambulatory Visit (INDEPENDENT_AMBULATORY_CARE_PROVIDER_SITE_OTHER): Payer: Medicare Other | Admitting: Adult Health

## 2017-02-10 VITALS — BP 150/80 | Temp 98.1°F | Wt 146.0 lb

## 2017-02-10 DIAGNOSIS — R1032 Left lower quadrant pain: Secondary | ICD-10-CM

## 2017-02-10 LAB — CBC WITH DIFFERENTIAL/PLATELET
BASOS ABS: 0 10*3/uL (ref 0.0–0.1)
BASOS PCT: 0.7 % (ref 0.0–3.0)
EOS ABS: 0.2 10*3/uL (ref 0.0–0.7)
Eosinophils Relative: 3.7 % (ref 0.0–5.0)
HEMATOCRIT: 36.5 % (ref 36.0–46.0)
Hemoglobin: 11.9 g/dL — ABNORMAL LOW (ref 12.0–15.0)
LYMPHS ABS: 1.9 10*3/uL (ref 0.7–4.0)
LYMPHS PCT: 30.7 % (ref 12.0–46.0)
MCHC: 32.5 g/dL (ref 30.0–36.0)
MCV: 95.5 fl (ref 78.0–100.0)
Monocytes Absolute: 0.6 10*3/uL (ref 0.1–1.0)
Monocytes Relative: 9.9 % (ref 3.0–12.0)
NEUTROS ABS: 3.3 10*3/uL (ref 1.4–7.7)
NEUTROS PCT: 55 % (ref 43.0–77.0)
PLATELETS: 288 10*3/uL (ref 150.0–400.0)
RBC: 3.83 Mil/uL — ABNORMAL LOW (ref 3.87–5.11)
RDW: 13.4 % (ref 11.5–15.5)
WBC: 6.1 10*3/uL (ref 4.0–10.5)

## 2017-02-10 LAB — COMPREHENSIVE METABOLIC PANEL
ALT: 8 U/L (ref 0–35)
AST: 12 U/L (ref 0–37)
Albumin: 4.1 g/dL (ref 3.5–5.2)
Alkaline Phosphatase: 74 U/L (ref 39–117)
BILIRUBIN TOTAL: 0.5 mg/dL (ref 0.2–1.2)
BUN: 15 mg/dL (ref 6–23)
CALCIUM: 9.1 mg/dL (ref 8.4–10.5)
CHLORIDE: 102 meq/L (ref 96–112)
CO2: 31 meq/L (ref 19–32)
CREATININE: 0.91 mg/dL (ref 0.40–1.20)
GFR: 64.6 mL/min (ref >60.00)
GLUCOSE: 93 mg/dL (ref 70–99)
Potassium: 4.5 mEq/L (ref 3.5–5.1)
SODIUM: 138 meq/L (ref 135–145)
Total Protein: 6.4 g/dL (ref 6.0–8.3)

## 2017-02-10 NOTE — Progress Notes (Signed)
Subjective:    Patient ID: Nicole Wood, female    DOB: [birth year], 71 y.o.   MRN: [id number]  HPI  71 year old female who  has a past medical history of Basal cell cancer, Cystocele, Depression, Hematuria, Osteoarthritis, Osteopenia, and Urinary incontinence. She is a patient of Dr. Regis Bill who I am seeing today for follow up after she was seen by Dr. Sherren Mocha for acute left flank pain. Per MD Todd's notes  Four day history of abd pain ( 6 days at this point). Developed a sudden onset of severe left groin pain radiating up the left part of her back. Pain was intermittent and was 10/10 at it's worse.   She has never had a history of kidney stones in the past.   UA showed 2+ blood  CT scan of the abdomen showed:  IMPRESSION: No evidence of obstructive uropathy. No evidence of nephrolithiasis. 7 mm hypoattenuated lesion adjacent to the gallbladder fundus, too small to be accurately characterize by CT.  Today she reports that the pain is less intense then before but that she continues to have some discomfort in her left lower groin. Pain is 7/10 at it's worse. She reports that she has noticed that warm water in the shower helps with discomfort. She denies any fevers, n/v/d. She has been taking Aleve when needed but has not had great relief with this    Review of Systems   See HPI    Past Medical History:  Diagnosis Date  . Basal cell cancer    nose  . Cystocele   . Depression    in the past  . Hematuria    idiopathic  . Osteoarthritis   . Osteopenia    dexa 2009, -2.3 osteoporosis -2.5 hip 2011  . Urinary incontinence     Social History   Socioeconomic History  . Marital status: Married    Spouse name: Not on file  . Number of children: Not on file  . Years of education: Not on file  . Highest education level: Not on file  Social Needs  . Financial resource strain: Not on file  . Food insecurity - worry: Not on file  . Food insecurity - inability: Not on file  .  Transportation needs - medical: Not on file  . Transportation needs - non-medical: Not on file  Occupational History  . Not on file  Tobacco Use  . Smoking status: Never Smoker  . Smokeless tobacco: Never Used  Substance and Sexual Activity  . Alcohol use: Yes  . Drug use: No  . Sexual activity: Yes    Partners: Female  Other Topics Concern  . Not on file  Social History Narrative   Retired Arts development officer) some college   Married 4 children [place] char[place],, [place], and [place], and [place] Husband cancer MM. NHL in remission   Regular exercise- yes   HH of 2   No pets    Multiple myeloma and non hogkins lymphoma in husband x 6 years   Regular exercise: walk, swim, some biking   Caffeine use: coffee daily, sweet tea    Past Surgical History:  Procedure Laterality Date  . ABDOMINAL HYSTERECTOMY     fibroids and cyst right ovary left oopherectomy  . basal cell ca removed    . bladder tack    . rt shoulder surgery   2004   [place]  . URETHRAL DILATION      Family History  Problem Relation Age of Onset  .  Pulmonary embolism Mother   . Depression Mother        major depressive disorder  . Cancer Father        bladder  . Diabetes Brother   . Hyperlipidemia Brother   . Arthritis Brother   . Arthritis Brother        DJD  . Hip fracture Mother        about 18   . Heart attack Neg Hx   . Stroke Maternal Aunt   . Hypertension Mother     Allergies  Allergen Reactions  . Contrast Media [Iodinated Diagnostic Agents] Other (See Comments)    Gums got numb after a procedure  . Lisinopril Cough    Ok on  losartan  . Penicillins Rash    Current Outpatient Medications on File Prior to Visit  Medication Sig Dispense Refill  . bifidobacterium infantis (ALIGN) capsule Take 1 capsule by mouth daily.    . calcium-vitamin D (OSCAL WITH D) 500-200 MG-UNIT per tablet Take 1 tablet by mouth daily.      . citalopram (CELEXA) 10 MG tablet TAKE 1 TABLET BY MOUTH TWICE DAILY 180 tablet 0  .  cyclobenzaprine (FLEXERIL) 10 MG tablet Take 5 mg by mouth at bedtime as needed (TMJ).     Marland Kitchen HYDROcodone-acetaminophen (NORCO) 10-325 MG tablet Take 1 tablet by mouth every 8 (eight) hours as needed. 30 tablet 0  . ibuprofen (ADVIL,MOTRIN) 200 MG tablet Take 200-400 mg by mouth 2 (two) times daily as needed (hip and back pain).    . LORazepam (ATIVAN) 0.5 MG tablet TAKE 1 TABLET BY MOUTH EVERY 8 HOURS AS NEEDED 24 tablet 0  . losartan (COZAAR) 50 MG tablet TAKE 1 TABLET(50 MG) BY MOUTH DAILY 30 tablet 0  . metoprolol succinate (TOPROL XL) 25 MG 24 hr tablet Take 1 tablet (25 mg total) by mouth 2 (two) times daily as needed. 60 tablet 3  . Multiple Vitamins-Minerals (ICAPS PO) Take 1 capsule by mouth 2 (two) times daily.     . naproxen sodium (ANAPROX) 220 MG tablet Take 220 mg by mouth 2 (two) times daily as needed (pain). ALEVE    . polyethylene glycol (MIRALAX / GLYCOLAX) packet Take 8.5 g by mouth daily.     No current facility-administered medications on file prior to visit.     BP (!) 150/80 (BP Location: Left Arm)   Temp 98.1 F (36.7 C) (Oral)   Wt 146 lb (66.2 kg)   LMP  (LMP Unknown)   BMI 25.06 kg/m       Objective:   Physical Exam  Constitutional: She is oriented to person, place, and time. She appears well-developed and well-nourished. No distress.  Cardiovascular: Normal rate, regular rhythm, normal heart sounds and intact distal pulses. Exam reveals no gallop.  No murmur heard. Pulmonary/Chest: Effort normal and breath sounds normal. No respiratory distress. She has no wheezes. She has no rales. She exhibits no tenderness.  Abdominal: Soft. Bowel sounds are normal. She exhibits no distension and no mass. There is no tenderness. There is no rebound and no guarding.  Musculoskeletal:  Np discomfort with ROM exercises of left hip/leg  Neurological: She is alert and oriented to person, place, and time.  Skin: Skin is warm and dry. No rash noted. She is not diaphoretic. No  erythema. No pallor.  Psychiatric: She has a normal mood and affect. Her behavior is normal. Judgment and thought content normal.  Nursing note and vitals reviewed.  Assessment & Plan:  1. Left lower quadrant pain - Pain is improving.  - reviewed CT scan with patient.  - At this point I am thinking more muscular in nature.  - CMP - CBC with Differential/Platelet - Will have her use heating pad - She is going to follow up with Dr. Regis Bill next week concerning BP   Dorothyann Peng, NP

## 2017-02-20 NOTE — Telephone Encounter (Signed)
This was ordered by Dr Sherren Mocha and  Pt had visit with CN   I have been out of office for the past 10 days  When this message came in    [place] all is   Completed.

## 2017-02-20 NOTE — Telephone Encounter (Signed)
Noted. Per 02/10/17 OV notes, CT results were reviewed with patient.

## 2017-02-20 NOTE — Progress Notes (Signed)
Chief Complaint  Patient presents with  . Follow-up    HPI: Nicole Wood 71 y.o. come in for   Fu BP readings   Evaluation for  Left groin flank pain  With  Ct sca negative  For acute findings   But bp was in the 150 + range at that time.   Taking  Her losartan with out problem  Feels better today  Has wrist machine at home but no recent check .  Lab done last week .   Pain may have been coming from her back  Has hx fo djd of back.  Asks for refill ativan last taken in novemeber   Refilled august 24  ROS: See pertinent positives and negatives per HPI.  Past Medical History:  Diagnosis Date  . Basal cell cancer    nose  . Cystocele   . Depression    in the past  . Hematuria    idiopathic  . Osteoarthritis   . Osteopenia    dexa 2009, -2.3 osteoporosis -2.5 hip 2011  . Urinary incontinence     Family History  Problem Relation Age of Onset  . Pulmonary embolism Mother   . Depression Mother        major depressive disorder  . Cancer Father        bladder  . Diabetes Brother   . Hyperlipidemia Brother   . Arthritis Brother   . Arthritis Brother        DJD  . Hip fracture Mother        about 37   . Heart attack Neg Hx   . Stroke Maternal Aunt   . Hypertension Mother     Social History   Socioeconomic History  . Marital status: Married    Spouse name: None  . Number of children: None  . Years of education: None  . Highest education level: None  Social Needs  . Financial resource strain: None  . Food insecurity - worry: None  . Food insecurity - inability: None  . Transportation needs - medical: None  . Transportation needs - non-medical: None  Occupational History  . None  Tobacco Use  . Smoking status: Never Smoker  . Smokeless tobacco: Never Used  Substance and Sexual Activity  . Alcohol use: Yes  . Drug use: No  . Sexual activity: Yes    Partners: Female  Other Topics Concern  . None  Social History Narrative   Retired Arts development officer) some college     Married 4 children [place],, Midwife, and [place], and [place] Husband cancer MM. NHL in remission   Regular exercise- yes   HH of 2   No pets    Multiple myeloma and non hogkins lymphoma in husband x 6 years   Regular exercise: walk, swim, some biking   Caffeine use: coffee daily, sweet tea    Outpatient Medications Prior to Visit  Medication Sig Dispense Refill  . bifidobacterium infantis (ALIGN) capsule Take 1 capsule by mouth daily.    . calcium-vitamin D (OSCAL WITH D) 500-200 MG-UNIT per tablet Take 1 tablet by mouth daily.      . citalopram (CELEXA) 10 MG tablet TAKE 1 TABLET BY MOUTH TWICE DAILY 180 tablet 0  . cyclobenzaprine (FLEXERIL) 10 MG tablet Take 5 mg by mouth at bedtime as needed (TMJ).     Marland Kitchen HYDROcodone-acetaminophen (NORCO) 10-325 MG tablet Take 1 tablet by mouth every 8 (eight) hours as needed. 30 tablet 0  .  ibuprofen (ADVIL,MOTRIN) 200 MG tablet Take 200-400 mg by mouth 2 (two) times daily as needed (hip and back pain).    Marland Kitchen losartan (COZAAR) 50 MG tablet TAKE 1 TABLET(50 MG) BY MOUTH DAILY 30 tablet 0  . metoprolol succinate (TOPROL XL) 25 MG 24 hr tablet Take 1 tablet (25 mg total) by mouth 2 (two) times daily as needed. 60 tablet 3  . Multiple Vitamins-Minerals (ICAPS PO) Take 1 capsule by mouth 2 (two) times daily.     . naproxen sodium (ANAPROX) 220 MG tablet Take 220 mg by mouth 2 (two) times daily as needed (pain). ALEVE    . polyethylene glycol (MIRALAX / GLYCOLAX) packet Take 8.5 g by mouth daily.    Marland Kitchen LORazepam (ATIVAN) 0.5 MG tablet TAKE 1 TABLET BY MOUTH EVERY 8 HOURS AS NEEDED 24 tablet 0   No facility-administered medications prior to visit.      EXAM:  BP 110/70 (BP Location: Left Arm, Patient Position: Sitting, Cuff Size: Normal)   Pulse 74   Temp 97.6 F (36.4 C) (Oral)   Wt 146 lb (66.2 kg)   LMP  (LMP Unknown)   BMI 25.06 kg/m   Body mass index is 25.06 kg/m.  GENERAL: vitals reviewed and listed above, alert, oriented,  appears well hydrated and in no acute distress HEENT: atraumatic, conjunctiva  clear, no obvious abnormalities on inspection of external nose and ears NECK: no obvious masses on inspection palpation  LUNGS: clear to auscultation bilaterally, no wheezes, rales or rhonchi, good air movement CV: HRRR, 78 no clubbing cyanosis or  peripheral edema nl cap refill  MS: moves all extremities without noticeable focal  abnormality PSYCH: pleasant and cooperative, no obvious depression or anxiety  Seems stable  Lab Results  Component Value Date   WBC 6.1 02/10/2017   HGB 11.9 (L) 02/10/2017   HCT 36.5 02/10/2017   PLT 288.0 02/10/2017   GLUCOSE 93 02/10/2017   CHOL 188 01/14/2015   TRIG 83.0 01/14/2015   HDL 60.20 01/14/2015   LDLDIRECT 137.2 12/14/2010   LDLCALC 111 (H) 01/14/2015   ALT 8 02/10/2017   AST 12 02/10/2017   NA 138 02/10/2017   K 4.5 02/10/2017   CL 102 02/10/2017   CREATININE 0.91 02/10/2017   BUN 15 02/10/2017   CO2 31 02/10/2017   TSH 2.77 01/14/2015   HGBA1C 5.6 01/14/2015   BP Readings from Last 3 Encounters:  02/22/17 110/70  02/10/17 (!) 150/80  02/08/17 (!) 158/90   Wt Readings from Last 3 Encounters:  02/22/17 146 lb (66.2 kg)  02/10/17 146 lb (66.2 kg)  02/08/17 146 lb (66.2 kg)     ASSESSMENT AND PLAN:  Discussed the following assessment and plan:  Essential hypertension  Medication management  Anxiety state - doing well  only  as needed lorazepam  informatino reviewed reassuring exam  Pain may hve caused inc bp respponse but  Better now   Will follow  Up at   Yearly in [place] and she will bring in readings  No change in med intervention at this time -Patient advised to return or notify health care team  if  new concerns arise.  Patient Instructions  It is possible that bp was up casue of pain and situation Check like 3 days in a row   Twice  Bring in   Readings etc to  Visit in January  And will go from there  Stay on same meds for now     [place]  KRegis Bill M.D.

## 2017-02-22 ENCOUNTER — Encounter: Payer: Self-pay | Admitting: Internal Medicine

## 2017-02-22 ENCOUNTER — Ambulatory Visit (INDEPENDENT_AMBULATORY_CARE_PROVIDER_SITE_OTHER): Payer: Medicare Other | Admitting: Internal Medicine

## 2017-02-22 VITALS — BP 110/70 | HR 74 | Temp 97.6°F | Wt 146.0 lb

## 2017-02-22 DIAGNOSIS — Z79899 Other long term (current) drug therapy: Secondary | ICD-10-CM | POA: Diagnosis not present

## 2017-02-22 DIAGNOSIS — F411 Generalized anxiety disorder: Secondary | ICD-10-CM | POA: Diagnosis not present

## 2017-02-22 DIAGNOSIS — I10 Essential (primary) hypertension: Secondary | ICD-10-CM

## 2017-02-22 MED ORDER — LORAZEPAM 0.5 MG PO TABS
0.5000 mg | ORAL_TABLET | Freq: Three times a day (TID) | ORAL | 0 refills | Status: DC | PRN
Start: 2017-02-22 — End: 2017-04-12

## 2017-02-22 NOTE — Patient Instructions (Signed)
It is possible that bp was up casue of pain and situation Check like 3 days in a row   Twice  Bring in   Readings etc to  Visit in January  And will go from there  Stay on same meds for now

## 2017-02-27 ENCOUNTER — Other Ambulatory Visit: Payer: Self-pay | Admitting: Cardiology

## 2017-03-01 DIAGNOSIS — S92334A Nondisplaced fracture of third metatarsal bone, right foot, initial encounter for closed fracture: Secondary | ICD-10-CM | POA: Diagnosis not present

## 2017-03-15 ENCOUNTER — Encounter: Payer: Medicare Other | Admitting: Internal Medicine

## 2017-03-15 DIAGNOSIS — S92334D Nondisplaced fracture of third metatarsal bone, right foot, subsequent encounter for fracture with routine healing: Secondary | ICD-10-CM | POA: Diagnosis not present

## 2017-03-30 ENCOUNTER — Other Ambulatory Visit: Payer: Self-pay | Admitting: Cardiology

## 2017-04-05 DIAGNOSIS — S92334D Nondisplaced fracture of third metatarsal bone, right foot, subsequent encounter for fracture with routine healing: Secondary | ICD-10-CM | POA: Diagnosis not present

## 2017-04-12 ENCOUNTER — Other Ambulatory Visit: Payer: Self-pay | Admitting: Internal Medicine

## 2017-04-13 NOTE — Telephone Encounter (Signed)
Last filled 02/22/17, #24 x 0rf Last ov 02/22/17  Please advise Dr Regis Bill, thanks.

## 2017-04-27 ENCOUNTER — Telehealth: Payer: Self-pay | Admitting: Cardiology

## 2017-04-27 NOTE — Telephone Encounter (Signed)
Pt c/o BP issue: STAT if pt c/o blurred vision, one-sided weakness or slurred speech  1. What are your last 5 BP readings? 161/97   2. Are you having any other symptoms (ex. Dizziness, headache, blurred vision, passed out)? No   3. What is your BP issue? Patient states that she is having "episodes" where BP where goes up and has been happening every night.

## 2017-04-27 NOTE — Telephone Encounter (Signed)
Lm to cb to discuss concerns r/t BP.

## 2017-04-27 NOTE — Telephone Encounter (Signed)
Attempted to contact patient again by phone and call was sent directly to voicemail.  I did not leave another message to call back at this time.

## 2017-05-02 NOTE — Progress Notes (Signed)
Chief Complaint  Patient presents with  . Annual Exam    Pt broke foot in Dec 2018 - dealing with swelling but is improving.     HPI: Nicole Wood 72 y.o. comes in today for  Yearly visit  meds and exam  .Since last visit.   had fractured foot right . Less activity but out of boot .    Had been well but recently had episode of awakening of racing heart and took bp and hr and was   150 and  High bp episodes and     Took an ativan to help .  Uncertain  But eventually get better  And since then taking  Metoprolol daily instead of prn .    Citalopram seems to help   Mood prett stable   Health Maintenance  Topic Date Due  . TETANUS/TDAP  02/28/2018  . MAMMOGRAM  03/31/2018  . COLONOSCOPY  12/02/2019  . INFLUENZA VACCINE  Completed  . DEXA SCAN  Completed  . Hepatitis C Screening  Completed  . PNA vac Low Risk Adult  Completed   Health Maintenance Review LIFESTYLE:  Exercise:   Tobacco/ETS: no Alcohol:   Not  Now   Sugar beverages:   No  Lots s of water  Sleep:  6-7 hours    Drug use: no HH:  2  No  Broke foot in December 31   Cast on foot and boot    ROS:  GEN/ HEENT: No fever, significant weight changes sweats headaches vision problems hearing changes, CV/ PULM; No chest pain shortness of breath cough, syncope,edema  change in exercise tolerance. GI /GU: No adominal pain, vomiting, change in bowel habits. No blood in the stool. No significant GU symptoms. SKIN/HEME: ,no acute skin rashes suspicious lesions or bleeding. No lymphadenopathy, nodules, masses.  NEURO/ PSYCH:  No neurologic signs such as weakness numbness. No depression anxiety. IMM/ Allergy: No unusual infections.  Allergy .   REST of 12 system review negative except as per HPI   Past Medical History:  Diagnosis Date  . Basal cell cancer    nose  . Cystocele   . Depression    in the past  . Hematuria    idiopathic  . Osteoarthritis   . Osteopenia    dexa 2009, -2.3 osteoporosis -2.5 hip 2011    . Urinary incontinence     Family History  Problem Relation Age of Onset  . Pulmonary embolism Mother   . Depression Mother        major depressive disorder  . Cancer Father        bladder  . Diabetes Brother   . Hyperlipidemia Brother   . Arthritis Brother   . Arthritis Brother        DJD  . Hip fracture Mother        about 54   . Heart attack Neg Hx   . Stroke Maternal Aunt   . Hypertension Mother     Social History   Socioeconomic History  . Marital status: Married    Spouse name: None  . Number of children: None  . Years of education: None  . Highest education level: None  Social Needs  . Financial resource strain: None  . Food insecurity - worry: None  . Food insecurity - inability: None  . Transportation needs - medical: None  . Transportation needs - non-medical: None  Occupational History  . None  Tobacco Use  . Smoking status: Never  Smoker  . Smokeless tobacco: Never Used  Substance and Sexual Activity  . Alcohol use: Yes  . Drug use: No  . Sexual activity: Yes    Partners: Female  Other Topics Concern  . 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 NHL in remission   Regular exercise- yes   HH of 2   No pets    Multiple myeloma and non hogkins lymphoma in husband x 6 years   Regular exercise: walk, swim, some biking   Caffeine use: coffee daily, sweet tea    Outpatient Encounter Medications as of 05/03/2017  Medication Sig  . bifidobacterium infantis (ALIGN) capsule Take 1 capsule by mouth daily.  . calcium-vitamin D (OSCAL WITH D) 500-200 MG-UNIT per tablet Take 1 tablet by mouth daily.    . citalopram (CELEXA) 10 MG tablet TAKE 1 TABLET BY MOUTH TWICE DAILY  . cyclobenzaprine (FLEXERIL) 10 MG tablet Take 5 mg by mouth at bedtime as needed (TMJ).   Marland Kitchen HYDROcodone-acetaminophen (NORCO) 10-325 MG tablet Take 1 tablet by mouth every 8  (eight) hours as needed.  Marland Kitchen ibuprofen (ADVIL,MOTRIN) 200 MG tablet Take 200-400 mg by mouth 2 (two) times daily as needed (hip and back pain).  . LORazepam (ATIVAN) 0.5 MG tablet TAKE 1 TABLET(0.5 MG) BY MOUTH EVERY 8 HOURS AS NEEDED  . losartan (COZAAR) 50 MG tablet TAKE 1 TABLET(50 MG) BY MOUTH DAILY  . metoprolol succinate (TOPROL-XL) 25 MG 24 hr tablet TAKE 1 TABLET(25 MG) BY MOUTH TWICE DAILY AS NEEDED  . metoprolol succinate (TOPROL-XL) 25 MG 24 hr tablet TAKE 1 TABLET(25 MG) BY MOUTH TWICE DAILY AS NEEDED  . Multiple Vitamins-Minerals (ICAPS PO) Take 1 capsule by mouth 2 (two) times daily.   . naproxen sodium (ANAPROX) 220 MG tablet Take 220 mg by mouth 2 (two) times daily as needed (pain). ALEVE  . polyethylene glycol (MIRALAX / GLYCOLAX) packet Take 8.5 g by mouth daily.   No facility-administered encounter medications on file as of 05/03/2017.     EXAM:  BP 102/60 (BP Location: Right Arm, Patient Position: Sitting, Cuff Size: Normal)   Pulse 72   Temp 97.9 F (36.6 C) (Oral)   Ht 5' 3.5" (1.613 m)   Wt 143 lb 12.8 oz (65.2 kg)   LMP  (LMP Unknown)   BMI 25.07 kg/m   Body mass index is 25.07 kg/m.  Physical Exam: Vital signs reviewed FTD:DUKG is a well-developed well-nourished alert cooperative   who appears stated age in no acute distress.  HEENT: normocephalic atraumatic , Eyes: PERRL EOM's full, conjunctiva clear, Nares: paten,t no deformity discharge or tenderness., Ears: no deformity EAC's clear TMs with normal landmarks. Mouth: clear OP, no lesions, edema.  Moist mucous membranes. Dentition in adequate repair. NECK: supple without masses, thyromegaly or bruits. CHEST/PULM:  Clear to auscultation and percussion breath sounds equal no wheeze , rales or rhonchi. No chest wall deformities or tenderness. CV: PMI is nondisplaced, S1 S2 no gallops, murmurs, rubs. Peripheral pulses are full without delay.No JVD . Breast: normal by inspection . No dimpling, discharge, masses,  tenderness or discharge . ABDOMEN: Bowel sounds normal nontender  No guard or rebound, no hepato splenomegal no CVA tenderness.   Extremtities:  No clubbing cyanosis or edema, no acute joint swelling or redness no focal atrophy  Right foot some swelling dorsal area   Nl pulses  NEURO:  Oriented  x3, cranial nerves 3-12 appear to be intact, no obvious focal weakness,gait within normal limits no abnormal reflexes or asymmetrical SKIN: No acute rashes normal turgor, color, no bruising or petechiae. PSYCH: Oriented, good eye contact, no obvious depression anxiety, cognition and judgment appear normal. LN: no cervical axillary i adenopathy No noted deficits in memory, attention, and speech.   Lab Results  Component Value Date   WBC 5.9 05/04/2017   HGB 12.4 05/04/2017   HCT 36.1 05/04/2017   PLT 296.0 05/04/2017   GLUCOSE 93 02/10/2017   CHOL 146 05/04/2017   TRIG 129.0 05/04/2017   HDL 42.30 05/04/2017   LDLDIRECT 137.2 12/14/2010   LDLCALC 78 05/04/2017   ALT 8 02/10/2017   AST 12 02/10/2017   NA 138 02/10/2017   K 4.5 02/10/2017   CL 102 02/10/2017   CREATININE 0.91 02/10/2017   BUN 15 02/10/2017   CO2 31 02/10/2017   TSH 3.20 05/04/2017   HGBA1C 5.6 01/14/2015    ASSESSMENT AND PLAN:  Discussed the following assessment and plan:  Essential hypertension - Plan: Lipid panel, TSH, T4, free, CBC with Differential/Platelet, VITAMIN D 25 Hydroxy (Vit-D Deficiency, Fractures), VITAMIN D 25 Hydroxy (Vit-D Deficiency, Fractures), CBC with Differential/Platelet, T4, free, TSH, Lipid panel  Medication management - Plan: Lipid panel, TSH, T4, free, CBC with Differential/Platelet, VITAMIN D 25 Hydroxy (Vit-D Deficiency, Fractures), VITAMIN D 25 Hydroxy (Vit-D Deficiency, Fractures), CBC with Differential/Platelet, T4, free, TSH, Lipid panel  Anxiety state - Plan: Lipid panel, TSH, T4, free, CBC with Differential/Platelet, VITAMIN D 25 Hydroxy (Vit-D Deficiency, Fractures), VITAMIN D 25  Hydroxy (Vit-D Deficiency, Fractures), CBC with Differential/Platelet, T4, free, TSH, Lipid panel  Elevated cholesterol - Plan: Lipid panel, TSH, T4, free, CBC with Differential/Platelet, VITAMIN D 25 Hydroxy (Vit-D Deficiency, Fractures), VITAMIN D 25 Hydroxy (Vit-D Deficiency, Fractures), CBC with Differential/Platelet, T4, free, TSH, Lipid panel  Tachycardia episode - Plan: Lipid panel, TSH, T4, free, CBC with Differential/Platelet, VITAMIN D 25 Hydroxy (Vit-D Deficiency, Fractures), VITAMIN D 25 Hydroxy (Vit-D Deficiency, Fractures), CBC with Differential/Platelet, T4, free, TSH, Lipid panel  Osteoporosis, unspecified osteoporosis type, unspecified pathological fracture presence - Plan: DG Bone Density, Lipid panel, TSH, T4, free, CBC with Differential/Platelet, VITAMIN D 25 Hydroxy (Vit-D Deficiency, Fractures), VITAMIN D 25 Hydroxy (Vit-D Deficiency, Fractures), CBC with Differential/Platelet, T4, free, TSH, Lipid panel  Estrogen deficiency - Plan: DG Bone Density  Patient Care Team: Burnis Medin, MD as PCP - General Ninetta Lights, MD (Orthopedic Surgery) Rolm Bookbinder, MD (Dermatology) Frederik Schmidt, MD as Attending Physician (Oral Surgery)  Patient Instructions   Advise updated bone density .   Will put in order and you get appt.,  Can do a vit  D leve with  Blood test.   See cardiology about the episode of 150 HR  .   Will notify you  of labs when available. Fu depending on labs and   How  You are doing  Or 6 months  Can get shingrix vaccine    In future      Bone Health Bones protect organs, store calcium, and anchor muscles. Good health habits, such as eating nutritious foods and exercising regularly, are important for maintaining healthy bones. They can also help to prevent a condition that causes bones to lose density and become weak and brittle (osteoporosis). Why is bone mass important? Bone mass refers to the amount of bone tissue that you have. The higher your  bone mass, the stronger your bones. An important step  toward having healthy bones throughout life is to have strong and dense bones during childhood. A young adult who has a high bone mass is more likely to have a high bone mass later in life. Bone mass at its greatest it is called peak bone mass. A large decline in bone mass occurs in older adults. In women, it occurs about the time of menopause. During this time, it is important to practice good health habits, because if more bone is lost than what is replaced, the bones will become less healthy and more likely to break (fracture). If you find that you have a low bone mass, you may be able to prevent osteoporosis or further bone loss by changing your diet and lifestyle. How can I find out if my bone mass is low? Bone mass can be measured with an X-ray test that is called a bone mineral density (BMD) test. This test is recommended for all women who are age 87 or older. It may also be recommended for men who are age 65 or older, or for people who are more likely to develop osteoporosis due to:  Having bones that break easily.  Having a long-term disease that weakens bones, such as kidney disease or rheumatoid arthritis.  Having menopause earlier than normal.  Taking medicine that weakens bones, such as steroids, thyroid hormones, or hormone treatment for breast cancer or prostate cancer.  Smoking.  Drinking three or more alcoholic drinks each day.  What are the nutritional recommendations for healthy bones? To have healthy bones, you need to get enough of the right minerals and vitamins. Most nutrition experts recommend getting these nutrients from the foods that you eat. Nutritional recommendations vary from person to person. Ask your health care provider what is healthy for you. Here are some general guidelines. Calcium Recommendations Calcium is the most important (essential) mineral for bone health. Most people can get enough calcium from  their diet, but supplements may be recommended for people who are at risk for osteoporosis. Good sources of calcium include:  Dairy products, such as low-fat or nonfat milk, cheese, and yogurt.  Dark green leafy vegetables, such as bok choy and broccoli.  Calcium-fortified foods, such as orange juice, cereal, bread, soy beverages, and tofu products.  Nuts, such as almonds.  Follow these recommended amounts for daily calcium intake:  Children, age 83?3: 700 mg.  Children, age 46?8: 1,000 mg.  Children, age 61?13: 1,300 mg.  Teens, age 63?18: 1,300 mg.  Adults, age 60?50: 1,000 mg.  Adults, age 39?70: ? Men: 1,000 mg. ? Women: 1,200 mg.  Adults, age 462 or older: 1,200 mg.  Pregnant and breastfeeding females: ? Teens: 1,300 mg. ? Adults: 1,000 mg.  Vitamin D Recommendations Vitamin D is the most essential vitamin for bone health. It helps the body to absorb calcium. Sunlight stimulates the skin to make vitamin D, so be sure to get enough sunlight. If you live in a cold climate or you do not get outside often, your health care provider may recommend that you take vitamin D supplements. Good sources of vitamin D in your diet include:  Egg yolks.  Saltwater fish.  Milk and cereal fortified with vitamin D.  Follow these recommended amounts for daily vitamin D intake:  Children and teens, age 832?18: 71 international units.  Adults, age 22 or younger: 400-800 international units.  Adults, age 831 or older: 800-1,000 international units.  Other Nutrients Other nutrients for bone health include:  Phosphorus. This mineral is  found in meat, poultry, dairy foods, nuts, and legumes. The recommended daily intake for adult men and adult women is 700 mg.  Magnesium. This mineral is found in seeds, nuts, dark green vegetables, and legumes. The recommended daily intake for adult men is 400?420 mg. For adult women, it is 310?320 mg.  Vitamin K. This vitamin is found in green leafy  vegetables. The recommended daily intake is 120 mg for adult men and 90 mg for adult women.  What type of physical activity is best for building and maintaining healthy bones? Weight-bearing and strength-building activities are important for building and maintaining peak bone mass. Weight-bearing activities cause muscles and bones to work against gravity. Strength-building activities increases muscle strength that supports bones. Weight-bearing and muscle-building activities include:  Walking and hiking.  Jogging and running.  Dancing.  Gym exercises.  Lifting weights.  Tennis and racquetball.  Climbing stairs.  Aerobics.  Adults should get at least 30 minutes of moderate physical activity on most days. Children should get at least 60 minutes of moderate physical activity on most days. Ask your health care provide what type of exercise is best for you. Where can I find more information? For more information, check out the following websites:  [place]: YardHomes.se  [employer] of Health: http://www.niams.AnonymousEar.fr.asp  This information is not intended to replace advice given to you by your health care provider. Make sure you discuss any questions you have with your health care provider. Document Released: 05/07/2003 Document Revised: 09/04/2015 Document Reviewed: 02/19/2014 Elsevier Interactive Patient Education  2018 [place]. [place] Boys M.D.

## 2017-05-03 ENCOUNTER — Ambulatory Visit (INDEPENDENT_AMBULATORY_CARE_PROVIDER_SITE_OTHER): Payer: Medicare Other | Admitting: Internal Medicine

## 2017-05-03 ENCOUNTER — Encounter: Payer: Self-pay | Admitting: Internal Medicine

## 2017-05-03 ENCOUNTER — Other Ambulatory Visit: Payer: Self-pay | Admitting: Internal Medicine

## 2017-05-03 VITALS — BP 102/60 | HR 72 | Temp 97.9°F | Ht 63.5 in | Wt 143.8 lb

## 2017-05-03 DIAGNOSIS — E2839 Other primary ovarian failure: Secondary | ICD-10-CM | POA: Diagnosis not present

## 2017-05-03 DIAGNOSIS — E7800 Pure hypercholesterolemia, unspecified: Secondary | ICD-10-CM | POA: Diagnosis not present

## 2017-05-03 DIAGNOSIS — I10 Essential (primary) hypertension: Secondary | ICD-10-CM | POA: Diagnosis not present

## 2017-05-03 DIAGNOSIS — R000 Tachycardia, unspecified: Secondary | ICD-10-CM

## 2017-05-03 DIAGNOSIS — Z79899 Other long term (current) drug therapy: Secondary | ICD-10-CM | POA: Diagnosis not present

## 2017-05-03 DIAGNOSIS — F411 Generalized anxiety disorder: Secondary | ICD-10-CM

## 2017-05-03 DIAGNOSIS — Z139 Encounter for screening, unspecified: Secondary | ICD-10-CM

## 2017-05-03 DIAGNOSIS — M810 Age-related osteoporosis without current pathological fracture: Secondary | ICD-10-CM | POA: Diagnosis not present

## 2017-05-03 NOTE — Patient Instructions (Addendum)
Advise updated bone density .   Will put in order and you get appt.,  Can do a vit  D leve with  Blood test.   See cardiology about the episode of 150 HR  .   Will notify you  of labs when available. Fu depending on labs and   How  You are doing  Or 6 months  Can get shingrix vaccine    In future      Bone Health Bones protect organs, store calcium, and anchor muscles. Good health habits, such as eating nutritious foods and exercising regularly, are important for maintaining healthy bones. They can also help to prevent a condition that causes bones to lose density and become weak and brittle (osteoporosis). Why is bone mass important? Bone mass refers to the amount of bone tissue that you have. The higher your bone mass, the stronger your bones. An important step toward having healthy bones throughout life is to have strong and dense bones during childhood. A young adult who has a high bone mass is more likely to have a high bone mass later in life. Bone mass at its greatest it is called peak bone mass. A large decline in bone mass occurs in older adults. In women, it occurs about the time of menopause. During this time, it is important to practice good health habits, because if more bone is lost than what is replaced, the bones will become less healthy and more likely to break (fracture). If you find that you have a low bone mass, you may be able to prevent osteoporosis or further bone loss by changing your diet and lifestyle. How can I find out if my bone mass is low? Bone mass can be measured with an X-ray test that is called a bone mineral density (BMD) test. This test is recommended for all women who are age 23 or older. It may also be recommended for men who are age 68 or older, or for people who are more likely to develop osteoporosis due to:  Having bones that break easily.  Having a long-term disease that weakens bones, such as kidney disease or rheumatoid arthritis.  Having  menopause earlier than normal.  Taking medicine that weakens bones, such as steroids, thyroid hormones, or hormone treatment for breast cancer or prostate cancer.  Smoking.  Drinking three or more alcoholic drinks each day.  What are the nutritional recommendations for healthy bones? To have healthy bones, you need to get enough of the right minerals and vitamins. Most nutrition experts recommend getting these nutrients from the foods that you eat. Nutritional recommendations vary from person to person. Ask your health care provider what is healthy for you. Here are some general guidelines. Calcium Recommendations Calcium is the most important (essential) mineral for bone health. Most people can get enough calcium from their diet, but supplements may be recommended for people who are at risk for osteoporosis. Good sources of calcium include:  Dairy products, such as low-fat or nonfat milk, cheese, and yogurt.  Dark green leafy vegetables, such as bok choy and broccoli.  Calcium-fortified foods, such as orange juice, cereal, bread, soy beverages, and tofu products.  Nuts, such as almonds.  Follow these recommended amounts for daily calcium intake:  Children, age 24?3: 700 mg.  Children, age 20?8: 1,000 mg.  Children, age 37?13: 1,300 mg.  Teens, age 55?18: 1,300 mg.  Adults, age 202?50: 1,000 mg.  Adults, age 80?70: ? Men: 1,000 mg. ? Women: 1,200 mg.  Adults, age 106 or older: 1,200 mg.  Pregnant and breastfeeding females: ? Teens: 1,300 mg. ? Adults: 1,000 mg.  Vitamin D Recommendations Vitamin D is the most essential vitamin for bone health. It helps the body to absorb calcium. Sunlight stimulates the skin to make vitamin D, so be sure to get enough sunlight. If you live in a cold climate or you do not get outside often, your health care provider may recommend that you take vitamin D supplements. Good sources of vitamin D in your diet include:  Egg yolks.  Saltwater  fish.  Milk and cereal fortified with vitamin D.  Follow these recommended amounts for daily vitamin D intake:  Children and teens, age 1?18: 37 international units.  Adults, age 59 or younger: 400-800 international units.  Adults, age 41 or older: 800-1,000 international units.  Other Nutrients Other nutrients for bone health include:  Phosphorus. This mineral is found in meat, poultry, dairy foods, nuts, and legumes. The recommended daily intake for adult men and adult women is 700 mg.  Magnesium. This mineral is found in seeds, nuts, dark green vegetables, and legumes. The recommended daily intake for adult men is 400?420 mg. For adult women, it is 310?320 mg.  Vitamin K. This vitamin is found in green leafy vegetables. The recommended daily intake is 120 mg for adult men and 90 mg for adult women.  What type of physical activity is best for building and maintaining healthy bones? Weight-bearing and strength-building activities are important for building and maintaining peak bone mass. Weight-bearing activities cause muscles and bones to work against gravity. Strength-building activities increases muscle strength that supports bones. Weight-bearing and muscle-building activities include:  Walking and hiking.  Jogging and running.  Dancing.  Gym exercises.  Lifting weights.  Tennis and racquetball.  Climbing stairs.  Aerobics.  Adults should get at least 30 minutes of moderate physical activity on most days. Children should get at least 60 minutes of moderate physical activity on most days. Ask your health care provide what type of exercise is best for you. Where can I find more information? For more information, check out the following websites:  [place]: YardHomes.se  [employer] of Health: http://www.niams.AnonymousEar.fr.asp  This information is not intended to replace  advice given to you by your health care provider. Make sure you discuss any questions you have with your health care provider. Document Released: 05/07/2003 Document Revised: 09/04/2015 Document Reviewed: 02/19/2014 Elsevier Interactive Patient Education  [employer].

## 2017-05-04 LAB — T4, FREE: Free T4: 0.93 ng/dL (ref 0.60–1.60)

## 2017-05-04 LAB — CBC WITH DIFFERENTIAL/PLATELET
BASOS ABS: 0 10*3/uL (ref 0.0–0.1)
Basophils Relative: 0.7 % (ref 0.0–3.0)
EOS ABS: 0.2 10*3/uL (ref 0.0–0.7)
Eosinophils Relative: 3.6 % (ref 0.0–5.0)
HCT: 36.1 % (ref 36.0–46.0)
Hemoglobin: 12.4 g/dL (ref 12.0–15.0)
LYMPHS ABS: 1.9 10*3/uL (ref 0.7–4.0)
Lymphocytes Relative: 32.7 % (ref 12.0–46.0)
MCHC: 34.3 g/dL (ref 30.0–36.0)
MCV: 92.2 fl (ref 78.0–100.0)
MONO ABS: 0.6 10*3/uL (ref 0.1–1.0)
MONOS PCT: 10.7 % (ref 3.0–12.0)
NEUTROS ABS: 3.1 10*3/uL (ref 1.4–7.7)
NEUTROS PCT: 52.3 % (ref 43.0–77.0)
PLATELETS: 296 10*3/uL (ref 150.0–400.0)
RBC: 3.91 Mil/uL (ref 3.87–5.11)
RDW: 13.4 % (ref 11.5–15.5)
WBC: 5.9 10*3/uL (ref 4.0–10.5)

## 2017-05-04 LAB — LIPID PANEL
CHOL/HDL RATIO: 3
CHOLESTEROL: 146 mg/dL (ref 0–200)
HDL: 42.3 mg/dL (ref >39.00)
LDL CALC: 78 mg/dL (ref 0–99)
NONHDL: 103.73
TRIGLYCERIDES: 129 mg/dL (ref 0.0–149.0)
VLDL: 25.8 mg/dL (ref 0.0–40.0)

## 2017-05-04 LAB — TSH: TSH: 3.2 u[IU]/mL (ref 0.35–4.50)

## 2017-05-04 LAB — VITAMIN D 25 HYDROXY (VIT D DEFICIENCY, FRACTURES): VITD: 13.54 ng/mL — AB (ref 30.00–100.00)

## 2017-05-05 NOTE — Telephone Encounter (Signed)
Pt was seen by Dr Regis Bill 3/6

## 2017-05-08 ENCOUNTER — Other Ambulatory Visit: Payer: Self-pay | Admitting: Internal Medicine

## 2017-05-08 ENCOUNTER — Other Ambulatory Visit: Payer: Self-pay | Admitting: Cardiology

## 2017-05-09 ENCOUNTER — Other Ambulatory Visit: Payer: Self-pay | Admitting: Cardiology

## 2017-05-10 NOTE — Telephone Encounter (Signed)
Medication Detail    Disp Refills Start End   metoprolol succinate (TOPROL-XL) 25 MG 24 hr tablet 30 tablet 0 05/09/2017    Sig - Route: Take 1 tablet (25 mg total) by mouth 2 (two) times daily. - Oral   Sent to pharmacy as: metoprolol succinate (TOPROL-XL) 25 MG 24 hr tablet   Notes to Pharmacy: Please call the office at [phone] to set up yearly OV for further refills. Thank you. 2nd attempt.   E-Prescribing Status: Receipt confirmed by pharmacy (05/09/2017 5:16 PM EDT)   Pharmacy   [id number] - [place], [place]

## 2017-05-25 ENCOUNTER — Other Ambulatory Visit: Payer: Self-pay | Admitting: Internal Medicine

## 2017-05-25 DIAGNOSIS — E559 Vitamin D deficiency, unspecified: Secondary | ICD-10-CM

## 2017-06-06 ENCOUNTER — Ambulatory Visit
Admission: RE | Admit: 2017-06-06 | Discharge: 2017-06-06 | Disposition: A | Discharge status: Home / Self Care | Payer: Medicare Other | Source: Ambulatory Visit | Attending: Internal Medicine | Admitting: Internal Medicine

## 2017-06-06 DIAGNOSIS — Z139 Encounter for screening, unspecified: Secondary | ICD-10-CM

## 2017-06-06 DIAGNOSIS — Z1231 Encounter for screening mammogram for malignant neoplasm of breast: Secondary | ICD-10-CM | POA: Diagnosis not present

## 2017-06-13 ENCOUNTER — Telehealth: Payer: Self-pay | Admitting: Cardiology

## 2017-06-13 NOTE — Telephone Encounter (Signed)
New Message   *STAT* If patient is at the pharmacy, call can be transferred to refill team.   1. Which medications need to be refilled? (please list name of each medication and dose if known) metoprolol succinate (TOPROL-XL) 25 MG 24 hr tablet  2. Which pharmacy/location (including street and city if local pharmacy) is medication to be sent to? [id number] - [place], [place]  3. Do they need a 30 day or 90 day supply? [place]

## 2017-06-13 NOTE — Telephone Encounter (Signed)
Last office visit indicates that patient will f/u prn with Dr Marlou Porch. She scheduled an appointment but this is most likely due to this medication be refilled with a msg that the patient needs to call and schedule an appointment for further refills when it should have been questioned and possibly deferred to her pcp. If okay to refill should it be ordered for qd prn or bid prn? Please advise. Thanks, MI

## 2017-06-14 NOTE — Telephone Encounter (Signed)
She has an appointment scheduled here this Friday. Okay to approve one month refill? Should it be ordered for qd prn or bid prn? Please advise. Thanks, MI

## 2017-06-14 NOTE — Telephone Encounter (Signed)
OK X 1 month, as RXed - BID prn please.

## 2017-06-14 NOTE — Telephone Encounter (Signed)
Please have PCP to refill if possible, if not pt will need an appt to obtain further refills here.  Thank you

## 2017-06-16 ENCOUNTER — Ambulatory Visit (INDEPENDENT_AMBULATORY_CARE_PROVIDER_SITE_OTHER): Payer: Medicare Other | Admitting: Cardiology

## 2017-06-16 ENCOUNTER — Encounter: Payer: Self-pay | Admitting: Cardiology

## 2017-06-16 VITALS — BP 124/66 | HR 71 | Ht 64.0 in | Wt 140.8 lb

## 2017-06-16 DIAGNOSIS — R002 Palpitations: Secondary | ICD-10-CM

## 2017-06-16 MED ORDER — METOPROLOL SUCCINATE ER 25 MG PO TB24: 25.0000 mg | ORAL_TABLET | Freq: Every day | ORAL | 3 refills | Status: DC

## 2017-06-16 NOTE — Progress Notes (Signed)
Nicole Wood. [address] [place], [place]  [address] Phone: [phone] Fax:  [phone]  Date:  06/16/2017   ID:  Nicole Wood, DOB [birth year], MRN [id number]  PCP:  Nicole Medin, MD   History of Present Illness: Nicole Wood is a 72 y.o. female here for follow-up of elevated heart rate at the request of Dr. Regis Wood.  In review of prior note: Has history of palpitations. Woke up with HR 137 BPM. ?Event monitor.  He describes it as "jumpy heart". She was seen in the emergency department. Labile blood pressure. Has been under increased stress over summer 2015  In Feb 2015, Husband and she went to [facility] for 3 weeks. Had 2 episodes of palpitaions. Mon, Fri. Each time ate grouper. First time while at hotel before sleep, relaxing, felt pounding heart. Feels like she has to get up and walk around. Lasted 3-5 min. Took one of husband's Xanax (On CHEMO). Did fine all week.  On Friday, evening, 7pm went to dinner, grouper sandwich. Glass of wine prior to leaving. Came home, felt very miserable and full. Got in bed, took TUMS, bloated. No Xanax that time. Breathing was fine. At 2am heart bolted her out bed. Walked around, took deep breath, no CP, no diaphoesis. Did not wake Nicole Wood her husband up right away. Took Xanax. Scared. Calmed down. Finally went to sleep. Anxious.   Left Saturday went to [place]. Her husband wanted her to go to ER. 2 hr drive. Checked into hotel, heart started racing. Went to ER. HR was 133bpm. Tele. Looked good. Blood work and CXR OK. Grandson mental break.   ER MD talked about toxins in fish.   04/01/14-follow-up visit-she's been doing very well without palpitations. She is eager to start exercising once again. No chest pain, no shortness of breath. She has metoprolol to be taken on as-needed basis.  04/07/16 - woke up flush in the chest, heart racing. EMS called 137. Doing it early in AM. 100 bpm Ativan helps. No syncope, no bleeding, no fevers, no  orthopnea.  06/16/2017 - 02/28/17 - broke foot. 2/19 - palps, all over the place, BP cuff 150, but went down in 5-8 min. Takes metoprolol at bedtime because wakes her up sometimes. Deep breaths help. Not SOB, CP. Rushing in ear. Seeing nutrition. Bike at [employer].    Wt Readings from Last 3 Encounters:  06/16/17 140 lb 12.8 oz (63.9 kg)  05/03/17 143 lb 12.8 oz (65.2 kg)  02/22/17 146 lb (66.2 kg)     Past Medical History:  Diagnosis Date  . Basal cell Wood    nose  . Cystocele   . Depression    in the past  . Hematuria    idiopathic  . Osteoarthritis   . Osteopenia    dexa 2009, -2.3 osteoporosis -2.5 hip 2011  . Urinary incontinence     Past Surgical History:  Procedure Laterality Date  . ABDOMINAL HYSTERECTOMY     fibroids and cyst right ovary left oopherectomy  . basal cell ca removed    . bladder tack    . BREAST BIOPSY Left    benign  . rt shoulder surgery   2004   [place]  . URETHRAL DILATION      Current Outpatient Medications  Medication Sig Dispense Refill  . bifidobacterium infantis (ALIGN) capsule Take 1 capsule by mouth daily.    . Cholecalciferol (VITAMIN D3) 5000 units CAPS Take 1 capsule by  mouth daily.    . citalopram (CELEXA) 10 MG tablet TAKE 1 TABLET BY MOUTH TWICE DAILY 180 tablet 0  . cyclobenzaprine (FLEXERIL) 10 MG tablet Take 5 mg by mouth at bedtime as needed (TMJ).     . LORazepam (ATIVAN) 0.5 MG tablet TAKE 1 TABLET(0.5 MG) BY MOUTH EVERY 8 HOURS AS NEEDED 24 tablet 0  . losartan (COZAAR) 50 MG tablet TAKE 1 TABLET(50 MG) BY MOUTH DAILY 90 tablet 1  . metoprolol succinate (TOPROL-XL) 25 MG 24 hr tablet Take 1 tablet (25 mg total) by mouth 2 (two) times daily. 30 tablet 0  . Multiple Vitamins-Minerals (ICAPS PO) Take 1 capsule by mouth 2 (two) times daily.     . naproxen sodium (ANAPROX) 220 MG tablet Take 220 mg by mouth 2 (two) times daily as needed (pain). ALEVE    . polyethylene glycol (MIRALAX / GLYCOLAX) packet Take 8.5 g by mouth  daily.     No current facility-administered medications for this visit.     Allergies:    Allergies  Allergen Reactions  . Contrast Media [Iodinated Diagnostic Agents] Other (See Comments)    Gums got numb after a procedure  . Lisinopril Cough    Ok on  losartan  . Penicillins Rash    Social History:  The patient  reports that she has never smoked. She has never used smokeless tobacco. She reports that she drinks alcohol. She reports that she does not use drugs.   Family History  Problem Relation Age of Onset  . Pulmonary embolism Mother   . Depression Mother        major depressive disorder  . Hip fracture Mother        about 49   . Hypertension Mother   . Wood Father        bladder  . Diabetes Brother   . Hyperlipidemia Brother   . Arthritis Brother        DJD  . Arthritis Brother   . Stroke Maternal Aunt   . Breast Wood Maternal Aunt   . Heart attack Neg Hx     ROS:  Please see the history of present illness.   All other review of systems negative.  PHYSICAL EXAM: VS:  BP 124/66   Pulse 71   Ht 5\' 4"  (1.626 m)   Wt 140 lb 12.8 oz (63.9 kg)   LMP  (LMP Unknown)   BMI 24.17 kg/m  GEN: Well nourished, well developed, in no acute distress  HEENT: normal  Neck: no JVD, carotid bruits, or masses Cardiac: RRR; no murmurs, rubs, or gallops,no edema  Respiratory:  clear to auscultation bilaterally, normal work of breathing GI: soft, nontender, nondistended, + BS MS: no deformity or atrophy  Skin: warm and dry, no rash Neuro:  Alert and Oriented x 3, Strength and sensation are intact Psych: euthymic mood, full affect    EKG: 06/16/2017-sinus rhythm 71 with no other abnormalities personally viewed-prior 02/11/14-normal sinus rhythm, 96, no other abnormalities     ASSESSMENT AND PLAN:  Palpitations - We will increase her Toprol-XL to 50 mg in the evening hours.  She has been taking 25 every evening.  Hopefully this will help suppress some of these  arrhythmias. -Event monitor on 04/11/2016 was reassuring.  No atrial fibrillation.  Signed, Candee Furbish, MD [facility]  06/16/2017 8:51 AM

## 2017-06-16 NOTE — Patient Instructions (Signed)
Medication Instructions:  Please take your Metoprolol at bedtime. Continue all other medications.  Follow-Up: Follow up in 1 year with Dr. Marlou Porch.  You will receive a letter in the mail 2 months before you are due.  Please call us when you receive this letter to schedule your follow up appointment.  If you need a refill on your cardiac medications before your next appointment, please call your pharmacy.  Thank you for choosing [facility]!!

## 2017-06-21 ENCOUNTER — Other Ambulatory Visit: Payer: Self-pay | Admitting: Internal Medicine

## 2017-07-31 ENCOUNTER — Telehealth: Payer: Self-pay

## 2017-07-31 NOTE — Telephone Encounter (Signed)
Pt states she is taking her Metoprolol 25 mg twice a day on her current medication list it states that she is only taking it once a day, Please advise.

## 2017-07-31 NOTE — Telephone Encounter (Signed)
In review of Dr Marlou Porch last office visit note - pt was to increase Metoprolol to 50 mg in the evening.  Medication list does read 25 mg at bedtime.  Will send new RX into pharmacy however she has 3 pharmacies listed and will need to know which one to send the RX in to.  LM to CB to discuss.

## 2017-08-01 NOTE — Telephone Encounter (Signed)
Left another message for pt to c/b to discuss.  Metoprolol should be 50 mg QHS but is she taking it 25 mg BID and what pharmacy does she want the RX to go to?

## 2017-08-02 ENCOUNTER — Other Ambulatory Visit: Payer: Self-pay | Admitting: *Deleted

## 2017-08-02 MED ORDER — METOPROLOL SUCCINATE ER 50 MG PO TB24: 50.0000 mg | ORAL_TABLET | Freq: Every day | ORAL | 2 refills | Status: DC

## 2017-08-02 NOTE — Telephone Encounter (Signed)
Tried several times to reach patient today but her phone was turned off. It is noted that she is out of medication therefore I will go ahead and send in the correct rx with a note to the pharmacy to please make sure patient is aware of change in mg of tablet.

## 2017-08-02 NOTE — Telephone Encounter (Signed)
Called patient but she has her phone turned off. Will try again later.

## 2017-08-02 NOTE — Telephone Encounter (Signed)
Follow up          She is out of medication and needs it called in asap    *STAT* If patient is at the pharmacy, call can be transferred to refill team.   1. Which medications need to be refilled? (please list name of each medication and dose if known)Metoprolol  Needs new prescription for the 50 mg because she is taking 2 pills (25mg ) at bedtime   2. Which pharmacy/location (including street and city if local pharmacy) is medication to be sent to? Patient wants prescription sent to [facility] at [address]   3. Do they need a 30 day or 90 day supply?  90 day

## 2017-08-09 DIAGNOSIS — H353131 Nonexudative age-related macular degeneration, bilateral, early dry stage: Secondary | ICD-10-CM | POA: Diagnosis not present

## 2017-08-09 DIAGNOSIS — H2513 Age-related nuclear cataract, bilateral: Secondary | ICD-10-CM | POA: Diagnosis not present

## 2017-08-09 DIAGNOSIS — H5203 Hypermetropia, bilateral: Secondary | ICD-10-CM | POA: Diagnosis not present

## 2017-10-24 ENCOUNTER — Other Ambulatory Visit: Payer: Self-pay | Admitting: Internal Medicine

## 2017-10-24 NOTE — Telephone Encounter (Signed)
Dr. Regis Bill please advise on the refill of the citalopram.  Thanks    06/21/2017 for #90 with no refills.  05/03/2017 was the last OV for this pt

## 2017-10-25 DIAGNOSIS — D225 Melanocytic nevi of trunk: Secondary | ICD-10-CM | POA: Diagnosis not present

## 2017-10-25 DIAGNOSIS — L821 Other seborrheic keratosis: Secondary | ICD-10-CM | POA: Diagnosis not present

## 2017-10-25 DIAGNOSIS — D1801 Hemangioma of skin and subcutaneous tissue: Secondary | ICD-10-CM | POA: Diagnosis not present

## 2017-10-25 DIAGNOSIS — L814 Other melanin hyperpigmentation: Secondary | ICD-10-CM | POA: Diagnosis not present

## 2017-10-25 DIAGNOSIS — Z85828 Personal history of other malignant neoplasm of skin: Secondary | ICD-10-CM | POA: Diagnosis not present

## 2017-10-25 DIAGNOSIS — L819 Disorder of pigmentation, unspecified: Secondary | ICD-10-CM | POA: Diagnosis not present

## 2017-12-25 ENCOUNTER — Ambulatory Visit: Payer: Medicare Other

## 2017-12-27 ENCOUNTER — Ambulatory Visit (INDEPENDENT_AMBULATORY_CARE_PROVIDER_SITE_OTHER): Payer: Medicare Other

## 2017-12-27 DIAGNOSIS — Z23 Encounter for immunization: Secondary | ICD-10-CM | POA: Diagnosis not present

## 2018-01-24 ENCOUNTER — Ambulatory Visit (INDEPENDENT_AMBULATORY_CARE_PROVIDER_SITE_OTHER): Payer: Medicare Other | Admitting: Family Medicine

## 2018-01-24 ENCOUNTER — Other Ambulatory Visit: Payer: Self-pay | Admitting: Internal Medicine

## 2018-01-24 ENCOUNTER — Encounter: Payer: Self-pay | Admitting: Family Medicine

## 2018-01-24 ENCOUNTER — Ambulatory Visit (INDEPENDENT_AMBULATORY_CARE_PROVIDER_SITE_OTHER): Payer: Medicare Other

## 2018-01-24 VITALS — BP 118/76 | HR 79 | Temp 98.3°F | Resp 16 | Ht 64.0 in | Wt 141.5 lb

## 2018-01-24 DIAGNOSIS — R059 Cough, unspecified: Secondary | ICD-10-CM

## 2018-01-24 DIAGNOSIS — R05 Cough: Secondary | ICD-10-CM | POA: Diagnosis not present

## 2018-01-24 DIAGNOSIS — J069 Acute upper respiratory infection, unspecified: Secondary | ICD-10-CM | POA: Diagnosis not present

## 2018-01-24 MED ORDER — BENZONATATE 100 MG PO CAPS: 200.0000 mg | ORAL_CAPSULE | Freq: Two times a day (BID) | ORAL | 0 refills | Status: AC | PRN

## 2018-01-24 NOTE — Patient Instructions (Addendum)
  [name] I have seen you today for an acute visit.  A few things to remember from today's visit:   Cough - Plan: DG Chest 2 View, benzonatate (TESSALON) 100 MG capsule  Viral upper respiratory tract infection   viral infections are self-limited and we treat each symptom depending of severity.  Over the counter medications as decongestants and cold medications usually help, they need to be taken with caution if there is a history of high blood pressure or palpitations. Tylenol and/or Ibuprofen also helps with most symptoms (headache, muscle aching, fever,etc) Plenty of fluids. Honey helps with cough. Steam inhalations helps with runny nose, nasal congestion, and may prevent sinus infections. Cough and nasal congestion could last a few days and sometimes weeks. Please follow in not any better in 1-2 weeks or if symptoms get worse.      In general please monitor for signs of worsening symptoms and seek immediate medical attention if any concerning.  If symptoms are not resolved in 1-2 you should schedule a follow up appointment with your doctor, before if needed.  I hope you get better soon!

## 2018-01-24 NOTE — Progress Notes (Signed)
ACUTE VISIT  HPI:  Chief Complaint  Patient presents with  . Cough    sx started last week  . Nasal Congestion  . Headache    [name] is a 72 y.o.female here today complaining of around 7 days of respiratory symptoms.  "Deep cough", occasionally productive with whitish/clear sputum. Cough seems to be exacerbated by talking. She denies dysphasia or dysphonia. Today she has "a little bit" sore throat, which she attributes to postnasal drainage.  Bitemporal pressure headache, exacerbated by coughing spells.  Last week she was having some nausea in the morning. She denies vomiting, abdominal pain, or changes in bowel habits.  She has had some fatigue, denies chills or fever. She also denies chest pain, wheezing, or shortness of breath. Problem seems to be stable.  Cough  The current episode started in the past 7 days. The problem has been unchanged. The cough is productive of sputum. Associated symptoms include headaches, nasal congestion, postnasal drip, rhinorrhea and a sore throat. Pertinent negatives include no chills, ear pain, eye redness, fever, heartburn, hemoptysis, myalgias, rash, shortness of breath or wheezing. The symptoms are aggravated by other. She has tried OTC cough suppressant for the symptoms. The treatment provided mild relief. There is no history of asthma or environmental allergies.    No Hx of recent travel. No sick contact. No known insect bite.  OTC medications for this problem: Ibuprofen and Mucinex.   Review of Systems  Constitutional: Positive for appetite change and fatigue. Negative for activity change, chills and fever.  HENT: Positive for congestion, postnasal drip, rhinorrhea and sore throat. Negative for ear pain, mouth sores, sinus pressure, trouble swallowing and voice change.   Eyes: Negative for discharge, redness and itching.  Respiratory: Positive for cough. Negative for hemoptysis, shortness of breath and wheezing.    Gastrointestinal: Negative for abdominal pain, diarrhea, heartburn and vomiting.  Musculoskeletal: Negative for gait problem and myalgias.  Skin: Negative for rash.  Allergic/Immunologic: Negative for environmental allergies.  Neurological: Positive for headaches. Negative for syncope and weakness.      Current Outpatient Medications on File Prior to Visit  Medication Sig Dispense Refill  . bifidobacterium infantis (ALIGN) capsule Take 1 capsule by mouth daily.    . Cholecalciferol (VITAMIN D3) 5000 units CAPS Take 1 capsule by mouth daily.    . citalopram (CELEXA) 10 MG tablet TAKE 1 TABLET BY MOUTH TWICE DAILY 180 tablet 0  . cyclobenzaprine (FLEXERIL) 10 MG tablet Take 5 mg by mouth at bedtime as needed (TMJ).     . LORazepam (ATIVAN) 0.5 MG tablet TAKE 1 TABLET(0.5 MG) BY MOUTH EVERY 8 HOURS AS NEEDED 24 tablet 0  . losartan (COZAAR) 50 MG tablet TAKE 1 TABLET(50 MG) BY MOUTH DAILY 90 tablet 0  . metoprolol succinate (TOPROL-XL) 50 MG 24 hr tablet Take 1 tablet (50 mg total) by mouth at bedtime. PLEASE NOTE DOSE CHANGE 90 tablet 2  . Multiple Vitamins-Minerals (ICAPS PO) Take 1 capsule by mouth 2 (two) times daily.     . naproxen sodium (ANAPROX) 220 MG tablet Take 220 mg by mouth 2 (two) times daily as needed (pain). ALEVE    . polyethylene glycol (MIRALAX / GLYCOLAX) packet Take 8.5 g by mouth daily.     No current facility-administered medications on file prior to visit.      Past Medical History:  Diagnosis Date  . Basal cell cancer    nose  . Cystocele   .  Depression    in the past  . Hematuria    idiopathic  . Osteoarthritis   . Osteopenia    dexa 2009, -2.3 osteoporosis -2.5 hip 2011  . Urinary incontinence    Allergies  Allergen Reactions  . Contrast Media [Iodinated Diagnostic Agents] Other (See Comments)    Gums got numb after a procedure  . Lisinopril Cough    Ok on  losartan  . Penicillins Rash    Social History   Socioeconomic History  . Marital  status: Married    Spouse name: Not on file  . Number of children: Not on file  . Years of education: Not on file  . Highest education level: Not on file  Occupational History  . Not on file  Social Needs  . Financial resource strain: Not on file  . Food insecurity:    Worry: Not on file    Inability: Not on file  . Transportation needs:    Medical: Not on file    Non-medical: Not on file  Tobacco Use  . Smoking status: Never Smoker  . Smokeless tobacco: Never Used  Substance and Sexual Activity  . Alcohol use: Yes  . Drug use: No  . Sexual activity: Yes    Partners: Female  Lifestyle  . Physical activity:    Days per week: Not on file    Minutes per session: Not on file  . Stress: Not on file  Relationships  . Social connections:    Talks on phone: Not on file    Gets together: Not on file    Attends religious service: Not on file    Active member of club or organization: Not on file    Attends meetings of clubs or organizations: Not on file    Relationship status: Not on file  Other Topics Concern  . Not on file  Social History Narrative   Retired Arts development officer) some college   Married 4 children [place],, [place], and [place], and [place] Husband cancer MM. NHL in remission   Regular exercise- yes   HH of 2   No pets    Multiple myeloma and non hogkins lymphoma in husband x 6 years   Regular exercise: walk, swim, some biking   Caffeine use: coffee daily, sweet tea    Vitals:   01/24/18 1121  BP: 118/76  Pulse: 79  Resp: 16  Temp: 98.3 F (36.8 C)  SpO2: 98%   Body mass index is 24.29 kg/m.   Physical Exam  Nursing note and vitals reviewed. Constitutional: She is oriented to person, place, and time. She appears well-developed and well-nourished. She does not appear ill. No distress.  HENT:  Head: Normocephalic and atraumatic.  Right Ear: Tympanic membrane, external ear and ear canal normal.  Left Ear: Tympanic membrane, external ear and ear canal  normal.  Nose: Rhinorrhea present. Right sinus exhibits no maxillary sinus tenderness and no frontal sinus tenderness. Left sinus exhibits no maxillary sinus tenderness and no frontal sinus tenderness.  Mouth/Throat: Oropharynx is clear and moist and mucous membranes are normal.  Postnasal drainage. Mouth breathing.  Eyes: Conjunctivae are normal.  Neck: No muscular tenderness present. No edema and no erythema present.  Cardiovascular: Normal rate and regular rhythm.  No murmur heard. Respiratory: Effort normal and breath sounds normal. No respiratory distress.  Lymphadenopathy:       Head (right side): No submandibular adenopathy present.       Head (left side): No submandibular adenopathy present.  She has no cervical adenopathy.  Neurological: She is alert and oriented to person, place, and time. She has normal strength. Gait normal.  Skin: Skin is warm. No rash noted. No erythema.  Psychiatric: She has a normal mood and affect.  Well groomed, good eye contact.      ASSESSMENT AND PLAN:  Ms. Madisen was seen today for cough, nasal congestion and headache.  Diagnoses and all orders for this visit:  Cough Lung auscultation negative. Explained that cough and congestion can last a few days and even weeks after URI.  Chest imaging was ordered today, further recommendation will be given according to imaging results. Adequate hydration and benzonatate may help with cough. Instructed about warning signs.  -     DG Chest 2 View; Future -     benzonatate (TESSALON) 100 MG capsule; Take 2 capsules (200 mg total) by mouth 2 (two) times daily as needed for up to 10 days.  Viral upper respiratory tract infection Symptomatic treatment recommended for now. Recommend continuing plain Mucinex. Monitor for fever. Follow-up with PCP as needed.     Betty G. Martinique, MD  [facility]. [place] office.

## 2018-01-29 ENCOUNTER — Encounter: Payer: Self-pay | Admitting: Family Medicine

## 2018-01-30 ENCOUNTER — Ambulatory Visit (INDEPENDENT_AMBULATORY_CARE_PROVIDER_SITE_OTHER): Payer: Medicare Other | Admitting: Family Medicine

## 2018-01-30 ENCOUNTER — Encounter: Payer: Self-pay | Admitting: Family Medicine

## 2018-01-30 VITALS — BP 128/80 | HR 94 | Temp 98.9°F | Wt 140.1 lb

## 2018-01-30 DIAGNOSIS — J209 Acute bronchitis, unspecified: Secondary | ICD-10-CM

## 2018-01-30 MED ORDER — AZITHROMYCIN 250 MG PO TABS: ORAL_TABLET | ORAL | 0 refills | Status: DC

## 2018-01-30 MED ORDER — HYDROCODONE-HOMATROPINE 5-1.5 MG/5ML PO SYRP: 5.0000 mL | ORAL_SOLUTION | ORAL | 0 refills | Status: DC | PRN

## 2018-01-30 MED ORDER — METHYLPREDNISOLONE ACETATE 80 MG/ML IJ SUSP
120.0000 mg | Freq: Once | INTRAMUSCULAR | Status: AC
  Administered 2018-01-30: 120 mg via INTRAMUSCULAR

## 2018-01-30 NOTE — Progress Notes (Signed)
   Subjective:    Patient ID: Nicole Wood, female    DOB: 07/01/1945, 72 y.o.   MRN: [id number]  HPI Here for a cough that started about 3 weeks ago. This is mostly non-productive. No sinus congestion or PND. No fever. Using Benzonatate with mixed results.    Review of Systems  Constitutional: Negative.   HENT: Negative.   Eyes: Negative.   Respiratory: Positive for cough.        Objective:   Physical Exam  Constitutional: She appears well-developed and well-nourished.  HENT:  Right Ear: External ear normal.  Left Ear: External ear normal.  Nose: Nose normal.  Mouth/Throat: Oropharynx is clear and moist.  Eyes: Conjunctivae are normal.  Neck: No thyromegaly present.  Pulmonary/Chest: Effort normal and breath sounds normal. No stridor. No respiratory distress. She has no wheezes. She has no rales.  Lymphadenopathy:    She has no cervical adenopathy.          Assessment & Plan:  Atypical bronchitis, treat with a Depomedrol shot and a Zpack.  Alysia Penna, MD

## 2018-01-30 NOTE — Addendum Note (Signed)
Addended by: Elie Confer on: 01/30/2018 04:55 PM   Modules accepted: Orders

## 2018-02-02 ENCOUNTER — Encounter: Payer: Self-pay | Admitting: Family Medicine

## 2018-02-02 ENCOUNTER — Ambulatory Visit (INDEPENDENT_AMBULATORY_CARE_PROVIDER_SITE_OTHER): Payer: Medicare Other | Admitting: Family Medicine

## 2018-02-02 VITALS — BP 130/68 | HR 86 | Temp 98.3°F | Ht 64.0 in

## 2018-02-02 DIAGNOSIS — J209 Acute bronchitis, unspecified: Secondary | ICD-10-CM | POA: Diagnosis not present

## 2018-02-02 MED ORDER — ALBUTEROL SULFATE HFA 108 (90 BASE) MCG/ACT IN AERS: 2.0000 | INHALATION_SPRAY | RESPIRATORY_TRACT | 0 refills | Status: DC | PRN

## 2018-02-02 NOTE — Progress Notes (Signed)
   Subjective:    Patient ID: Nicole Wood, female    DOB: 1945-09-11, 72 y.o.   MRN: [id number]  HPI Here to follow up from her visit here on 01-30-18. She had a cough and we gave her a steroid shot, a Zpack, and Hydromet syrup. The syrup gave her headaches so she stopped that. She feels somewhat better today with less cough and more energy. She finds herself wheezing at times. No chest pain or SOB. Drinking fluids.    Review of Systems  Constitutional: Negative.   HENT: Negative.   Eyes: Negative.   Respiratory: Positive for cough and wheezing. Negative for chest tightness and shortness of breath.   Cardiovascular: Negative.        Objective:   Physical Exam  Constitutional: She appears well-developed and well-nourished.  HENT:  Right Ear: External ear normal.  Left Ear: External ear normal.  Nose: Nose normal.  Mouth/Throat: Oropharynx is clear and moist.  Eyes: Conjunctivae are normal.  Neck: No thyromegaly present.  Cardiovascular: Normal rate, regular rhythm, normal heart sounds and intact distal pulses.  Pulmonary/Chest: Effort normal. No stridor. No respiratory distress. She has no rales.  Soft scattered wheezes  Lymphadenopathy:    She has no cervical adenopathy.          Assessment & Plan:  Partially treated bronchitis, now with some bronchospasm. She will finish up the [place]. Stop the Hydromet and go back to benzonatate to use prn. Add an Albutrol HFA  Inhaler as needed for the wheezing. Recheck prn.  Alysia Penna, MD

## 2018-02-04 ENCOUNTER — Emergency Department (HOSPITAL_COMMUNITY): Payer: Medicare Other

## 2018-02-04 ENCOUNTER — Other Ambulatory Visit: Payer: Self-pay

## 2018-02-04 ENCOUNTER — Emergency Department (HOSPITAL_COMMUNITY)
Admission: EM | Admit: 2018-02-04 | Discharge: 2018-02-04 | Disposition: A | Discharge status: Home / Self Care | Payer: Medicare Other | Attending: Emergency Medicine | Admitting: Emergency Medicine

## 2018-02-04 ENCOUNTER — Encounter (HOSPITAL_COMMUNITY): Payer: Self-pay | Admitting: Emergency Medicine

## 2018-02-04 DIAGNOSIS — R110 Nausea: Secondary | ICD-10-CM | POA: Insufficient documentation

## 2018-02-04 DIAGNOSIS — Z85828 Personal history of other malignant neoplasm of skin: Secondary | ICD-10-CM | POA: Insufficient documentation

## 2018-02-04 DIAGNOSIS — I10 Essential (primary) hypertension: Secondary | ICD-10-CM | POA: Diagnosis not present

## 2018-02-04 DIAGNOSIS — R51 Headache: Secondary | ICD-10-CM | POA: Diagnosis not present

## 2018-02-04 DIAGNOSIS — R05 Cough: Secondary | ICD-10-CM | POA: Insufficient documentation

## 2018-02-04 DIAGNOSIS — Z79899 Other long term (current) drug therapy: Secondary | ICD-10-CM | POA: Insufficient documentation

## 2018-02-04 DIAGNOSIS — R059 Cough, unspecified: Secondary | ICD-10-CM

## 2018-02-04 DIAGNOSIS — J029 Acute pharyngitis, unspecified: Secondary | ICD-10-CM | POA: Diagnosis not present

## 2018-02-04 HISTORY — DX: Essential (primary) hypertension: I10

## 2018-02-04 LAB — GROUP A STREP BY PCR: Group A Strep by PCR: NOT DETECTED

## 2018-02-04 MED ORDER — PSEUDOEPH-BROMPHEN-DM 30-2-10 MG/5ML PO SYRP: 5.0000 mL | ORAL_SOLUTION | Freq: Three times a day (TID) | ORAL | 0 refills | Status: DC | PRN

## 2018-02-04 MED ORDER — IPRATROPIUM-ALBUTEROL 0.5-2.5 (3) MG/3ML IN SOLN
3.0000 mL | Freq: Once | RESPIRATORY_TRACT | Status: AC
  Administered 2018-02-04: 3 mL via RESPIRATORY_TRACT
  Filled 2018-02-04: qty 3

## 2018-02-04 NOTE — ED Provider Notes (Signed)
[facility] EMERGENCY DEPARTMENT Provider Note   CSN: [id number] Arrival date & time: 02/04/18  2248     History   Chief Complaint Chief Complaint  Patient presents with  . Cough  . Nausea  . Headache    HPI Nicole Wood is a 72 y.o. female with history of hypertension, osteoarthritis, osteopenia, urinary incontinence presents for evaluation of acute onset, persistent cough for approximately 2.5 weeks.  She reports symptoms began on November 1 with some nausea in the next day she developed a cough that has been productive of yellow sputum.  She notes mild nasal congestion and sore throat, denies fevers or chills.  She denies significant shortness of breath but reports that she has less energy overall and feels uncomfortable taking a walk outside like she normally would when she is feeling well.  She denies any vomiting, abdominal pain, urinary symptoms.  She has been to see her PCP 3 times for this thus far and has been on prednisone, azithromycin, and Tessalon with minimal relief.  She saw her PCP 2 days ago who noted soft wheezes and discharged with a prescription for an albuterol inhaler which she has not filled yet.  The history is provided by the patient.    Past Medical History:  Diagnosis Date  . Basal cell cancer    nose  . Cystocele   . Depression    in the past  . Hematuria    idiopathic  . Hypertension   . Osteoarthritis   . Osteopenia    dexa 2009, -2.3 osteoporosis -2.5 hip 2011  . Urinary incontinence     Patient Active Problem List   Diagnosis Date Noted  . Anxiety state 01/17/2015  . Colon cancer screening 08/03/2014  . Impacted ear wax 12/08/2012  . Fam hx-osteoporosis 12/08/2012  . Pain in lower jaw 12/12/2011  . Hypertension 12/12/2011  . Stress 12/12/2011  . Palpitation ocassional 12/12/2011  . Medicare welcome visit 12/14/2010  . Screening cholesterol level 12/14/2010  . Jaw pain 12/14/2010  . DEGENERATIVE JOINT DISEASE  12/08/2009  . ELEVATED BP READING WITHOUT DX HYPERTENSION 09/08/2009  . URINARY INCONTINENCE 08/02/2009  . DEPRESSION 07/28/2009  . HEMATURIA UNSPECIFIED 07/28/2009  . BACK PAIN 07/28/2009  . 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 ABDOMINAL HYSTERECTOMY     fibroids and cyst right ovary left oopherectomy  . basal cell ca removed    . bladder tack    . BREAST BIOPSY Left    benign  . rt shoulder surgery   2004   [facility]  . URETHRAL DILATION       OB History   None      Home Medications    Prior to Admission medications   Medication Sig Start Date End Date Taking? Authorizing Provider  albuterol (PROVENTIL HFA;VENTOLIN HFA) 108 (90 Base) MCG/ACT inhaler Inhale 2 puffs into the lungs every 4 (four) hours as needed for wheezing or shortness of breath. 02/02/18   Laurey Morale, MD  azithromycin (ZITHROMAX) 250 MG tablet As directed 01/30/18   Laurey Morale, MD  bifidobacterium infantis (ALIGN) capsule Take 1 capsule by mouth daily.    [provider]  brompheniramine-pseudoephedrine-DM 30-2-10 MG/5ML syrup Take 5 mLs by mouth 3 (three) times daily as needed (cough). 02/04/18   Eddrick Dilone A, PA-C  Cholecalciferol (VITAMIN D3) 5000 units CAPS Take 1 capsule by mouth daily.    [provider]  citalopram (  CELEXA) 10 MG tablet Take 1 tablet (10 mg total) by mouth 2 (two) times daily. DUE FOR MED CHECK APPOINTMENT 01/24/18   Panosh, Standley Brooking, MD  cyclobenzaprine (FLEXERIL) 10 MG tablet Take 5 mg by mouth at bedtime as needed (TMJ).     [provider]  HYDROcodone-homatropine (HYDROMET) 5-1.5 MG/5ML syrup Take 5 mLs by mouth every 4 (four) hours as needed. 01/30/18   Laurey Morale, MD  LORazepam (ATIVAN) 0.5 MG tablet TAKE 1 TABLET(0.5 MG) BY MOUTH EVERY 8 HOURS AS NEEDED 04/13/17   Panosh, Standley Brooking, MD  losartan (COZAAR) 50 MG tablet TAKE 1 TABLET(50 MG) BY MOUTH DAILY 02/02/18   Panosh, Standley Brooking, MD    metoprolol succinate (TOPROL-XL) 50 MG 24 hr tablet Take 1 tablet (50 mg total) by mouth at bedtime. PLEASE NOTE DOSE CHANGE 08/02/17   Jerline Pain, MD  Multiple Vitamins-Minerals (ICAPS PO) Take 1 capsule by mouth 2 (two) times daily.     [provider]  naproxen sodium (ANAPROX) 220 MG tablet Take 220 mg by mouth 2 (two) times daily as needed (pain). ALEVE    [provider]  polyethylene glycol (MIRALAX / GLYCOLAX) packet Take 8.5 g by mouth daily.    [provider]    Family History Family History  Problem Relation Age of Onset  . Pulmonary embolism Mother   . Depression Mother        major depressive disorder  . Hip fracture Mother        about 63   . Hypertension Mother   . Cancer Father        bladder  . Diabetes Brother   . Hyperlipidemia Brother   . Arthritis Brother        DJD  . Arthritis Brother   . Stroke Maternal Aunt   . Breast cancer Maternal Aunt   . Heart attack Neg Hx     Social History Social History   Tobacco Use  . Smoking status: Never Smoker  . Smokeless tobacco: Never Used  Substance Use Topics  . Alcohol use: Yes  . Drug use: No     Allergies   Contrast media [iodinated diagnostic agents]; Lisinopril; and Penicillins   Review of Systems Review of Systems  Constitutional: Negative for chills and fever.  HENT: Positive for congestion and sore throat.   Respiratory: Positive for cough.   Cardiovascular: Negative for chest pain.  Gastrointestinal: Positive for nausea. Negative for abdominal pain and vomiting.  Genitourinary: Negative for dysuria and hematuria.  All other systems reviewed and are negative.    Physical Exam Updated Vital Signs BP 118/60 (BP Location: Left Arm)   Pulse 79   Temp 98.4 F (36.9 C) (Oral)   Resp 18   Ht 5\' 4"  (1.626 m)   Wt 63.5 kg   LMP  (LMP Unknown)   SpO2 98%   BMI 24.03 kg/m   Physical Exam  Constitutional: She appears well-developed and well-nourished. No  distress.  Resting comfortably in chair, no apparent distress  HENT:  Head: Normocephalic and atraumatic.  TMs without erythema or bulging bilaterally.  Nasal septum midline, mild mucosal edema.  Posterior oropharynx with some erythema, no tonsillar hypertrophy, exudates, trismus, uvular deviation, or sublingual abnormalities.  Tolerating secretions without difficulty.  Eyes: Conjunctivae are normal. Right eye exhibits no discharge. Left eye exhibits no discharge.  Neck: Normal range of motion. Neck supple. No JVD present. No neck rigidity. No tracheal deviation present.  Cardiovascular: Normal  rate, regular rhythm, normal heart sounds and intact distal pulses.  Pulmonary/Chest: Effort normal. She has wheezes. She exhibits no tenderness.  Scattered wheezes.  Speaking in full sentences without difficulty.  SPO2 saturations 98% on room air.  Abdominal: Soft. Bowel sounds are normal. She exhibits no distension. There is no tenderness. There is no guarding.  Musculoskeletal: She exhibits no edema.  Lymphadenopathy:    She has no cervical adenopathy.  Neurological: She is alert.  Skin: No erythema.  Psychiatric: She has a normal mood and affect. Her behavior is normal.  Nursing note and vitals reviewed.    ED Treatments / Results  Labs (all labs ordered are listed, but only abnormal results are displayed) Labs Reviewed  GROUP A STREP BY PCR    EKG None  Radiology Dg Chest 2 View  Result Date: 02/04/2018 CLINICAL DATA:  Productive cough for 2 weeks. EXAM: CHEST - 2 VIEW COMPARISON:  01/24/2018 FINDINGS: Heart size is normal. No evidence of pulmonary infiltrate or edema. No evidence of pleural effusion. No significant change compared to prior study. IMPRESSION: No active cardiopulmonary disease. Electronically Signed   By: Earle Gell M.D.   On: 02/04/2018 10:12    Procedures Procedures (including critical care time)  Medications Ordered in ED Medications  ipratropium-albuterol  (DUONEB) 0.5-2.5 (3) MG/3ML nebulizer solution 3 mL (3 mLs Nebulization Given 02/04/18 0959)     Initial Impression / Assessment and Plan / ED Course  I have reviewed the triage vital signs and the nursing notes.  Pertinent labs & imaging results that were available during my care of the patient were reviewed by me and considered in my medical decision making (see chart for details).     Patient presenting for evaluation of ongoing cough and sore throat for almost 3 weeks.  She is afebrile, initially mildly hypertensive with resolution on reevaluation.  She is nontoxic in appearance.  She is tolerating secretions without difficulty.  She was seen by her PCP 2 days ago for the same who noted soft wheezes and prescribed albuterol which she has not filled or taken.  She was given a breathing treatment with improvement in her adventitious breath sounds. Chest x-ray shows no evidence of pneumonia or pleural effusion.  Strep test negative.  She is clinically well-appearing and I do not see need for any additional imaging or lab work at this time.  No increased work of breathing, tachycardia, or hypoxia noted on examination.  SPO2 saturations stable on room air.  Suspect viral process.  Doubt PE, MI, dissection, or other acute life-threatening cardiopulmonary pathology.  Will discharge with symptomatic treatment.  Recommend follow-up with PCP for reevaluation.  She reports that she will start taking the albuterol as needed for her wheezing.  Discussed strict ED return precautions. Pt verbalized understanding of and agreement with plan and is safe for discharge home at this time.  Patient seen and evaluated by Dr. Vanita Panda who agrees with assessment and plan at this time.  Final Clinical Impressions(s) / ED Diagnoses   Final diagnoses:  Sore throat  Cough    ED Discharge Orders         Ordered    brompheniramine-pseudoephedrine-DM 30-2-10 MG/5ML syrup  3 times daily PRN     02/04/18 [address] [place] A, PA-C 02/04/18 1153    Carmin Muskrat, MD 02/06/18 1821

## 2018-02-04 NOTE — Discharge Instructions (Addendum)
Your chest x-ray and strep test were negative for strep throat or pneumonia.  Start taking albuterol 1 to 2 puffs every 4-6 hours as needed for shortness of breath.  Start taking Cardec cough syrup as prescribed as needed for cough.  Drink plenty of water and get plenty of rest.  You can use warm water salt gargles, warm teas, honey, and Chloraseptic spray as needed for sore throat.  Follow-up with your PCP for reevaluation of your symptoms.  Return to the emergency department if any concerning signs or symptoms develop such as worsening chest pain, shortness of breath, high fevers, weakness, or vomiting.

## 2018-02-04 NOTE — ED Triage Notes (Signed)
Pt states she has been sick since Nov 21st and not getting better. Pt has cough that is productive

## 2018-02-10 ENCOUNTER — Ambulatory Visit (INDEPENDENT_AMBULATORY_CARE_PROVIDER_SITE_OTHER): Payer: Medicare Other | Admitting: Family Medicine

## 2018-02-10 ENCOUNTER — Encounter: Payer: Self-pay | Admitting: Family Medicine

## 2018-02-10 VITALS — BP 114/70 | HR 75 | Temp 98.0°F | Resp 16 | Wt 138.0 lb

## 2018-02-10 DIAGNOSIS — R059 Cough, unspecified: Secondary | ICD-10-CM

## 2018-02-10 DIAGNOSIS — R05 Cough: Secondary | ICD-10-CM

## 2018-02-10 NOTE — Progress Notes (Signed)
Subjective:     Patient ID: Nicole Wood, female   DOB: Mar 01, 1945, 72 y.o.   MRN: [id number]  HPI Patient is seen for persistent cough.  Onset around November 21.  She has had multiple visits for this thus far has been tried on multiple things including prednisone, Zithromax, Hycodan cough syrup, Mucinex, Tessalon without relief.  Her cough is worse at night.  She thinks she may have had some intermittent wheezing.  She states she has been using albuterol inhaler and also received nebulizer through the ER last Sunday without improvement.  She was given combination cough syrup with brompheniramine, Sudafed, and DM in ER but that did not help.  Chest x-ray unremarkable.  Cough occasionally productive of thick mucus.  No hemoptysis.  No appetite or weight changes.  Never smoked.  Husband now has similar cough.  Past Medical History:  Diagnosis Date  . Basal cell cancer    nose  . Cystocele   . Depression    in the past  . Hematuria    idiopathic  . Hypertension   . Osteoarthritis   . Osteopenia    dexa 2009, -2.3 osteoporosis -2.5 hip 2011  . Urinary incontinence    Past Surgical History:  Procedure Laterality Date  . ABDOMINAL HYSTERECTOMY     fibroids and cyst right ovary left oopherectomy  . basal cell ca removed    . bladder tack    . BREAST BIOPSY Left    benign  . rt shoulder surgery   2004   [place]  . URETHRAL DILATION      reports that she has never smoked. She has never used smokeless tobacco. She reports current alcohol use. She reports that she does not use drugs. family history includes Arthritis in her brother and brother; Breast cancer in her maternal aunt; Cancer in her father; Depression in her mother; Diabetes in her brother; Hip fracture in her mother; Hyperlipidemia in her brother; Hypertension in her mother; Pulmonary embolism in her mother; Stroke in her maternal aunt. Allergies  Allergen Reactions  . Contrast Media [Iodinated Diagnostic Agents] Other  (See Comments)    Gums got numb after a procedure  . Lisinopril Cough    Ok on  losartan  . Penicillins Rash   \  Review of Systems  Constitutional: Negative for appetite change, chills, fever and unexpected weight change.  HENT: Negative for sinus pain.   Respiratory: Positive for cough. Negative for shortness of breath and wheezing.   Cardiovascular: Negative for chest pain.       Objective:   Physical Exam Constitutional:      Appearance: Normal appearance.  Neck:     Musculoskeletal: Neck supple.  Cardiovascular:     Rate and Rhythm: Normal rate and regular rhythm.  Pulmonary:     Effort: Pulmonary effort is normal.     Breath sounds: Normal breath sounds. No wheezing or rales.  Lymphadenopathy:     Cervical: No cervical adenopathy.  Neurological:     Mental Status: She is alert.        Assessment:     Persistent cough.  Suspect acute bronchitis.  Nonfocal exam.  She does not have any red flags such as hypoxia, dyspnea, fever, appetite or weight changes, hemoptysis, etc.  Recent chest x-ray unremarkable    Plan:     Follow-up promptly for any fever, increased shortness of breath, or other concerns  Suggested over-the-counter plain Mucinex up to 1200 mg twice daily  No clear  indication for further antibiotics at this time.  She does not have evidence for reactive airway disease so we have not recommended further albuterol or prednisone unless her symptoms change    Eulas Post MD [facility]

## 2018-02-10 NOTE — Patient Instructions (Signed)
Suspect acute viral bronchitis  Consider increased dose of Mucinex 1,200 mg twice  Follow up immediately for any fever or increased shortness of breath.

## 2018-03-11 ENCOUNTER — Other Ambulatory Visit: Payer: Self-pay | Admitting: Internal Medicine

## 2018-03-12 NOTE — Telephone Encounter (Signed)
Please advise Last Ov:05/15/17 Last Filled:04/13/17

## 2018-03-13 NOTE — Telephone Encounter (Signed)
Sent in electronically . Has upcoming yearly visit

## 2018-04-07 ENCOUNTER — Other Ambulatory Visit: Payer: Self-pay | Admitting: Internal Medicine

## 2018-04-14 ENCOUNTER — Other Ambulatory Visit: Payer: Self-pay | Admitting: Cardiology

## 2018-05-08 NOTE — Progress Notes (Signed)
Chief Complaint  Patient presents with  . Annual Exam    Pt has no concerns today. pt  is healthy and well  . Medication Management  . Hypertension    HPI: Nicole Wood 73 y.o. comes in today for annual visit and Chronic disease management    BP ok readings   No se of meds reports   Anxiety stable rare use of lorazepam  On low dose citalopram  Sleep is now better and steady   Bone healthVit d  Taking most days not every day  Had bad coughing illness   Dec January   Hs residual ocass cough with runny nose .    Had neg c xray  12 19  Had righ anterior cpp just now getting better and getting back into more activity .  Health Maintenance  Topic Date Due  . TETANUS/TDAP  02/28/2018  . MAMMOGRAM  06/07/2019  . COLONOSCOPY  12/02/2019  . INFLUENZA VACCINE  Completed  . DEXA SCAN  Completed  . Hepatitis C Screening  Completed  . PNA vac Low Risk Adult  Completed   Health Maintenance Review LIFESTYLE:  Exercise:   Not since  8 weeks   Had illness   Rib pain better  After coughing.  Tobacco/ETS: no Alcohol:  no Sugar beverages:  Minimal  Sleep: better.  Drug use: no HH:2  No cats dog    Hearing:  ok  Vision:  No limitations at present . Last eye check UTD  Safety:  Has smoke detector and wears seat belts.  . No excess sun exposure. Sees dentist regularly.  Preventive parameters: Reviewed   ADLS:   There are no problems or need for assistance  driving, feeding, obtaining food, dressing, toileting and bathing, managing money using phone. She is independent.    ROS:  GEN/ HEENT: No fever, significant weight changes sweats headaches vision problems hearing changes, CV/ PULM; No chest pain shortness of breath cough, syncope,edema  change in exercise tolerance. GI /GU: No adominal pain, vomiting, change in bowel habits. No blood in the stool. No significant GU symptoms. SKIN/HEME: ,no acute skin rashes suspicious lesions or bleeding. No lymphadenopathy, nodules, masses.    NEURO/ PSYCH:  No neurologic signs such as weakness numbness. No depression anxiety. IMM/ Allergy: No unusual infections.  Allergy .   REST of 12 system review negative except as per HPI   Past Medical History:  Diagnosis Date  . Basal cell cancer    nose  . Cystocele   . Depression    in the past  . Hematuria    idiopathic  . Hypertension   . Osteoarthritis   . Osteopenia    dexa 2009, -2.3 osteoporosis -2.5 hip 2011  . Urinary incontinence     Family History  Problem Relation Age of Onset  . Pulmonary embolism Mother   . Depression Mother        major depressive disorder  . Hip fracture Mother        about 4   . Hypertension Mother   . Cancer Father        bladder  . Diabetes Brother   . Hyperlipidemia Brother   . Arthritis Brother        DJD  . Arthritis Brother   . Stroke Maternal Aunt   . Breast cancer Maternal Aunt   . Heart attack Neg Hx     Social History   Socioeconomic History  . Marital status: Married  Spouse name: Not on file  . Number of children: Not on file  . Years of education: Not on file  . Highest education level: Not on file  Occupational History  . Not on file  Social Needs  . Financial resource strain: Not on file  . Food insecurity:    Worry: Not on file    Inability: Not on file  . Transportation needs:    Medical: Not on file    Non-medical: Not on file  Tobacco Use  . Smoking status: Never Smoker  . Smokeless tobacco: Never Used  Substance and Sexual Activity  . Alcohol use: Yes  . Drug use: No  . Sexual activity: Yes    Partners: Female  Lifestyle  . Physical activity:    Days per week: Not on file    Minutes per session: Not on file  . Stress: Not on file  Relationships  . Social connections:    Talks on phone: Not on file    Gets together: Not on file    Attends religious service: Not on file    Active member of club or organization: Not on file    Attends meetings of clubs or organizations: Not on file     Relationship status: Not on file  Other Topics Concern  . Not on file  Social History Narrative   Retired Arts development officer) some college   Married 4 children [place] and [place], and [place] Husband cancer MM. NHL in remission   Regular exercise- yes   HH of 2   No pets    Multiple myeloma and non hogkins lymphoma in husband x 6 years   Regular exercise: walk, swim, some biking   Caffeine use: coffee daily, sweet tea    Outpatient Encounter Medications as of 05/09/2018  Medication Sig  . bifidobacterium infantis (ALIGN) capsule Take 1 capsule by mouth daily.  . Cholecalciferol (VITAMIN D3) 5000 units CAPS Take 1 capsule by mouth daily.  . citalopram (CELEXA) 10 MG tablet TAKE 1 TABLET(10 MG) BY MOUTH TWICE DAILY  . cyclobenzaprine (FLEXERIL) 10 MG tablet Take 5 mg by mouth at bedtime as needed (TMJ).   . LORazepam (ATIVAN) 0.5 MG tablet TAKE 1 TABLET(0.5 MG) BY MOUTH EVERY 8 HOURS AS NEEDED  . losartan (COZAAR) 50 MG tablet TAKE 1 TABLET(50 MG) BY MOUTH DAILY  . metoprolol succinate (TOPROL-XL) 50 MG 24 hr tablet Take 1 tablet (50 mg total) by mouth at bedtime. Pt has appt scheduled for 06/17/18  . Multiple Vitamins-Minerals (ICAPS PO) Take 1 capsule by mouth 2 (two) times daily.   . naproxen sodium (ANAPROX) 220 MG tablet Take 220 mg by mouth 2 (two) times daily as needed (pain). ALEVE  . polyethylene glycol (MIRALAX / GLYCOLAX) packet Take 8.5 g by mouth daily.  Marland Kitchen albuterol (PROVENTIL HFA;VENTOLIN HFA) 108 (90 Base) MCG/ACT inhaler Inhale 2 puffs into the lungs every 4 (four) hours as needed for wheezing or shortness of breath. (Patient not taking: Reported on 05/09/2018)   No facility-administered encounter medications on file as of 05/09/2018.     EXAM:  BP 122/70 (BP Location: Right Arm, Patient Position: Sitting, Cuff Size: Normal)   Pulse 61   Temp (!) 97.4 F (36.3 C) (Oral)   Ht '5\' 4"'  (1.626 m)   Wt 142 lb 9.6 oz (64.7 kg)   LMP  (LMP Unknown)   BMI 24.48 kg/m    Body mass index is 24.48 kg/m.  Physical Exam: Vital signs reviewed KPT:WSFK  is a well-developed well-nourished alert cooperative   who appears stated age in no acute distress.  HEENT: normocephalic atraumatic , Eyes: PERRL EOM's full, conjunctiva clear, Nares: paten,t no deformity discharge or tenderness., Ears: no deformity EAC's clear TMs with normal landmarks. Mouth: clear OP, no lesions, edema.  Moist mucous membranes. Dentition in adequate repair. NECK: supple without masses, thyromegaly or bruits. CHEST/PULM:  Clear to auscultation and percussion breath sounds equal no wheeze , rales or rhonchi. No chest wall deformities or tenderness. CV: PMI is nondisplaced, S1 S2 no gallops, murmurs, rubs. Peripheral pulses are full without delay.No JVD .  ABDOMEN: Bowel sounds normal nontender  No guard or rebound, no hepato splenomegal no CVA tenderness.   Extremtities:  No clubbing cyanosis or edema, no acute joint swelling or redness no focal atrophy NEURO:  Oriented x3, cranial nerves 3-12 appear to be intact, no obvious focal weakness,gait within normal limits no abnormal reflexes or asymmetrical SKIN: No acute rashes normal turgor, color, no bruising or petechiae. PSYCH: Oriented, good eye contact, no obvious depression anxiety, cognition and judgment appear normal. LN: no cervical axillary inguinal adenopathy No noted deficits in memory, attention, and speech.   Lab Results  Component Value Date   WBC 5.9 05/09/2018   HGB 12.0 05/09/2018   HCT 36.3 05/09/2018   PLT 290.0 05/09/2018   GLUCOSE 86 05/09/2018   CHOL 184 05/09/2018   TRIG 113.0 05/09/2018   HDL 51.00 05/09/2018   LDLDIRECT 137.2 12/14/2010   LDLCALC 110 (H) 05/09/2018   ALT 10 05/09/2018   AST 13 05/09/2018   NA 135 05/09/2018   K 4.5 05/09/2018   CL 99 05/09/2018   CREATININE 0.79 05/09/2018   BUN 15 05/09/2018   CO2 29 05/09/2018   TSH 3.20 05/04/2017   HGBA1C 5.6 01/14/2015    ASSESSMENT AND  PLAN:  Discussed the following assessment and plan:  Essential hypertension - Plan: Basic metabolic panel, CBC with Differential/Platelet, Hepatic function panel, Lipid panel, VITAMIN D 25 Hydroxy (Vit-D Deficiency, Fractures)  Medication management - Plan: Basic metabolic panel, CBC with Differential/Platelet, Hepatic function panel, Lipid panel, VITAMIN D 25 Hydroxy (Vit-D Deficiency, Fractures)  Palpitation ocassional - controlled  - Plan: Basic metabolic panel, CBC with Differential/Platelet, Hepatic function panel, Lipid panel, VITAMIN D 25 Hydroxy (Vit-D Deficiency, Fractures)  Postmenopausal osteoporosis - Plan: Basic metabolic panel, CBC with Differential/Platelet, Hepatic function panel, Lipid panel, VITAMIN D 25 Hydroxy (Vit-D Deficiency, Fractures)  Vitamin D deficiency - Plan: Basic metabolic panel, CBC with Differential/Platelet, Hepatic function panel, Lipid panel, VITAMIN D 25 Hydroxy (Vit-D Deficiency, Fractures) Disc  Getting   shingrix  And potential side effect   Bone health  Thinks she is utd but  Will review record     After left .  Last dexa in record is   2014   osteoprosis not taking meds  Patient Care Team: Panosh, Standley Brooking, MD as PCP - General Rolm Bookbinder, MD (Dermatology) Frederik Schmidt, MD as Attending Physician (Oral Surgery) Jerline Pain, MD as Consulting Physician (Cardiology)  Patient Instructions  Glad you are doing well . No change in medication  Continue healthy eating  . Weight bearing exercise      Preventive Care 65 Years and Older, Female Preventive care refers to lifestyle choices and visits with your health care provider that can promote health and wellness. What does preventive care include?  A yearly physical exam. This is also called an annual well check.  Dental exams once or twice  a year.  Routine eye exams. Ask your health care provider how often you should have your eyes checked.  Personal lifestyle choices, including: ? Daily  care of your teeth and gums. ? Regular physical activity. ? Eating a healthy diet. ? Avoiding tobacco and drug use. ? Limiting alcohol use. ? Practicing safe sex. ? Taking low-dose aspirin every day. ? Taking vitamin and mineral supplements as recommended by your health care provider. What happens during an annual well check? The services and screenings done by your health care provider during your annual well check will depend on your age, overall health, lifestyle risk factors, and family history of disease. Counseling Your health care provider may ask you questions about your:  Alcohol use.  Tobacco use.  Drug use.  Emotional well-being.  Home and relationship well-being.  Sexual activity.  Eating habits.  History of falls.  Memory and ability to understand (cognition).  Work and work Statistician.  Reproductive health.  Screening You may have the following tests or measurements:  Height, weight, and BMI.  Blood pressure.  Lipid and cholesterol levels. These may be checked every 5 years, or more frequently if you are over 52 years old.  Skin check.  Lung cancer screening. You may have this screening every year starting at age 27 if you have a 30-pack-year history of smoking and currently smoke or have quit within the past 15 years.  Colorectal cancer screening. All adults should have this screening starting at age 50 and continuing until age 71. You will have tests every 1-10 years, depending on your results and the type of screening test. People at increased risk should start screening at an earlier age. Screening tests may include: ? Guaiac-based fecal occult blood testing. ? Fecal immunochemical test (FIT). ? Stool DNA test. ? Virtual colonoscopy. ? Sigmoidoscopy. During this test, a flexible tube with a tiny camera (sigmoidoscope) is used to examine your rectum and lower colon. The sigmoidoscope is inserted through your anus into your rectum and lower  colon. ? Colonoscopy. During this test, a long, thin, flexible tube with a tiny camera (colonoscope) is used to examine your entire colon and rectum.  Hepatitis C blood test.  Hepatitis B blood test.  Sexually transmitted disease (STD) testing.  Diabetes screening. This is done by checking your blood sugar (glucose) after you have not eaten for a while (fasting). You may have this done every 1-3 years.  Bone density scan. This is done to screen for osteoporosis. You may have this done starting at age 55.  Mammogram. This may be done every 1-2 years. Talk to your health care provider about how often you should have regular mammograms. Talk with your health care provider about your test results, treatment options, and if necessary, the need for more tests. Vaccines Your health care provider may recommend certain vaccines, such as:  Influenza vaccine. This is recommended every year.  Tetanus, diphtheria, and acellular pertussis (Tdap, Td) vaccine. You may need a Td booster every 10 years.  Varicella vaccine. You may need this if you have not been vaccinated.  Zoster vaccine. You may need this after age 79.  Measles, mumps, and rubella (MMR) vaccine. You may need at least one dose of MMR if you were born in 1957 or later. You may also need a second dose.  Pneumococcal 13-valent conjugate (PCV13) vaccine. One dose is recommended after age 49.  Pneumococcal polysaccharide (PPSV23) vaccine. One dose is recommended after age 53.  Meningococcal vaccine. You may  need this if you have certain conditions.  Hepatitis A vaccine. You may need this if you have certain conditions or if you travel or work in places where you may be exposed to hepatitis A.  Hepatitis B vaccine. You may need this if you have certain conditions or if you travel or work in places where you may be exposed to hepatitis B.  Haemophilus influenzae type b (Hib) vaccine. You may need this if you have certain  conditions. Talk to your health care provider about which screenings and vaccines you need and how often you need them. This information is not intended to replace advice given to you by your health care provider. Make sure you discuss any questions you have with your health care provider. Document Released: 03/13/2015 Document Revised: 04/06/2017 Document Reviewed: 12/16/2014 Elsevier Interactive Patient Education  2019 [place] K. Panosh M.D.

## 2018-05-09 ENCOUNTER — Other Ambulatory Visit: Payer: Self-pay

## 2018-05-09 ENCOUNTER — Ambulatory Visit (INDEPENDENT_AMBULATORY_CARE_PROVIDER_SITE_OTHER): Payer: Medicare Other | Admitting: Internal Medicine

## 2018-05-09 ENCOUNTER — Encounter: Payer: Self-pay | Admitting: Internal Medicine

## 2018-05-09 VITALS — BP 122/70 | HR 61 | Temp 97.4°F | Ht 64.0 in | Wt 142.6 lb

## 2018-05-09 DIAGNOSIS — I10 Essential (primary) hypertension: Secondary | ICD-10-CM

## 2018-05-09 DIAGNOSIS — R002 Palpitations: Secondary | ICD-10-CM

## 2018-05-09 DIAGNOSIS — E559 Vitamin D deficiency, unspecified: Secondary | ICD-10-CM

## 2018-05-09 DIAGNOSIS — M810 Age-related osteoporosis without current pathological fracture: Secondary | ICD-10-CM | POA: Diagnosis not present

## 2018-05-09 DIAGNOSIS — Z79899 Other long term (current) drug therapy: Secondary | ICD-10-CM

## 2018-05-09 LAB — CBC WITH DIFFERENTIAL/PLATELET
Basophils Absolute: 0.1 10*3/uL (ref 0.0–0.1)
Basophils Relative: 1 % (ref 0.0–3.0)
EOS PCT: 2.8 % (ref 0.0–5.0)
Eosinophils Absolute: 0.2 10*3/uL (ref 0.0–0.7)
HCT: 36.3 % (ref 36.0–46.0)
Hemoglobin: 12 g/dL (ref 12.0–15.0)
Lymphocytes Relative: 29.8 % (ref 12.0–46.0)
Lymphs Abs: 1.8 10*3/uL (ref 0.7–4.0)
MCHC: 33.1 g/dL (ref 30.0–36.0)
MCV: 92.7 fl (ref 78.0–100.0)
Monocytes Absolute: 0.5 10*3/uL (ref 0.1–1.0)
Monocytes Relative: 8.9 % (ref 3.0–12.0)
Neutro Abs: 3.4 10*3/uL (ref 1.4–7.7)
Neutrophils Relative %: 57.5 % (ref 43.0–77.0)
Platelets: 290 10*3/uL (ref 150.0–400.0)
RBC: 3.92 Mil/uL (ref 3.87–5.11)
RDW: 14.6 % (ref 11.5–15.5)
WBC: 5.9 10*3/uL (ref 4.0–10.5)

## 2018-05-09 LAB — BASIC METABOLIC PANEL
BUN: 15 mg/dL (ref 6–23)
CO2: 29 mEq/L (ref 19–32)
Calcium: 9.1 mg/dL (ref 8.4–10.5)
Chloride: 99 mEq/L (ref 96–112)
Creatinine, Ser: 0.79 mg/dL (ref 0.40–1.20)
GFR: 71.3 mL/min (ref >60.00)
GLUCOSE: 86 mg/dL (ref 70–99)
POTASSIUM: 4.5 meq/L (ref 3.5–5.1)
Sodium: 135 mEq/L (ref 135–145)

## 2018-05-09 LAB — HEPATIC FUNCTION PANEL
ALT: 10 U/L (ref 0–35)
AST: 13 U/L (ref 0–37)
Albumin: 4.2 g/dL (ref 3.5–5.2)
Alkaline Phosphatase: 96 U/L (ref 39–117)
Bilirubin, Direct: 0 mg/dL (ref 0.0–0.3)
Total Bilirubin: 0.4 mg/dL (ref 0.2–1.2)
Total Protein: 6.5 g/dL (ref 6.0–8.3)

## 2018-05-09 LAB — LIPID PANEL
CHOLESTEROL: 184 mg/dL (ref 0–200)
HDL: 51 mg/dL (ref >39.00)
LDL CALC: 110 mg/dL — AB (ref 0–99)
NonHDL: 132.83
Total CHOL/HDL Ratio: 4
Triglycerides: 113 mg/dL (ref 0.0–149.0)
VLDL: 22.6 mg/dL (ref 0.0–40.0)

## 2018-05-09 LAB — VITAMIN D 25 HYDROXY (VIT D DEFICIENCY, FRACTURES): VITD: 26.1 ng/mL — AB (ref 30.00–100.00)

## 2018-05-09 NOTE — Patient Instructions (Signed)
Glad you are doing well . No change in medication  Continue healthy eating  . Weight bearing exercise      Preventive Care 73 Years and Older, Female Preventive care refers to lifestyle choices and visits with your health care provider that can promote health and wellness. What does preventive care include?  A yearly physical exam. This is also called an annual well check.  Dental exams once or twice a year.  Routine eye exams. Ask your health care provider how often you should have your eyes checked.  Personal lifestyle choices, including: ? Daily care of your teeth and gums. ? Regular physical activity. ? Eating a healthy diet. ? Avoiding tobacco and drug use. ? Limiting alcohol use. ? Practicing safe sex. ? Taking low-dose aspirin every day. ? Taking vitamin and mineral supplements as recommended by your health care provider. What happens during an annual well check? The services and screenings done by your health care provider during your annual well check will depend on your age, overall health, lifestyle risk factors, and family history of disease. Counseling Your health care provider may ask you questions about your:  Alcohol use.  Tobacco use.  Drug use.  Emotional well-being.  Home and relationship well-being.  Sexual activity.  Eating habits.  History of falls.  Memory and ability to understand (cognition).  Work and work Statistician.  Reproductive health.  Screening You may have the following tests or measurements:  Height, weight, and BMI.  Blood pressure.  Lipid and cholesterol levels. These may be checked every 5 years, or more frequently if you are over 46 years old.  Skin check.  Lung cancer screening. You may have this screening every year starting at age 73 if you have a 30-pack-year history of smoking and currently smoke or have quit within the past 15 years.  Colorectal cancer screening. All adults should have this screening  starting at age 73 and continuing until age 5. You will have tests every 1-10 years, depending on your results and the type of screening test. People at increased risk should start screening at an earlier age. Screening tests may include: ? Guaiac-based fecal occult blood testing. ? Fecal immunochemical test (FIT). ? Stool DNA test. ? Virtual colonoscopy. ? Sigmoidoscopy. During this test, a flexible tube with a tiny camera (sigmoidoscope) is used to examine your rectum and lower colon. The sigmoidoscope is inserted through your anus into your rectum and lower colon. ? Colonoscopy. During this test, a long, thin, flexible tube with a tiny camera (colonoscope) is used to examine your entire colon and rectum.  Hepatitis C blood test.  Hepatitis B blood test.  Sexually transmitted disease (STD) testing.  Diabetes screening. This is done by checking your blood sugar (glucose) after you have not eaten for a while (fasting). You may have this done every 1-3 years.  Bone density scan. This is done to screen for osteoporosis. You may have this done starting at age 73.  Mammogram. This may be done every 1-2 years. Talk to your health care provider about how often you should have regular mammograms. Talk with your health care provider about your test results, treatment options, and if necessary, the need for more tests. Vaccines Your health care provider may recommend certain vaccines, such as:  Influenza vaccine. This is recommended every year.  Tetanus, diphtheria, and acellular pertussis (Tdap, Td) vaccine. You may need a Td booster every 10 years.  Varicella vaccine. You may need this if you have not  been vaccinated.  Zoster vaccine. You may need this after age 73.  Measles, mumps, and rubella (MMR) vaccine. You may need at least one dose of MMR if you were born in 1957 or later. You may also need a second dose.  Pneumococcal 13-valent conjugate (PCV13) vaccine. One dose is recommended  after age 73.  Pneumococcal polysaccharide (PPSV23) vaccine. One dose is recommended after age 73.  Meningococcal vaccine. You may need this if you have certain conditions.  Hepatitis A vaccine. You may need this if you have certain conditions or if you travel or work in places where you may be exposed to hepatitis A.  Hepatitis B vaccine. You may need this if you have certain conditions or if you travel or work in places where you may be exposed to hepatitis B.  Haemophilus influenzae type b (Hib) vaccine. You may need this if you have certain conditions. Talk to your health care provider about which screenings and vaccines you need and how often you need them. This information is not intended to replace advice given to you by your health care provider. Make sure you discuss any questions you have with your health care provider. Document Released: 03/13/2015 Document Revised: 04/06/2017 Document Reviewed: 12/16/2014 Elsevier Interactive Patient Education  2019 [employer].

## 2018-05-11 ENCOUNTER — Other Ambulatory Visit: Payer: Self-pay | Admitting: Internal Medicine

## 2018-05-17 ENCOUNTER — Other Ambulatory Visit: Payer: Self-pay | Admitting: Internal Medicine

## 2018-05-19 ENCOUNTER — Other Ambulatory Visit: Payer: Self-pay | Admitting: Internal Medicine

## 2018-05-31 ENCOUNTER — Other Ambulatory Visit: Payer: Self-pay | Admitting: Internal Medicine

## 2018-06-18 ENCOUNTER — Other Ambulatory Visit: Payer: Self-pay

## 2018-06-18 ENCOUNTER — Encounter: Payer: Self-pay | Admitting: Cardiology

## 2018-06-18 ENCOUNTER — Telehealth (INDEPENDENT_AMBULATORY_CARE_PROVIDER_SITE_OTHER): Payer: Medicare Other | Admitting: Cardiology

## 2018-06-18 VITALS — BP 115/61 | HR 56 | Ht 64.0 in | Wt 139.0 lb

## 2018-06-18 DIAGNOSIS — I10 Essential (primary) hypertension: Secondary | ICD-10-CM

## 2018-06-18 DIAGNOSIS — R002 Palpitations: Secondary | ICD-10-CM

## 2018-06-18 NOTE — Progress Notes (Signed)
Virtual Visit via Video Note   This visit type was conducted due to national recommendations for restrictions regarding the COVID-19 Pandemic (e.g. social distancing) in an effort to limit this patient's exposure and mitigate transmission in our community.  Due to her co-morbid illnesses, this patient is at least at moderate risk for complications without adequate follow up.  This format is felt to be most appropriate for this patient at this time.  All issues noted in this document were discussed and addressed.  A limited physical exam was performed with this format.  Please refer to the patient's chart for her consent to telehealth for [facility].   Evaluation Performed:  Follow-up visit  Date:  06/18/2018   ID:  Nicole Wood, Nicole Wood [birth year], MRN [id number]  Patient Location: Home Provider Location: Home  PCP:  Burnis Medin, MD  Cardiologist:  Candee Furbish, MD  Electrophysiologist:  None   Chief Complaint: Tachycardia follow-up  History of Present Illness:    Nicole Wood is a 73 y.o. female with palpitations here for follow-up.  Previously tachycardia noted.  Last EKG on 06/16/2017 showed sinus rhythm 71 with no other abnormalities.  Prior to that 96.  Event monitor in 2018 was reassuring with no evidence of atrial fibrillation.  At prior visit we increased her Toprol-XL to 50 mg in the evening hours.  Previously went to the emergency room with cough, chest x-ray normal.  Episodes of tachycardia are about the same.  She does however feel that the Toprol has been doing a pretty good job of controlling it.  She has a question that if she feels her heart rate racing in the middle night which she should do, I encourage deep breathing, can take an extra Toprol if necessary.  She also has low-dose Ativan to take.  Last LDL 110, creatinine 0.79  The patient does not have symptoms concerning for COVID-19 infection (fever, chills, cough, or new shortness of breath).    Past  Medical History:  Diagnosis Date  . Basal cell cancer    nose  . Cystocele   . Depression    in the past  . Hematuria    idiopathic  . Hypertension   . Osteoarthritis   . Osteopenia    dexa 2009, -2.3 osteoporosis -2.5 hip 2011  . Urinary incontinence    Past Surgical History:  Procedure Laterality Date  . ABDOMINAL HYSTERECTOMY     fibroids and cyst right ovary left oopherectomy  . basal cell ca removed    . bladder tack    . BREAST BIOPSY Left    benign  . rt shoulder surgery   2004   [place]  . URETHRAL DILATION       Current Meds  Medication Sig  . albuterol (PROVENTIL HFA;VENTOLIN HFA) 108 (90 Base) MCG/ACT inhaler Inhale 2 puffs into the lungs every 4 (four) hours as needed for wheezing or shortness of breath.  . bifidobacterium infantis (ALIGN) capsule Take 1 capsule by mouth daily.  . Cholecalciferol (VITAMIN D3) 5000 units CAPS Take 1 capsule by mouth daily.  . citalopram (CELEXA) 10 MG tablet TAKE 1 TABLET(10 MG) BY MOUTH TWICE DAILY  . cyclobenzaprine (FLEXERIL) 10 MG tablet Take 5 mg by mouth at bedtime as needed (TMJ).   . LORazepam (ATIVAN) 0.5 MG tablet TAKE 1 TABLET(0.5 MG) BY MOUTH EVERY 8 HOURS AS NEEDED  . losartan (COZAAR) 50 MG tablet TAKE 1 TABLET(50 MG) BY MOUTH DAILY  . metoprolol succinate (TOPROL-XL)  50 MG 24 hr tablet Take 1 tablet (50 mg total) by mouth at bedtime. Pt has appt scheduled for 06/17/18  . Multiple Vitamins-Minerals (ICAPS PO) Take 1 capsule by mouth 2 (two) times daily.   . naproxen sodium (ANAPROX) 220 MG tablet Take 220 mg by mouth 2 (two) times daily as needed (pain). ALEVE  . polyethylene glycol (MIRALAX / GLYCOLAX) packet Take 8.5 g by mouth daily.     Allergies:   Contrast media [iodinated diagnostic agents]; Lisinopril; and Penicillins   Social History   Tobacco Use  . Smoking status: Never Smoker  . Smokeless tobacco: Never Used  Substance Use Topics  . Alcohol use: Yes  . Drug use: No     Family Hx: The patient's  family history includes Arthritis in her brother and brother; Breast cancer in her maternal aunt; Cancer in her father; Depression in her mother; Diabetes in her brother; Hip fracture in her mother; Hyperlipidemia in her brother; Hypertension in her mother; Pulmonary embolism in her mother; Stroke in her maternal aunt. There is no history of Heart attack.  ROS:   Please see the history of present illness.    Denies any fevers chills nausea vomiting syncope bleeding All other systems reviewed and are negative.   Prior CV studies:   The following studies were reviewed today:  Prior event monitor reviewed-no atrial fibrillation  Labs/Other Tests and Data Reviewed:    EKG:  Prior EKGs reviewed, sinus rhythm  Recent Labs: 05/09/2018: ALT 10; BUN 15; Creatinine, Ser 0.79; Hemoglobin 12.0; Platelets 290.0; Potassium 4.5; Sodium 135   Recent Lipid Panel Lab Results  Component Value Date/Time   CHOL 184 05/09/2018 10:57 AM   TRIG 113.0 05/09/2018 10:57 AM   HDL 51.00 05/09/2018 10:57 AM   CHOLHDL 4 05/09/2018 10:57 AM   LDLCALC 110 (H) 05/09/2018 10:57 AM   LDLDIRECT 137.2 12/14/2010 11:18 AM    Wt Readings from Last 3 Encounters:  06/18/18 139 lb (63 kg)  05/09/18 142 lb 9.6 oz (64.7 kg)  02/10/18 138 lb (62.6 kg)     Objective:    Vital Signs:  BP 115/61   Pulse (!) 56   Ht 5\' 4"  (1.626 m)   Wt 139 lb (63 kg)   LMP  (LMP Unknown)   BMI 23.86 kg/m    VITAL SIGNS:  reviewed GEN:  no acute distress EYES:  sclerae anicteric, EOMI - Extraocular Movements Intact RESPIRATORY:  normal respiratory effort, symmetric expansion CARDIOVASCULAR:  no peripheral edema SKIN:  no rash, lesions or ulcers. MUSCULOSKELETAL:  no obvious deformities. NEURO:  alert and oriented x 3, no obvious focal deficit PSYCH:  normal affect  ASSESSMENT & PLAN:    Palpitations - Overall doing quite well with her Toprol-XL 50 mg.  Continue.  No changes made. - Deep breathing exercises can help as  well.  She does have low-dose Ativan she very rarely takes she states.  Essential hypertension -Excellently controlled.  No changes made.  Losartan.  Toprol.   COVID-19 Education: The signs and symptoms of COVID-19 were discussed with the patient and how to seek care for testing (follow up with PCP or arrange E-visit).  The importance of social distancing was discussed today.  Time:   Today, I have spent 10 minutes with the patient with telehealth technology discussing the above problems.     Medication Adjustments/Labs and Tests Ordered: Current medicines are reviewed at length with the patient today.  Concerns regarding medicines are outlined above.  Tests Ordered: No orders of the defined types were placed in this encounter.   Medication Changes: No orders of the defined types were placed in this encounter.   Disposition:  Follow up in 1 year(s)  Signed, Candee Furbish, MD  06/18/2018 9:28 AM    [facility]

## 2018-06-18 NOTE — Patient Instructions (Signed)
  Medication Instructions:  The current medical regimen is effective;  continue present plan and medications.  If you need a refill on your cardiac medications before your next appointment, please call your pharmacy.   Follow-Up: Follow up in 1 year with Dr. Skains.  You will receive a letter in the mail 2 months before you are due.  Please call us when you receive this letter to schedule your follow up appointment.  Thank you for choosing [facility]!!     

## 2018-06-23 ENCOUNTER — Other Ambulatory Visit: Payer: Self-pay | Admitting: Internal Medicine

## 2018-06-25 ENCOUNTER — Other Ambulatory Visit: Payer: Self-pay | Admitting: Internal Medicine

## 2018-07-11 ENCOUNTER — Other Ambulatory Visit: Payer: Self-pay | Admitting: Cardiology

## 2018-07-23 ENCOUNTER — Other Ambulatory Visit: Payer: Self-pay | Admitting: Internal Medicine

## 2018-07-24 NOTE — Telephone Encounter (Signed)
Please advise 

## 2018-09-22 ENCOUNTER — Other Ambulatory Visit: Payer: Self-pay | Admitting: Internal Medicine

## 2018-11-15 DIAGNOSIS — Z23 Encounter for immunization: Secondary | ICD-10-CM | POA: Diagnosis not present

## 2018-12-05 DIAGNOSIS — D1801 Hemangioma of skin and subcutaneous tissue: Secondary | ICD-10-CM | POA: Diagnosis not present

## 2018-12-05 DIAGNOSIS — Z85828 Personal history of other malignant neoplasm of skin: Secondary | ICD-10-CM | POA: Diagnosis not present

## 2018-12-05 DIAGNOSIS — L819 Disorder of pigmentation, unspecified: Secondary | ICD-10-CM | POA: Diagnosis not present

## 2018-12-05 DIAGNOSIS — L821 Other seborrheic keratosis: Secondary | ICD-10-CM | POA: Diagnosis not present

## 2018-12-05 DIAGNOSIS — L814 Other melanin hyperpigmentation: Secondary | ICD-10-CM | POA: Diagnosis not present

## 2019-02-11 ENCOUNTER — Other Ambulatory Visit: Payer: Self-pay | Admitting: Internal Medicine

## 2019-02-11 NOTE — Telephone Encounter (Signed)
Last ov:06/18/18 Last filled:03/13/2018 Please advise in absence of Dr.Panosh

## 2019-03-09 ENCOUNTER — Other Ambulatory Visit: Payer: Self-pay | Admitting: Internal Medicine

## 2019-03-11 NOTE — Telephone Encounter (Signed)
Please get her  appt for yearly med check  I will refill x 1 in interim

## 2019-03-25 ENCOUNTER — Ambulatory Visit: Payer: Medicare Other | Attending: Internal Medicine

## 2019-03-25 DIAGNOSIS — Z23 Encounter for immunization: Secondary | ICD-10-CM | POA: Insufficient documentation

## 2019-03-25 NOTE — Progress Notes (Signed)
   Covid-19 Vaccination Clinic  Name:  Nicole Wood    MRN: [id number] DOB: 1945-08-30  03/25/2019  Ms. Burn was observed post Covid-19 immunization for 15 minutes without incidence. She was provided with Vaccine Information Sheet and instruction to access the V-Safe system.   [name] was instructed to call 911 with any severe reactions post vaccine: Marland Kitchen Difficulty breathing  . Swelling of your face and throat  . A fast heartbeat  . A bad rash all over your body  . Dizziness and weakness    Immunizations Administered    Name Date Dose VIS Date Route   Pfizer COVID-19 Vaccine 03/25/2019 10:08 AM 0.3 mL 02/08/2019 Intramuscular   Manufacturer: [place]   Lot: [id number]   [place]: [id number]

## 2019-04-15 ENCOUNTER — Ambulatory Visit: Payer: Medicare Other | Attending: Internal Medicine

## 2019-04-15 DIAGNOSIS — Z23 Encounter for immunization: Secondary | ICD-10-CM | POA: Insufficient documentation

## 2019-04-15 NOTE — Progress Notes (Signed)
   Covid-19 Vaccination Clinic  Name:  OCEAN VANDEVANDER    MRN: [id number] DOB: 07/14/1945  04/15/2019  Ms. Meservey was observed post Covid-19 immunization for 15 minutes without incidence. She was provided with Vaccine Information Sheet and instruction to access the V-Safe system.   Ms. Karlovich was instructed to call 911 with any severe reactions post vaccine: Marland Kitchen Difficulty breathing  . Swelling of your face and throat  . A fast heartbeat  . A bad rash all over your body  . Dizziness and weakness    Immunizations Administered    Name Date Dose VIS Date Route   Pfizer COVID-19 Vaccine 04/15/2019 10:51 AM 0.3 mL 02/08/2019 Intramuscular   Manufacturer: [place]   Lot: [id number]   [place]: [id number]

## 2019-04-25 ENCOUNTER — Other Ambulatory Visit: Payer: Self-pay | Admitting: Cardiology

## 2019-04-29 ENCOUNTER — Other Ambulatory Visit: Payer: Self-pay | Admitting: Internal Medicine

## 2019-06-26 ENCOUNTER — Other Ambulatory Visit: Payer: Self-pay

## 2019-06-26 ENCOUNTER — Ambulatory Visit (INDEPENDENT_AMBULATORY_CARE_PROVIDER_SITE_OTHER): Payer: Medicare Other | Admitting: Cardiology

## 2019-06-26 ENCOUNTER — Encounter: Payer: Self-pay | Admitting: Cardiology

## 2019-06-26 VITALS — BP 126/70 | HR 67 | Ht 64.0 in | Wt 154.0 lb

## 2019-06-26 DIAGNOSIS — R002 Palpitations: Secondary | ICD-10-CM | POA: Diagnosis not present

## 2019-06-26 DIAGNOSIS — I10 Essential (primary) hypertension: Secondary | ICD-10-CM | POA: Diagnosis not present

## 2019-06-26 NOTE — Progress Notes (Signed)
Cardiology Office Note:    Date:  06/26/2019   ID:  Nicole Wood, DOB 1945-03-17, MRN [id number]  PCP:  Burnis Medin, MD  Cardiologist:  Candee Furbish, MD  Electrophysiologist:  None   Referring MD: Burnis Medin, MD     History of Present Illness:    Nicole Wood is a 74 y.o. female here for the follow-up of palpitations.  Event monitor showed no evidence of atrial fibrillation in 2018  Use Toprol-XL 50 mg in the evening hours. Toprol overall has been doing a fairly good job.  Her grandson, 67 years old, just called to let her know that his floppy ear rabbit just died.  Sad about this.  Overall she has been doing quite well.  Her husband's birthday is today.  No fevers chills nausea vomiting syncope bleeding.  LDL 110 hemoglobin 12 creatinine 0.79 ALT 10 from outside labs.  Past Medical History:  Diagnosis Date  . Basal cell cancer    nose  . Cystocele   . Depression    in the past  . Hematuria    idiopathic  . Hypertension   . Osteoarthritis   . Osteopenia    dexa 2009, -2.3 osteoporosis -2.5 hip 2011  . Urinary incontinence     Past Surgical History:  Procedure Laterality Date  . ABDOMINAL HYSTERECTOMY     fibroids and cyst right ovary left oopherectomy  . basal cell ca removed    . bladder tack    . BREAST BIOPSY Left    benign  . rt shoulder surgery   2004   [place]  . URETHRAL DILATION      Current Medications: Current Meds  Medication Sig  . albuterol (PROVENTIL HFA;VENTOLIN HFA) 108 (90 Base) MCG/ACT inhaler Inhale 2 puffs into the lungs every 4 (four) hours as needed for wheezing or shortness of breath.  . bifidobacterium infantis (ALIGN) capsule Take 1 capsule by mouth daily.  . Cholecalciferol (VITAMIN D3) 5000 units CAPS Take 1 capsule by mouth daily.  . citalopram (CELEXA) 10 MG tablet TAKE 1 TABLET(10 MG) BY MOUTH TWICE DAILY  . cyclobenzaprine (FLEXERIL) 10 MG tablet Take 5 mg by mouth at bedtime as needed (TMJ).   . LORazepam  (ATIVAN) 0.5 MG tablet TAKE 1 TABLET(0.5 MG) BY MOUTH EVERY 8 HOURS AS NEEDED  . losartan (COZAAR) 50 MG tablet TAKE 1 TABLET(50 MG) BY MOUTH DAILY  . metoprolol succinate (TOPROL-XL) 50 MG 24 hr tablet TAKE 1 TABLET(50 MG) BY MOUTH AT BEDTIME  . Multiple Vitamins-Minerals (ICAPS PO) Take 1 capsule by mouth 2 (two) times daily.   . naproxen sodium (ANAPROX) 220 MG tablet Take 220 mg by mouth 2 (two) times daily as needed (pain). ALEVE  . polyethylene glycol (MIRALAX / GLYCOLAX) packet Take 8.5 g by mouth daily.     Allergies:   Contrast media [iodinated diagnostic agents], Lisinopril, and Penicillins   Social History   Socioeconomic History  . Marital status: Married    Spouse name: Not on file  . Number of children: Not on file  . Years of education: Not on file  . Highest education level: Not on file  Occupational History  . Not on file  Tobacco Use  . Smoking status: Never Smoker  . Smokeless tobacco: Never Used  Substance and Sexual Activity  . Alcohol use: Yes  . Drug use: No  . Sexual activity: Yes    Partners: Female  Other Topics Concern  . Not on file  Social History Narrative   Retired Arts development officer) some college   Married 4 children [place],, Midwife, and [place], and GC Husband cancer MM. NHL in remission   Regular exercise- yes   HH of 2   No pets    Multiple myeloma and non hogkins lymphoma in husband x 6 years   Regular exercise: walk, swim, some biking   Caffeine use: coffee daily, sweet tea   Social Determinants of Health   Financial Resource Strain:   . Difficulty of Paying Living Expenses:   Food Insecurity:   . Worried About Charity fundraiser in the Last Year:   . Arboriculturist in the Last Year:   Transportation Needs:   . Film/video editor (Medical):   Marland Kitchen Lack of Transportation (Non-Medical):   Physical Activity:   . Days of Exercise per Week:   . Minutes of Exercise per Session:   Stress:   . Feeling of Stress :   Social  Connections:   . Frequency of Communication with Friends and Family:   . Frequency of Social Gatherings with Friends and Family:   . Attends Religious Services:   . Active Member of Clubs or Organizations:   . Attends Archivist Meetings:   Marland Kitchen Marital Status:      Family History: The patient's family history includes Arthritis in her brother and brother; Breast cancer in her maternal aunt; Cancer in her father; Depression in her mother; Diabetes in her brother; Hip fracture in her mother; Hyperlipidemia in her brother; Hypertension in her mother; Pulmonary embolism in her mother; Stroke in her maternal aunt. There is no history of Heart attack.  ROS:   Please see the history of present illness.     All other systems reviewed and are negative.  EKGs/Labs/Other Studies Reviewed:      EKG:  EKG is  ordered today.  The ekg ordered today demonstrates sinus rhythm 67 no other abnormalities  Recent Labs: No results found for requested labs within last 8760 hours.  Recent Lipid Panel    Component Value Date/Time   CHOL 184 05/09/2018 1057   TRIG 113.0 05/09/2018 1057   HDL 51.00 05/09/2018 1057   CHOLHDL 4 05/09/2018 1057   VLDL 22.6 05/09/2018 1057   LDLCALC 110 (H) 05/09/2018 1057   LDLDIRECT 137.2 12/14/2010 1118    Physical Exam:    VS:  BP 126/70   Pulse 67   Ht '5\' 4"'  (1.626 m)   Wt 154 lb (69.9 kg)   LMP  (LMP Unknown)   SpO2 98%   BMI 26.43 kg/m     Wt Readings from Last 3 Encounters:  06/26/19 154 lb (69.9 kg)  06/18/18 139 lb (63 kg)  05/09/18 142 lb 9.6 oz (64.7 kg)     GEN:  Well nourished, well developed in no acute distress HEENT: Normal NECK: No JVD; No carotid bruits LYMPHATICS: No lymphadenopathy CARDIAC: RRR, no murmurs, rubs, gallops RESPIRATORY:  Clear to auscultation without rales, wheezing or rhonchi  ABDOMEN: Soft, non-tender, non-distended MUSCULOSKELETAL:  No edema; No deformity  SKIN: Warm and dry NEUROLOGIC:  Alert and oriented  x 3 PSYCHIATRIC:  Normal affect   ASSESSMENT:    1. Palpitations   2. Essential hypertension    PLAN:    In order of problems listed above:  Palpitations -Continue to do well with Toprol-XL 50 mg.  No changes made.  EKG excellent.  Essential hypertension -On losartan, Toprol.  Doing well.  Medication Adjustments/Labs and Tests Ordered: Current medicines are reviewed at length with the patient today.  Concerns regarding medicines are outlined above.  Orders Placed This Encounter  Procedures  . EKG 12-Lead   No orders of the defined types were placed in this encounter.   Patient Instructions  Medication Instructions:  The current medical regimen is effective;  continue present plan and medications.  *If you need a refill on your cardiac medications before your next appointment, please call your pharmacy*  Follow-Up: At [facility], you and your health needs are our priority.  As part of our continuing mission to provide you with exceptional heart care, we have created designated Provider Care Teams.  These Care Teams include your primary Cardiologist (physician) and Advanced Practice Providers (APPs -  Physician Assistants and Nurse Practitioners) who all work together to provide you with the care you need, when you need it.  We recommend signing up for the patient portal called "MyChart".  Sign up information is provided on this After Visit Summary.  MyChart is used to connect with patients for Virtual Visits (Telemedicine).  Patients are able to view lab/test results, encounter notes, upcoming appointments, etc.  Non-urgent messages can be sent to your provider as well.   To learn more about what you can do with MyChart, go to [email or web address].    Your next appointment:   12 month(s)  The format for your next appointment:   In Person  Provider:   Candee Furbish, MD   Thank you for choosing [facility]!!        Signed, Candee Furbish, MD   06/26/2019 12:21 PM    [place]

## 2019-06-26 NOTE — Patient Instructions (Signed)
Medication Instructions:  The current medical regimen is effective;  continue present plan and medications.  *If you need a refill on your cardiac medications before your next appointment, please call your pharmacy*  Follow-Up: At [facility], you and your health needs are our priority.  As part of our continuing mission to provide you with exceptional heart care, we have created designated Provider Care Teams.  These Care Teams include your primary Cardiologist (physician) and Advanced Practice Providers (APPs -  Physician Assistants and Nurse Practitioners) who all work together to provide you with the care you need, when you need it.  We recommend signing up for the patient portal called "MyChart".  Sign up information is provided on this After Visit Summary.  MyChart is used to connect with patients for Virtual Visits (Telemedicine).  Patients are able to view lab/test results, encounter notes, upcoming appointments, etc.  Non-urgent messages can be sent to your provider as well.   To learn more about what you can do with MyChart, go to [email or web address].    Your next appointment:   12 month(s)  The format for your next appointment:   In Person  Provider:   Mark Skains, MD   Thank you for choosing [facility]!!      

## 2019-07-24 ENCOUNTER — Other Ambulatory Visit: Payer: Self-pay | Admitting: Internal Medicine

## 2019-08-01 DIAGNOSIS — R208 Other disturbances of skin sensation: Secondary | ICD-10-CM | POA: Diagnosis not present

## 2019-08-01 DIAGNOSIS — Z85828 Personal history of other malignant neoplasm of skin: Secondary | ICD-10-CM | POA: Diagnosis not present

## 2019-08-26 ENCOUNTER — Other Ambulatory Visit: Payer: Self-pay | Admitting: Internal Medicine

## 2019-08-27 ENCOUNTER — Other Ambulatory Visit: Payer: Self-pay | Admitting: Internal Medicine

## 2019-09-26 ENCOUNTER — Other Ambulatory Visit: Payer: Self-pay | Admitting: Internal Medicine

## 2019-10-07 ENCOUNTER — Other Ambulatory Visit: Payer: Self-pay | Admitting: Internal Medicine

## 2019-10-07 ENCOUNTER — Other Ambulatory Visit: Payer: Self-pay | Admitting: Cardiology

## 2019-10-17 IMAGING — DX DG CHEST 2V
2 series · 2 of 2 positions shown · non-contrast
Comparison: None.

CLINICAL DATA: Cough

EXAM:
CHEST - 2 VIEW

[chest pa]
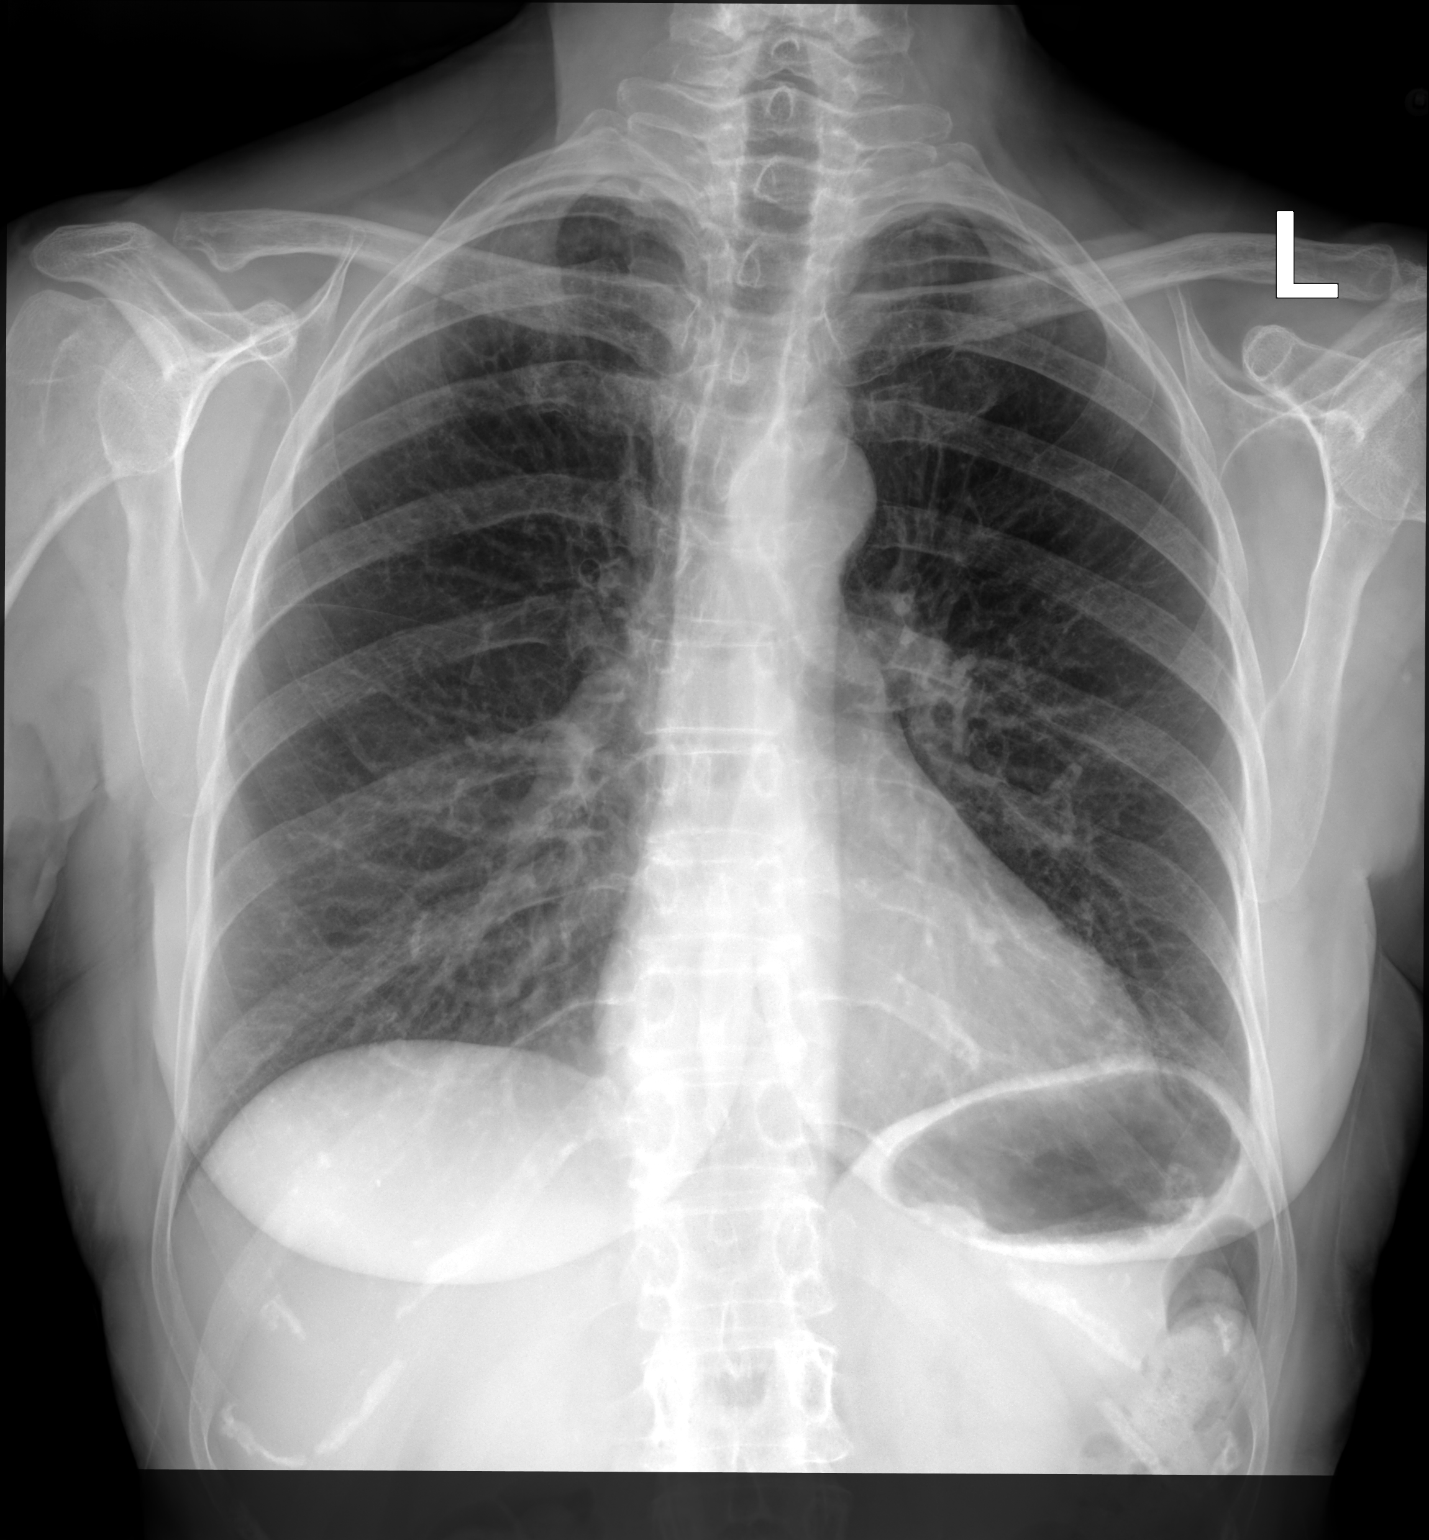

[chest lat]
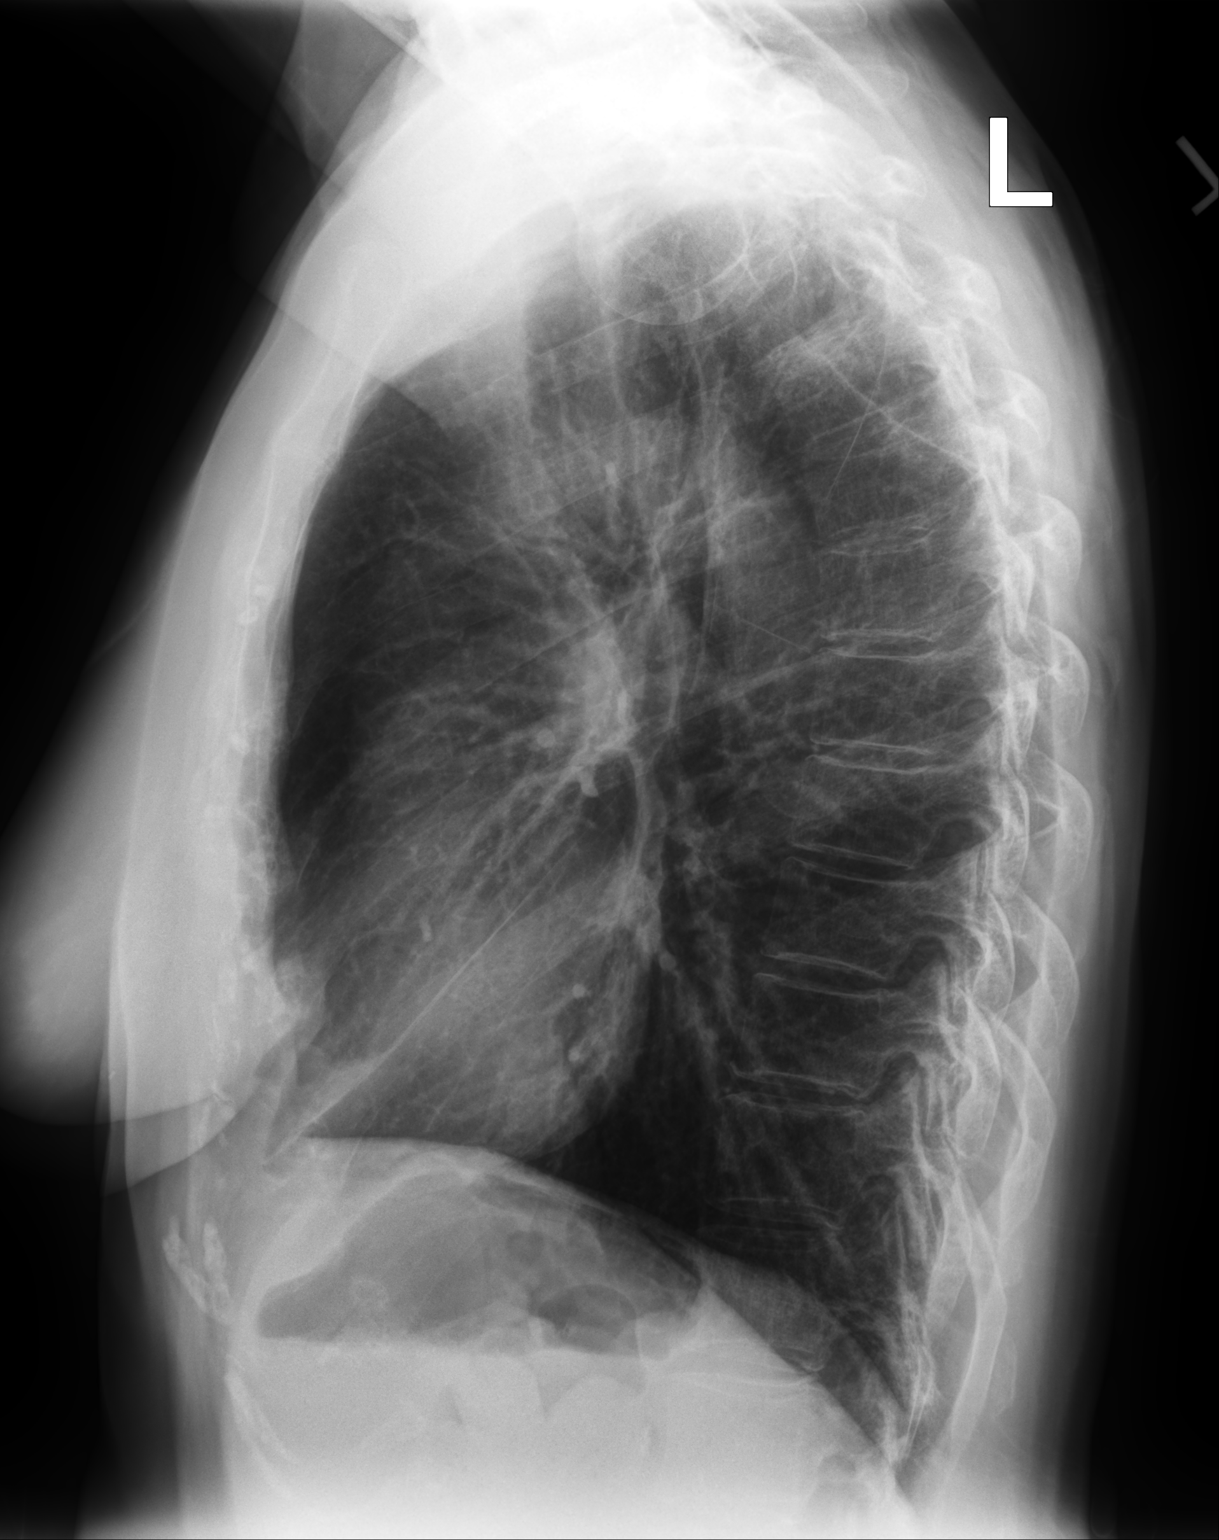

[2 of 2 positions shown; findings below may reference images not displayed]

FINDINGS: The heart size and mediastinal contours are within normal limits.
Both lungs are clear. The visualized skeletal structures are
unremarkable.
IMPRESSION: No active cardiopulmonary disease.
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
## 2019-10-18 ENCOUNTER — Ambulatory Visit (INDEPENDENT_AMBULATORY_CARE_PROVIDER_SITE_OTHER): Payer: Medicare Other | Admitting: Internal Medicine

## 2019-10-18 ENCOUNTER — Other Ambulatory Visit: Payer: Self-pay

## 2019-10-18 ENCOUNTER — Encounter: Payer: Self-pay | Admitting: Internal Medicine

## 2019-10-18 VITALS — BP 126/76 | HR 49 | Temp 97.6°F | Ht 63.5 in | Wt 151.6 lb

## 2019-10-18 DIAGNOSIS — F411 Generalized anxiety disorder: Secondary | ICD-10-CM

## 2019-10-18 DIAGNOSIS — E559 Vitamin D deficiency, unspecified: Secondary | ICD-10-CM | POA: Diagnosis not present

## 2019-10-18 DIAGNOSIS — Z79899 Other long term (current) drug therapy: Secondary | ICD-10-CM

## 2019-10-18 DIAGNOSIS — I10 Essential (primary) hypertension: Secondary | ICD-10-CM

## 2019-10-18 NOTE — Patient Instructions (Signed)
Glad  You are doing well.   Lab today  Continue lifestyle intervention healthy eating and exercise .   Flu shot in the fall .    Health Maintenance, Female Adopting a healthy lifestyle and getting preventive care are important in promoting health and wellness. Ask your health care provider about:  The right schedule for you to have regular tests and exams.  Things you can do on your own to prevent diseases and keep yourself healthy. What should I know about diet, weight, and exercise? Eat a healthy diet   Eat a diet that includes plenty of vegetables, fruits, low-fat dairy products, and lean protein.  Do not eat a lot of foods that are high in solid fats, added sugars, or sodium. Maintain a healthy weight Body mass index (BMI) is used to identify weight problems. It estimates body fat based on height and weight. Your health care provider can help determine your BMI and help you achieve or maintain a healthy weight. Get regular exercise Get regular exercise. This is one of the most important things you can do for your health. Most adults should:  Exercise for at least 150 minutes each week. The exercise should increase your heart rate and make you sweat (moderate-intensity exercise).  Do strengthening exercises at least twice a week. This is in addition to the moderate-intensity exercise.  Spend less time sitting. Even light physical activity can be beneficial. Watch cholesterol and blood lipids Have your blood tested for lipids and cholesterol at 75 years of age, then have this test every 5 years. Have your cholesterol levels checked more often if:  Your lipid or cholesterol levels are high.  You are older than 74 years of age.  You are at high risk for heart disease. What should I know about cancer screening? Depending on your health history and family history, you may need to have cancer screening at various ages. This may include screening for:  Breast  cancer.  Cervical cancer.  Colorectal cancer.  Skin cancer.  Lung cancer. What should I know about heart disease, diabetes, and high blood pressure? Blood pressure and heart disease  High blood pressure causes heart disease and increases the risk of stroke. This is more likely to develop in people who have high blood pressure readings, are of African descent, or are overweight.  Have your blood pressure checked: ? Every 3-5 years if you are 2-104 years of age. ? Every year if you are 53 years old or older. Diabetes Have regular diabetes screenings. This checks your fasting blood sugar level. Have the screening done:  Once every three years after age 55 if you are at a normal weight and have a low risk for diabetes.  More often and at a younger age if you are overweight or have a high risk for diabetes. What should I know about preventing infection? Hepatitis B If you have a higher risk for hepatitis B, you should be screened for this virus. Talk with your health care provider to find out if you are at risk for hepatitis B infection. Hepatitis C Testing is recommended for:  Everyone born from [birth year] through 1965.  Anyone with known risk factors for hepatitis C. Sexually transmitted infections (STIs)  Get screened for STIs, including gonorrhea and chlamydia, if: ? You are sexually active and are younger than 74 years of age. ? You are older than 74 years of age and your health care provider tells you that you are at risk for this  type of infection. ? Your sexual activity has changed since you were last screened, and you are at increased risk for chlamydia or gonorrhea. Ask your health care provider if you are at risk.  Ask your health care provider about whether you are at high risk for HIV. Your health care provider may recommend a prescription medicine to help prevent HIV infection. If you choose to take medicine to prevent HIV, you should first get tested for HIV. You should  then be tested every 3 months for as long as you are taking the medicine. Pregnancy  If you are about to stop having your period (premenopausal) and you may become pregnant, seek counseling before you get pregnant.  Take 400 to 800 micrograms (mcg) of folic acid every day if you become pregnant.  Ask for birth control (contraception) if you want to prevent pregnancy. Osteoporosis and menopause Osteoporosis is a disease in which the bones lose minerals and strength with aging. This can result in bone fractures. If you are 69 years old or older, or if you are at risk for osteoporosis and fractures, ask your health care provider if you should:  Be screened for bone loss.  Take a calcium or vitamin D supplement to lower your risk of fractures.  Be given hormone replacement therapy (HRT) to treat symptoms of menopause. Follow these instructions at home: Lifestyle  Do not use any products that contain nicotine or tobacco, such as cigarettes, e-cigarettes, and chewing tobacco. If you need help quitting, ask your health care provider.  Do not use street drugs.  Do not share needles.  Ask your health care provider for help if you need support or information about quitting drugs. Alcohol use  Do not drink alcohol if: ? Your health care provider tells you not to drink. ? You are pregnant, may be pregnant, or are planning to become pregnant.  If you drink alcohol: ? Limit how much you use to 0-1 drink a day. ? Limit intake if you are breastfeeding.  Be aware of how much alcohol is in your drink. In the [place] one drink equals one 12 oz bottle of beer (355 mL), one 5 oz glass of wine (148 mL), or one 1 oz glass of hard liquor (44 mL). General instructions  Schedule regular health, dental, and eye exams.  Stay current with your vaccines.  Tell your health care provider if: ? You often feel depressed. ? You have ever been abused or do not feel safe at home. Summary  Adopting a  healthy lifestyle and getting preventive care are important in promoting health and wellness.  Follow your health care provider's instructions about healthy diet, exercising, and getting tested or screened for diseases.  Follow your health care provider's instructions on monitoring your cholesterol and blood pressure. This information is not intended to replace advice given to you by your health care provider. Make sure you discuss any questions you have with your health care provider. Document Revised: 02/07/2018 Document Reviewed: 02/07/2018 Elsevier Patient Education  2020 [employer].

## 2019-10-19 LAB — CBC WITH DIFFERENTIAL/PLATELET
Absolute Monocytes: 592 cells/uL (ref 200–950)
Basophils Absolute: 50 cells/uL (ref 0–200)
Basophils Relative: 0.8 %
Eosinophils Absolute: 214 cells/uL (ref 15–500)
Eosinophils Relative: 3.4 %
HCT: 37.2 % (ref 35.0–45.0)
Hemoglobin: 12.2 g/dL (ref 11.7–15.5)
Lymphs Abs: 1600 cells/uL (ref 850–3900)
MCH: 30.7 pg (ref 27.0–33.0)
MCHC: 32.8 g/dL (ref 32.0–36.0)
MCV: 93.5 fL (ref 80.0–100.0)
MPV: 10 fL (ref 7.5–12.5)
Monocytes Relative: 9.4 %
Neutro Abs: 3843 cells/uL (ref 1500–7800)
Neutrophils Relative %: 61 %
Platelets: 306 10*3/uL (ref 140–400)
RBC: 3.98 10*6/uL (ref 3.80–5.10)
RDW: 12.5 % (ref 11.0–15.0)
Total Lymphocyte: 25.4 %
WBC: 6.3 10*3/uL (ref 3.8–10.8)

## 2019-10-19 LAB — BASIC METABOLIC PANEL WITH GFR
BUN: 13 mg/dL (ref 7–25)
CO2: 29 mmol/L (ref 20–32)
Calcium: 9.3 mg/dL (ref 8.6–10.4)
Chloride: 99 mmol/L (ref 98–110)
Creat: 0.79 mg/dL (ref 0.60–0.93)
GFR, Est African American: 85 mL/min/{1.73_m2} (ref >=60)
GFR, Est Non African American: 74 mL/min/{1.73_m2} (ref >=60)
Glucose, Bld: 91 mg/dL (ref 65–99)
Potassium: 4.7 mmol/L (ref 3.5–5.3)
Sodium: 135 mmol/L (ref 135–146)

## 2019-10-19 LAB — LIPID PANEL
Cholesterol: 203 mg/dL — ABNORMAL HIGH (ref <200)
HDL: 52 mg/dL (ref >=50)
LDL Cholesterol (Calc): 130 mg/dL (calc) — ABNORMAL HIGH
Non-HDL Cholesterol (Calc): 151 mg/dL (calc) — ABNORMAL HIGH (ref <130)
Total CHOL/HDL Ratio: 3.9 (calc) (ref <5.0)
Triglycerides: 104 mg/dL (ref <150)

## 2019-10-19 LAB — HEPATIC FUNCTION PANEL
AG Ratio: 2 (calc) (ref 1.0–2.5)
ALT: 13 U/L (ref 6–29)
AST: 15 U/L (ref 10–35)
Albumin: 4.5 g/dL (ref 3.6–5.1)
Alkaline phosphatase (APISO): 86 U/L (ref 37–153)
Bilirubin, Direct: 0.1 mg/dL (ref 0.0–0.2)
Globulin: 2.2 g/dL (calc) (ref 1.9–3.7)
Indirect Bilirubin: 0.5 mg/dL (calc) (ref 0.2–1.2)
Total Bilirubin: 0.6 mg/dL (ref 0.2–1.2)
Total Protein: 6.7 g/dL (ref 6.1–8.1)

## 2019-10-19 LAB — VITAMIN D 25 HYDROXY (VIT D DEFICIENCY, FRACTURES): Vit D, 25-Hydroxy: 22 ng/mL — ABNORMAL LOW (ref 30–100)

## 2019-10-22 NOTE — Progress Notes (Signed)
Cholesterol up some from  last time Vit d slightly low Attention to healthy eating and activity Increase vit d intake by 1000 mcg per day in addition to your current  regimen

## 2019-11-01 ENCOUNTER — Other Ambulatory Visit: Payer: Self-pay | Admitting: Internal Medicine

## 2019-11-06 ENCOUNTER — Other Ambulatory Visit: Payer: Self-pay | Admitting: Internal Medicine

## 2020-01-13 ENCOUNTER — Telehealth: Payer: Self-pay | Admitting: Internal Medicine

## 2020-01-13 NOTE — Telephone Encounter (Signed)
Left message for patient to call back and schedule Medicare Annual Wellness Visit (AWV) either virtually or in office.  Last AWV 02/24/16; please schedule at anytime with [facility] Nurse Health Advisor 2.  This should be a 45 minute visit.

## 2020-01-14 ENCOUNTER — Other Ambulatory Visit: Payer: Self-pay | Admitting: Cardiology

## 2020-01-15 DIAGNOSIS — Z23 Encounter for immunization: Secondary | ICD-10-CM | POA: Diagnosis not present

## 2020-01-30 DIAGNOSIS — Z23 Encounter for immunization: Secondary | ICD-10-CM | POA: Diagnosis not present

## 2020-02-09 ENCOUNTER — Other Ambulatory Visit: Payer: Self-pay | Admitting: Internal Medicine

## 2020-03-03 ENCOUNTER — Other Ambulatory Visit: Payer: Self-pay | Admitting: Internal Medicine

## 2020-03-03 NOTE — Telephone Encounter (Signed)
Last refill- 11-06-19...60 tabs with 2 refills Last office visit- 10/18/19

## 2020-04-07 ENCOUNTER — Encounter: Payer: Self-pay | Admitting: Internal Medicine

## 2020-04-08 ENCOUNTER — Telehealth: Payer: Self-pay | Admitting: Internal Medicine

## 2020-04-08 NOTE — Telephone Encounter (Signed)
Left message for patient to call back and schedule Medicare Annual Wellness Visit (AWV) either virtually or in office. Did not leave detailed message    Last AWV 02/14/16  please schedule at anytime with [facility] Nurse Health Advisor 1 or 2   This should be a 45 minute visit.

## 2020-06-04 ENCOUNTER — Other Ambulatory Visit: Payer: Self-pay | Admitting: Internal Medicine

## 2020-06-11 ENCOUNTER — Telehealth: Payer: Self-pay | Admitting: Internal Medicine

## 2020-06-11 NOTE — Telephone Encounter (Signed)
Left message for patient to call back and schedule Medicare Annual Wellness Visit (AWV) either virtually or in office.   Last AWV 02/24/16  please schedule at anytime with [facility] Nurse Health Advisor 1 or 2   This should be a 45 minute visit.

## 2020-07-03 ENCOUNTER — Other Ambulatory Visit: Payer: Self-pay | Admitting: Internal Medicine

## 2020-07-17 ENCOUNTER — Encounter: Payer: Self-pay | Admitting: Cardiology

## 2020-07-17 ENCOUNTER — Ambulatory Visit (INDEPENDENT_AMBULATORY_CARE_PROVIDER_SITE_OTHER): Payer: Medicare Other | Admitting: Cardiology

## 2020-07-17 ENCOUNTER — Other Ambulatory Visit: Payer: Self-pay

## 2020-07-17 VITALS — BP 120/70 | HR 64 | Ht 63.5 in | Wt 148.0 lb

## 2020-07-17 DIAGNOSIS — R002 Palpitations: Secondary | ICD-10-CM | POA: Diagnosis not present

## 2020-07-17 DIAGNOSIS — I10 Essential (primary) hypertension: Secondary | ICD-10-CM

## 2020-07-17 NOTE — Progress Notes (Signed)
Cardiology Office Note:    Date:  07/17/2020   ID:  Nicole Wood, DOB 04/08/1945, MRN [id number]  PCP:  Panosh, Wanda K, MD   [facility] Providers Cardiologist:  Mark Skains, MD     Referring MD: Panosh, Wanda K, MD    History of Present Illness:    Nicole Wood is a 75 y.o. female here for follow up of palpitations and hypertension.   Prior History: In review of prior note: Has history of palpitations. Woke up with HR 137 BPM. ?Event monitor.  He describes it as "jumpy heart". She was seen in the emergency department. Labile blood pressure. Has been under increased stress over summer 2015  In Feb 2015, Husband and she went to [place] for 3 weeks. Had 2 episodes of palpitaions. Mon, Fri. Each time ate grouper. First time while at hotel before sleep, relaxing, felt pounding heart. Feels like she has to get up and walk around. Lasted 3-5 min. Took one of husband's Xanax (On CHEMO). Did fine all week.  On Friday, evening, 7pm went to dinner, grouper sandwich. Glass of wine prior to leaving. Came home, felt very miserable and full. Got in bed, took TUMS, bloated. No Xanax that time. Breathing was fine. At 2am heart bolted her out bed. Walked around, took deep breath, no CP, no diaphoesis. Did not wake Marshall her husband up right away. Took Xanax. Scared. Calmed down. Finally went to sleep. Anxious.   Left Saturday went to [place]. Her husband wanted her to go to ER. 2 hr drive. Checked into hotel, heart started racing. Went to ER. HR was 133bpm. Tele. Looked good. Blood work and CXR OK. Grandson mental break.   ER MD talked about toxins in fish.   04/01/14-follow-up visit-she's been doing very well without palpitations. She is eager to start exercising once again. No chest pain, no shortness of breath. She has metoprolol to be taken on as-needed basis.  04/07/16 - woke up flush in the chest, heart racing. EMS called 137. Doing it early in AM. 100 bpm Ativan helps. No syncope, no  bleeding, no fevers, no orthopnea.  06/16/2017 - 02/28/17 - broke foot. 2/19 - palps, all over the place, BP cuff 150, but went down in 5-8 min. Takes metoprolol at bedtime because wakes her up sometimes. Deep breaths help. Not SOB, CP. Rushing in ear. Seeing nutrition. Bike at [employer].   Today, she is doing reasonably well. Her blood pressure is well controlled.  BP Readings from Last 3 Encounters:  07/17/20 120/70  10/18/19 126/76  06/26/19 126/70   She recently invested in an E-bike, she is super excited and has not had any complaints so far. She denies having any exertional shortness of breath, chest pain, tightness, or pressure. She has no lightheadedness, LE edema, syncopal episodes, PND, or orthopnea.   Past Medical History:  Diagnosis Date  . Basal cell cancer    nose  . Cystocele   . Depression    in the past  . Hematuria    idiopathic  . Hypertension   . Osteoarthritis   . Osteopenia    dexa 2009, -2.3 osteoporosis -2.5 hip 2011  . Urinary incontinence     Past Surgical History:  Procedure Laterality Date  . ABDOMINAL HYSTERECTOMY     fibroids and cyst right ovary left oopherectomy  . basal cell ca removed    . bladder tack    . BREAST BIOPSY Left    benign  . rt   shoulder surgery   2004   [place]  . URETHRAL DILATION      Current Medications: Current Meds  Medication Sig  . bifidobacterium infantis (ALIGN) capsule Take 1 capsule by mouth daily.  . Cholecalciferol (VITAMIN D3) 5000 units CAPS Take 1 capsule by mouth daily.  . citalopram (CELEXA) 10 MG tablet TAKE 1 TABLET(10 MG) BY MOUTH TWICE DAILY  . clobetasol cream (TEMOVATE) 0.05 % Apply topically daily.  Marland Kitchen LORazepam (ATIVAN) 0.5 MG tablet TAKE 1 TABLET(0.5 MG) BY MOUTH EVERY 8 HOURS AS NEEDED  . losartan (COZAAR) 50 MG tablet TAKE 1 TABLET(50 MG) BY MOUTH DAILY  . metoprolol succinate (TOPROL-XL) 50 MG 24 hr tablet TAKE 1 TABLET(50 MG) BY MOUTH AT BEDTIME  . Multiple Vitamins-Minerals (ICAPS PO)  Take 1 capsule by mouth 2 (two) times daily.   . naproxen sodium (ANAPROX) 220 MG tablet Take 220 mg by mouth 2 (two) times daily as needed (pain). ALEVE  . polyethylene glycol (MIRALAX / GLYCOLAX) packet Take 8.5 g by mouth daily.     Allergies:   Contrast media [iodinated diagnostic agents], Lisinopril, and Penicillins   Social History   Socioeconomic History  . Marital status: Married    Spouse name: Not on file  . Number of children: Not on file  . Years of education: Not on file  . Highest education level: Not on file  Occupational History  . Not on file  Tobacco Use  . Smoking status: Never Smoker  . Smokeless tobacco: Never Used  Vaping Use  . Vaping Use: Never used  Substance and Sexual Activity  . Alcohol use: Yes  . Drug use: No  . Sexual activity: Yes    Partners: Female  Other Topics Concern  . Not on file  Social History Narrative   Retired Arts development officer) some college   Married 4 children [place],, [place], and [place], and [place] Husband cancer MM. NHL in remission   Regular exercise- yes   HH of 2   No pets    Multiple myeloma and non hogkins lymphoma in husband x 6 years   Regular exercise: walk, swim, some biking   Caffeine use: coffee daily, sweet tea   Social Determinants of Health   Financial Resource Strain: Not on file  Food Insecurity: Not on file  Transportation Needs: Not on file  Physical Activity: Not on file  Stress: Not on file  Social Connections: Not on file     Family History: The patient's family history includes Arthritis in her brother and brother; Breast cancer in her maternal aunt; Cancer in her father; Depression in her mother; Diabetes in her brother; Hip fracture in her mother; Hyperlipidemia in her brother; Hypertension in her mother; Pulmonary embolism in her mother; Stroke in her maternal aunt. There is no history of Heart attack.  ROS:   Please see the history of present illness. All other systems reviewed and are  negative.  EKGs/Labs/Other Studies Reviewed:    The following studies were reviewed today: Cardiac Telemetry Monitoring 04/11/16:  Sinus rhythm, no AFIB.  No adverse rhythms.  Reassuring  EKG:   07/17/20-sinus rythym, rate 64, leftward axis      Recent Labs: 10/18/2019: ALT 13; BUN 13; Creat 0.79; Hemoglobin 12.2; Platelets 306; Potassium 4.7; Sodium 135  Recent Lipid Panel    Component Value Date/Time   CHOL 203 (H) 10/18/2019 1045   TRIG 104 10/18/2019 1045   HDL 52 10/18/2019 1045   CHOLHDL 3.9 10/18/2019 1045   VLDL  22.6 05/09/2018 1057   LDLCALC 130 (H) 10/18/2019 1045   LDLDIRECT 137.2 12/14/2010 1118     Risk Assessment/Calculations:      Physical Exam:    VS:  BP 120/70 (BP Location: Left Arm, Patient Position: Sitting, Cuff Size: Normal)   Pulse 64   Ht 5' 3.5" (1.613 m)   Wt 148 lb (67.1 kg)   LMP  (LMP Unknown)   SpO2 98%   BMI 25.81 kg/m     Wt Readings from Last 3 Encounters:  07/17/20 148 lb (67.1 kg)  10/18/19 151 lb 9.6 oz (68.8 kg)  06/26/19 154 lb (69.9 kg)     GEN: Well nourished, well developed in no acute distress HEENT: Normal NECK: No JVD; No carotid bruits LYMPHATICS: No lymphadenopathy CARDIAC: RRR, no murmurs, rubs, gallops RESPIRATORY:  Clear to auscultation without rales, wheezing or rhonchi  ABDOMEN: Soft, non-tender, non-distended MUSCULOSKELETAL:  No edema; No deformity  SKIN: Warm and dry NEUROLOGIC:  Alert and oriented x 3 PSYCHIATRIC:  Normal affect   ASSESSMENT:    1. Palpitations   2. Essential hypertension    PLAN:    In order of problems listed above:  Palpitations - Currently on Toprol-XL 50 mg.  No changes made.  Doing well.  EKG today reassuring.  Enjoying her E bike.  Her Toprol seems to be controlling it very well.  Essential hypertension continuing with losartan 50 mg once a day as well as Toprol 50 mg at bedtime.  He with current prescription drug management, refills as needed   1 yr follow  up  Medication Adjustments/Labs and Tests Ordered: Current medicines are reviewed at length with the patient today.  Concerns regarding medicines are outlined above.  Orders Placed This Encounter  Procedures  . EKG 12-Lead   No orders of the defined types were placed in this encounter.   Patient Instructions  Medication Instructions:  The current medical regimen is effective;  continue present plan and medications.  *If you need a refill on your cardiac medications before your next appointment, please call your pharmacy*  Follow-Up: At [facility], you and your health needs are our priority.  As part of our continuing mission to provide you with exceptional heart care, we have created designated Provider Care Teams.  These Care Teams include your primary Cardiologist (physician) and Advanced Practice Providers (APPs -  Physician Assistants and Nurse Practitioners) who all work together to provide you with the care you need, when you need it.  We recommend signing up for the patient portal called "MyChart".  Sign up information is provided on this After Visit Summary.  MyChart is used to connect with patients for Virtual Visits (Telemedicine).  Patients are able to view lab/test results, encounter notes, upcoming appointments, etc.  Non-urgent messages can be sent to your provider as well.   To learn more about what you can do with MyChart, go to [email or web address].    Your next appointment:   12 month(s)  The format for your next appointment:   In Person  Provider:   Candee Furbish, MD   Thank you for choosing [facility]!!         I,Alexis Bryant,acting as a scribe for Candee Furbish, MD.,have documented all relevant documentation on the behalf of Candee Furbish, MD,as directed by  Candee Furbish, MD while in the presence of Candee Furbish, MD.   I, Candee Furbish, MD, have reviewed all documentation for this visit. The documentation on 07/17/20 for the exam,  diagnosis,  procedures, and orders are all accurate and complete.  Signed, Mark Skains, MD  07/17/2020 11:56 AM    [facility] 

## 2020-07-17 NOTE — Patient Instructions (Signed)
Medication Instructions:  The current medical regimen is effective;  continue present plan and medications.  *If you need a refill on your cardiac medications before your next appointment, please call your pharmacy*  Follow-Up: At [facility], you and your health needs are our priority.  As part of our continuing mission to provide you with exceptional heart care, we have created designated Provider Care Teams.  These Care Teams include your primary Cardiologist (physician) and Advanced Practice Providers (APPs -  Physician Assistants and Nurse Practitioners) who all work together to provide you with the care you need, when you need it.  We recommend signing up for the patient portal called "MyChart".  Sign up information is provided on this After Visit Summary.  MyChart is used to connect with patients for Virtual Visits (Telemedicine).  Patients are able to view lab/test results, encounter notes, upcoming appointments, etc.  Non-urgent messages can be sent to your provider as well.   To learn more about what you can do with MyChart, go to [email or web address].    Your next appointment:   12 month(s)  The format for your next appointment:   In Person  Provider:   Mark Skains, MD   Thank you for choosing [facility]!!      

## 2020-08-13 ENCOUNTER — Telehealth: Payer: Self-pay | Admitting: Internal Medicine

## 2020-08-13 NOTE — Telephone Encounter (Signed)
Left message for patient to call back and schedule Medicare Annual Wellness Visit (AWV) either virtually or in office.   Last AWV  02/24/16 ; please schedule at anytime with [facility] Nurse Health Advisor 1 or 2   This should be a 45 minute visit.

## 2020-09-01 ENCOUNTER — Other Ambulatory Visit: Payer: Self-pay | Admitting: Internal Medicine

## 2020-09-10 ENCOUNTER — Telehealth: Payer: Self-pay | Admitting: Internal Medicine

## 2020-09-10 NOTE — Telephone Encounter (Signed)
Left message for patient to call back and schedule Medicare Annual Wellness Visit (AWV) either virtually or in office.   Last AWV 02/24/16  please schedule at anytime with [facility] Nurse Health Advisor 1 or 2   This should be a 45 minute visit.

## 2020-10-02 ENCOUNTER — Telehealth: Payer: Self-pay | Admitting: Internal Medicine

## 2020-10-02 NOTE — Telephone Encounter (Signed)
Left message for patient to call back and schedule Medicare Annual Wellness Visit (AWV) either virtually or in office. Left both  my jabber number [phone] and office number    Last AWV 02/24/16  please schedule at anytime with [facility] Nurse Health Advisor 1 or 2   This should be a 45 minute visit.

## 2020-10-05 ENCOUNTER — Other Ambulatory Visit: Payer: Self-pay

## 2020-10-05 MED ORDER — LOSARTAN POTASSIUM 50 MG PO TABS
ORAL_TABLET | ORAL | 3 refills | Status: DC
Start: 2020-10-05 — End: 2021-02-04

## 2020-10-31 ENCOUNTER — Other Ambulatory Visit: Payer: Self-pay | Admitting: Internal Medicine

## 2020-11-12 ENCOUNTER — Telehealth: Payer: Self-pay | Admitting: Internal Medicine

## 2020-11-12 NOTE — Telephone Encounter (Signed)
Left message for patient to call back and schedule Medicare Annual Wellness Visit (AWV) either virtually or in office. Left  my Nicole Wood number [phone]   Last AWV ;02/24/16  please schedule at anytime with [facility] Nurse Health Advisor 1 or 2   This should be a 45 minute visit.

## 2020-11-30 ENCOUNTER — Other Ambulatory Visit: Payer: Self-pay | Admitting: Internal Medicine

## 2020-12-02 ENCOUNTER — Other Ambulatory Visit: Payer: Self-pay | Admitting: Internal Medicine

## 2020-12-02 DIAGNOSIS — Z1231 Encounter for screening mammogram for malignant neoplasm of breast: Secondary | ICD-10-CM

## 2020-12-03 ENCOUNTER — Ambulatory Visit
Admission: RE | Admit: 2020-12-03 | Discharge: 2020-12-03 | Disposition: A | Discharge status: Home / Self Care | Payer: Medicare Other | Source: Ambulatory Visit | Attending: Internal Medicine | Admitting: Internal Medicine

## 2020-12-03 ENCOUNTER — Other Ambulatory Visit: Payer: Self-pay

## 2020-12-03 DIAGNOSIS — Z1231 Encounter for screening mammogram for malignant neoplasm of breast: Secondary | ICD-10-CM | POA: Diagnosis not present

## 2020-12-07 ENCOUNTER — Other Ambulatory Visit: Payer: Self-pay

## 2020-12-07 MED ORDER — CITALOPRAM HYDROBROMIDE 10 MG PO TABS: ORAL_TABLET | ORAL | 2 refills | Status: DC

## 2020-12-11 ENCOUNTER — Other Ambulatory Visit: Payer: Self-pay

## 2020-12-14 ENCOUNTER — Ambulatory Visit (INDEPENDENT_AMBULATORY_CARE_PROVIDER_SITE_OTHER): Payer: Medicare Other

## 2020-12-14 ENCOUNTER — Other Ambulatory Visit: Payer: Self-pay

## 2020-12-14 DIAGNOSIS — Z23 Encounter for immunization: Secondary | ICD-10-CM

## 2021-01-04 ENCOUNTER — Other Ambulatory Visit: Payer: Self-pay | Admitting: Cardiology

## 2021-01-24 DIAGNOSIS — Z23 Encounter for immunization: Secondary | ICD-10-CM | POA: Diagnosis not present

## 2021-02-04 ENCOUNTER — Other Ambulatory Visit: Payer: Self-pay | Admitting: Internal Medicine

## 2021-02-15 NOTE — Progress Notes (Signed)
Chief Complaint  Patient presents with   Medication Management   Hypertension    HPI: Patient  Nicole Wood  75 y.o. comes in today for yearly  visit and med check  Blood pressure has been controlled when she takes reading and no concerns 135 at dentist .  Does monitor her pulse tends to get the rapid palpitations mostly at night.  Sees Dr. Marlou Porch yearly.  Is on metoprolol and losartan. Anxiety seems to be good on the citalopram Rare use of ativan  To do colon  in 2023  History of osteoporosis by bone density 2014 she did see endocrinologist and decided not to take medication.  No fracture does do exercise now has any bike wants to get out more.    Health Maintenance  Topic Date Due   TETANUS/TDAP  02/28/2018   COLONOSCOPY (Pts 45-18yr Insurance coverage will need to be confirmed)  08/17/2021 (Originally 12/02/2019)   Zoster Vaccines- Shingrix (1 of 2) 08/17/2021 (Originally 03/03/1964)   COVID-19 Vaccine (4 - Booster for Pfizer series) 03/25/2021   Pneumonia Vaccine 75 Years old  Completed   INFLUENZA VACCINE  Completed   DEXA SCAN  Completed   Hepatitis C Screening  Completed   HPV VACCINES  Aged Out   Health Maintenance Review LIFESTYLE:  Exercise:   [place] much activity Tobacco/ETS: no Alcohol:   no Sugar beverages:  ocass  Sleep: about 7- 8  Drug use: no HH of   2   no pets    ROS:  REST of 12 system review negative except as per HPI   Past Medical History:  Diagnosis Date   Basal cell cancer    nose   Cystocele    Depression    in the past   Hematuria    idiopathic   Hypertension    Osteoarthritis    Osteopenia    dexa 2009, -2.3 osteoporosis -2.5 hip 2011   Urinary incontinence     Past Surgical History:  Procedure Laterality Date   ABDOMINAL HYSTERECTOMY     fibroids and cyst right ovary left oopherectomy   basal cell ca removed     bladder tack     BREAST BIOPSY Left    benign   [place]     Family History  Problem Relation Age of Onset   Pulmonary embolism Mother    Depression Mother        major depressive disorder   Hip fracture Mother        about 720   Hypertension Mother    Cancer Father        bladder   Diabetes Brother    Hyperlipidemia Brother    Arthritis Brother        DJD   Arthritis Brother    Stroke Maternal Aunt    Breast cancer Maternal Aunt    Heart attack Neg Hx     Social History   Socioeconomic History   Marital status: Married    Spouse name: Not on file   Number of children: Not on file   Years of education: Not on file   Highest education level: Not on file  Occupational History   Not on file  Tobacco Use   Smoking status: Never   Smokeless tobacco: Never  Vaping Use   Vaping Use: Never used  Substance and Sexual Activity  Alcohol use: Yes   Drug use: No   Sexual activity: Yes    Partners: Female  Other Topics Concern   Not on file  Social History Narrative   Retired Arts development officer) some college   Married 4 children [place],, [place], and [place], and [place] Husband cancer MM. NHL in remission   Regular exercise- yes   HH of 2   No pets    Multiple myeloma and non hogkins lymphoma in husband x 6 years   Regular exercise: walk, swim, some biking   Caffeine use: coffee daily, sweet tea   Social Determinants of Health   Financial Resource Strain: Not on file  Food Insecurity: Not on file  Transportation Needs: Not on file  Physical Activity: Not on file  Stress: Not on file  Social Connections: Not on file    Outpatient Medications Prior to Visit  Medication Sig Dispense Refill   bifidobacterium infantis (ALIGN) capsule Take 1 capsule by mouth daily.     Cholecalciferol (VITAMIN D3) 5000 units CAPS Take 1 capsule by mouth daily.     citalopram (CELEXA) 10 MG tablet TAKE 1 TABLET(10 MG) BY MOUTH TWICE DAILY 60 tablet 2   LORazepam (ATIVAN) 0.5 MG tablet TAKE 1 TABLET(0.5 MG) BY MOUTH  EVERY 8 HOURS AS NEEDED 24 tablet 0   losartan (COZAAR) 50 MG tablet TAKE 1 TABLET(50 MG) BY MOUTH DAILY 30 tablet 0   metoprolol succinate (TOPROL-XL) 50 MG 24 hr tablet TAKE 1 TABLET(50 MG) BY MOUTH AT BEDTIME 90 tablet 1   Multiple Vitamins-Minerals (ICAPS PO) Take 1 capsule by mouth 2 (two) times daily.      naproxen sodium (ANAPROX) 220 MG tablet Take 220 mg by mouth 2 (two) times daily as needed (pain). ALEVE     polyethylene glycol (MIRALAX / GLYCOLAX) packet Take 8.5 g by mouth daily.     clobetasol cream (TEMOVATE) 0.05 % Apply topically daily.     No facility-administered medications prior to visit.     EXAM:  BP 126/76 (BP Location: Left Arm, Patient Position: Sitting, Cuff Size: Normal)    Pulse 61    Temp 97.9 F (36.6 C) (Oral)    Ht 5' 3.5" (1.613 m)    Wt 150 lb 9.6 oz (68.3 kg)    LMP  (LMP Unknown)    SpO2 98%    BMI 26.26 kg/m   Body mass index is 26.26 kg/m. Wt Readings from Last 3 Encounters:  02/16/21 150 lb 9.6 oz (68.3 kg)  07/17/20 148 lb (67.1 kg)  10/18/19 151 lb 9.6 oz (68.8 kg)    Physical Exam: Vital signs reviewed VXY:IAXK is a well-developed well-nourished alert cooperative    who appearsr stated age in no acute distress.  HEENT: normocephalic atraumatic , Eyes: PERRL EOM's full, conjunctiva clear, Nares: paten,t no deformity discharge or tenderness., Ears: no deformity EAC's clear TMs with normal landmarks. Mouth: masked NECK: supple without masses, thyromegaly or bruits. CHEST/PULM:  Clear to auscultation and percussion breath sounds equal no wheeze , rales or rhonchi. No chest wall deformities or tenderness. Breast: normal by inspection . No dimpling, discharge, masses, tenderness or discharge . CV: PMI is nondisplaced, S1 S2 no gallops, murmurs, rubs. Peripheral pulses are full without delay.No JVD .  ABDOMEN: Bowel sounds normal nontender  No guard or rebound, no hepato splenomegal no CVA tenderness.   Extremtities:  No clubbing cyanosis or  edema, no acute joint swelling or redness no focal atrophy NEURO:  Oriented x3, cranial  nerves 3-12 appear to be intact, no obvious focal weakness,gait within normal limits no abnormal reflexes or asymmetrical SKIN: No acute rashes normal turgor, color, no bruising or petechiae. PSYCH: Oriented, good eye contact, no obvious depression anxiety, cognition and judgment appear normal. LN: no cervical axillary inguinal adenopathy  Lab Results  Component Value Date   WBC 6.3 10/18/2019   HGB 12.2 10/18/2019   HCT 37.2 10/18/2019   PLT 306 10/18/2019   GLUCOSE 91 10/18/2019   CHOL 203 (H) 10/18/2019   TRIG 104 10/18/2019   HDL 52 10/18/2019   LDLDIRECT 137.2 12/14/2010   LDLCALC 130 (H) 10/18/2019   ALT 13 10/18/2019   AST 15 10/18/2019   NA 135 10/18/2019   K 4.7 10/18/2019   CL 99 10/18/2019   CREATININE 0.79 10/18/2019   BUN 13 10/18/2019   CO2 29 10/18/2019   TSH 3.20 05/04/2017   HGBA1C 5.6 01/14/2015    BP Readings from Last 3 Encounters:  02/16/21 126/76  07/17/20 120/70  10/18/19 126/76    Lab plan is fasting reviewed with patient    ASSESSMENT AND PLAN:  Discussed the following assessment and plan:    ICD-10-CM   1. Essential hypertension  M08 Basic metabolic panel    CBC with Differential/Platelet    Hepatic function panel    Lipid panel    TSH    VITAMIN D 25 Hydroxy (Vit-D Deficiency, Fractures)    VITAMIN D 25 Hydroxy (Vit-D Deficiency, Fractures)    TSH    Lipid panel    Hepatic function panel    CBC with Differential/Platelet    Basic metabolic panel    2. Medication management  Q76.195 Basic metabolic panel    CBC with Differential/Platelet    Hepatic function panel    Lipid panel    TSH    VITAMIN D 25 Hydroxy (Vit-D Deficiency, Fractures)    VITAMIN D 25 Hydroxy (Vit-D Deficiency, Fractures)    TSH    Lipid panel    Hepatic function panel    CBC with Differential/Platelet    Basic metabolic panel    3. Anxiety state  K93.2 Basic  metabolic panel    CBC with Differential/Platelet    Hepatic function panel    Lipid panel    TSH    VITAMIN D 25 Hydroxy (Vit-D Deficiency, Fractures)    VITAMIN D 25 Hydroxy (Vit-D Deficiency, Fractures)    TSH    Lipid panel    Hepatic function panel    CBC with Differential/Platelet    Basic metabolic panel    4. Postmenopausal osteoporosis  I71.2 Basic metabolic panel    CBC with Differential/Platelet    Hepatic function panel    Lipid panel    TSH    VITAMIN D 25 Hydroxy (Vit-D Deficiency, Fractures)    DG Bone Density    VITAMIN D 25 Hydroxy (Vit-D Deficiency, Fractures)    TSH    Lipid panel    Hepatic function panel    CBC with Differential/Platelet    Basic metabolic panel    5. Vitamin D deficiency  W58.0 Basic metabolic panel    CBC with Differential/Platelet    Hepatic function panel    Lipid panel    TSH    VITAMIN D 25 Hydroxy (Vit-D Deficiency, Fractures)    VITAMIN D 25 Hydroxy (Vit-D Deficiency, Fractures)    TSH    Lipid panel    Hepatic function panel    CBC with Differential/Platelet  Basic metabolic panel    6. Fam hx-osteoporosis  U72.76 Basic metabolic panel    CBC with Differential/Platelet    Hepatic function panel    Lipid panel    TSH    VITAMIN D 25 Hydroxy (Vit-D Deficiency, Fractures)    VITAMIN D 25 Hydroxy (Vit-D Deficiency, Fractures)    TSH    Lipid panel    Hepatic function panel    CBC with Differential/Platelet    Basic metabolic panel    Work monitoring indicated Discussed getting updated bone density and she can review her decision of whether to go on preventive medication or not.  Continue weightbearing exercise. Return in about 1 year (around 02/16/2022) for yearly and lab monitoring      othewise get DEXA scan in interim  for osteoprosis monitoring.  Patient Care Team: Endiya Klahr, Nicole Brooking, MD as PCP - General Jerline Pain, MD as PCP - Cardiology (Cardiology) Rolm Bookbinder, MD (Dermatology) Frederik Schmidt, MD as  Attending Physician (Oral Surgery) Jerline Pain, MD as Consulting Physician (Cardiology) Patient Instructions  Good to see you today .   Make appt for bone density ( order in sytem)   Glad you are doing well.   Continue lifestyle intervention healthy eating and exercise .  Will notify you  of labs when available.    Nicole Wood. Tarvares Lant M.D.

## 2021-02-16 ENCOUNTER — Ambulatory Visit (INDEPENDENT_AMBULATORY_CARE_PROVIDER_SITE_OTHER): Payer: Medicare Other | Admitting: Internal Medicine

## 2021-02-16 ENCOUNTER — Encounter: Payer: Self-pay | Admitting: Internal Medicine

## 2021-02-16 VITALS — BP 126/76 | HR 61 | Temp 97.9°F | Ht 63.5 in | Wt 150.6 lb

## 2021-02-16 DIAGNOSIS — Z79899 Other long term (current) drug therapy: Secondary | ICD-10-CM

## 2021-02-16 DIAGNOSIS — I10 Essential (primary) hypertension: Secondary | ICD-10-CM | POA: Diagnosis not present

## 2021-02-16 DIAGNOSIS — M810 Age-related osteoporosis without current pathological fracture: Secondary | ICD-10-CM | POA: Diagnosis not present

## 2021-02-16 DIAGNOSIS — E559 Vitamin D deficiency, unspecified: Secondary | ICD-10-CM

## 2021-02-16 DIAGNOSIS — F411 Generalized anxiety disorder: Secondary | ICD-10-CM

## 2021-02-16 DIAGNOSIS — Z8262 Family history of osteoporosis: Secondary | ICD-10-CM | POA: Diagnosis not present

## 2021-02-16 LAB — CBC WITH DIFFERENTIAL/PLATELET
Basophils Absolute: 0 10*3/uL (ref 0.0–0.1)
Basophils Relative: 0.6 % (ref 0.0–3.0)
Eosinophils Absolute: 0.4 10*3/uL (ref 0.0–0.7)
Eosinophils Relative: 4.9 % (ref 0.0–5.0)
HCT: 36.8 % (ref 36.0–46.0)
Hemoglobin: 12.1 g/dL (ref 12.0–15.0)
Lymphocytes Relative: 23.4 % (ref 12.0–46.0)
Lymphs Abs: 1.7 10*3/uL (ref 0.7–4.0)
MCHC: 32.9 g/dL (ref 30.0–36.0)
MCV: 94.2 fl (ref 78.0–100.0)
Monocytes Absolute: 0.8 10*3/uL (ref 0.1–1.0)
Monocytes Relative: 10.2 % (ref 3.0–12.0)
Neutro Abs: 4.5 10*3/uL (ref 1.4–7.7)
Neutrophils Relative %: 60.9 % (ref 43.0–77.0)
Platelets: 281 10*3/uL (ref 150.0–400.0)
RBC: 3.91 Mil/uL (ref 3.87–5.11)
RDW: 13.7 % (ref 11.5–15.5)
WBC: 7.4 10*3/uL (ref 4.0–10.5)

## 2021-02-16 LAB — HEPATIC FUNCTION PANEL
ALT: 11 U/L (ref 0–35)
AST: 15 U/L (ref 0–37)
Albumin: 4.2 g/dL (ref 3.5–5.2)
Alkaline Phosphatase: 81 U/L (ref 39–117)
Bilirubin, Direct: 0.1 mg/dL (ref 0.0–0.3)
Total Bilirubin: 0.5 mg/dL (ref 0.2–1.2)
Total Protein: 7 g/dL (ref 6.0–8.3)

## 2021-02-16 LAB — BASIC METABOLIC PANEL
BUN: 16 mg/dL (ref 6–23)
CO2: 31 mEq/L (ref 19–32)
Calcium: 9.6 mg/dL (ref 8.4–10.5)
Chloride: 101 mEq/L (ref 96–112)
Creatinine, Ser: 0.79 mg/dL (ref 0.40–1.20)
GFR: 72.9 mL/min (ref >60.00)
Glucose, Bld: 82 mg/dL (ref 70–99)
Potassium: 4.4 mEq/L (ref 3.5–5.1)
Sodium: 136 mEq/L (ref 135–145)

## 2021-02-16 LAB — LIPID PANEL
Cholesterol: 194 mg/dL (ref 0–200)
HDL: 48.1 mg/dL (ref >39.00)
LDL Cholesterol: 123 mg/dL — ABNORMAL HIGH (ref 0–99)
NonHDL: 145.43
Total CHOL/HDL Ratio: 4
Triglycerides: 114 mg/dL (ref 0.0–149.0)
VLDL: 22.8 mg/dL (ref 0.0–40.0)

## 2021-02-16 LAB — TSH: TSH: 2.85 u[IU]/mL (ref 0.35–5.50)

## 2021-02-16 LAB — VITAMIN D 25 HYDROXY (VIT D DEFICIENCY, FRACTURES): VITD: 23.45 ng/mL — ABNORMAL LOW (ref 30.00–100.00)

## 2021-02-16 NOTE — Patient Instructions (Addendum)
Good to see you today .   Make appt for bone density ( order in sytem)   Glad you are doing well.   Continue lifestyle intervention healthy eating and exercise .  Will notify you  of labs when available.

## 2021-02-17 NOTE — Progress Notes (Signed)
Liver blood count thyroid kidney tests normal ;cholesterol some better than last year. Vit d still somewhat low . Make sure taking vit d and increase by 1000 iu per day as possible of Vit D

## 2021-03-03 ENCOUNTER — Other Ambulatory Visit: Payer: Self-pay | Admitting: Internal Medicine

## 2021-03-05 ENCOUNTER — Other Ambulatory Visit: Payer: Self-pay | Admitting: Internal Medicine

## 2021-03-30 ENCOUNTER — Other Ambulatory Visit: Payer: Self-pay | Admitting: Internal Medicine

## 2021-04-21 ENCOUNTER — Encounter: Payer: Self-pay | Admitting: Internal Medicine

## 2021-04-21 ENCOUNTER — Telehealth: Payer: Self-pay

## 2021-04-21 ENCOUNTER — Other Ambulatory Visit: Payer: Self-pay

## 2021-04-21 ENCOUNTER — Other Ambulatory Visit: Payer: Self-pay | Admitting: Internal Medicine

## 2021-04-21 MED ORDER — LOSARTAN POTASSIUM 50 MG PO TABS: 50.0000 mg | ORAL_TABLET | Freq: Every day | ORAL | 0 refills | Status: DC

## 2021-05-03 ENCOUNTER — Ambulatory Visit (INDEPENDENT_AMBULATORY_CARE_PROVIDER_SITE_OTHER): Payer: Medicare Other

## 2021-05-03 VITALS — Ht 64.0 in | Wt 150.0 lb

## 2021-05-03 DIAGNOSIS — Z0000 Encounter for general adult medical examination without abnormal findings: Secondary | ICD-10-CM

## 2021-05-03 NOTE — Progress Notes (Signed)
I connected with  [name] today via telehealth video enabled device and verified that I am speaking with the correct person using two identifiers.   Location: Patient: home Provider: work  Persons participating in virtual visit: Katheren Jimmerson, Glenna Durand LPN  I discussed the limitations, risks, security and privacy concerns of performing an evaluation and management service by video and the availability of in person appointments. The patient expressed understanding and agreed to proceed.   Some vital signs may be absent or patient reported.     Subjective:   Nicole Wood is a 76 y.o. female who presents for Medicare Annual (Subsequent) preventive examination.  Review of Systems     Cardiac Risk Factors include: advanced age (>33mn, >>77women);hypertension     Objective:    Today's Vitals   05/03/21 1035  Weight: 150 lb (68 kg)  Height: _0  (1.626 m)   Body mass index is 25.75 kg/m.  Advanced Directives 05/03/2021 02/04/2018 12/02/2014  Does Patient Have a Medical Advance Directive? No No No  Would patient like information on creating a medical advance directive? - No - Patient declined No - patient declined information    Current Medications (verified) Outpatient Encounter Medications as of 05/03/2021  Medication Sig   bifidobacterium infantis (ALIGN) capsule Take 1 capsule by mouth daily.   Cholecalciferol (VITAMIN D3) 5000 units CAPS Take 1 capsule by mouth daily.   citalopram (CELEXA) 10 MG tablet TAKE 1 TABLET(10 MG) BY MOUTH TWICE DAILY   LORazepam (ATIVAN) 0.5 MG tablet TAKE 1 TABLET(0.5 MG) BY MOUTH EVERY 8 HOURS AS NEEDED   losartan (COZAAR) 50 MG tablet Take 1 tablet (50 mg total) by mouth daily.   metoprolol succinate (TOPROL-XL) 50 MG 24 hr tablet TAKE 1 TABLET(50 MG) BY MOUTH AT BEDTIME   Multiple Vitamins-Minerals (ICAPS PO) Take 1 capsule by mouth 2 (two) times daily.    naproxen sodium (ANAPROX) 220 MG tablet Take 220 mg by mouth 2 (two) times  daily as needed (pain). ALEVE   polyethylene glycol (MIRALAX / GLYCOLAX) packet Take 8.5 g by mouth daily.   No facility-administered encounter medications on file as of 05/03/2021.    Allergies (verified) Contrast media [iodinated contrast media], Lisinopril, and Penicillins   History: Past Medical History:  Diagnosis Date   Basal cell cancer    nose   Cystocele    Depression    in the past   Hematuria    idiopathic   Hypertension    Osteoarthritis    Osteopenia    dexa 2009, -2.3 osteoporosis -2.5 hip 2011   Urinary incontinence    Past Surgical History:  Procedure Laterality Date   ABDOMINAL HYSTERECTOMY     fibroids and cyst right ovary left oopherectomy   basal cell ca removed     bladder tack     BREAST BIOPSY Left    benign   [place]   [place]    Family History  Problem Relation Age of Onset   Pulmonary embolism Mother    Depression Mother        major depressive disorder   Hip fracture Mother        about 742   Hypertension Mother    Cancer Father        bladder   Diabetes Brother    Hyperlipidemia Brother    Arthritis Brother        DJD   Arthritis  Brother    Stroke Maternal Aunt    Breast cancer Maternal Aunt    Heart attack Neg Hx    Social History   Socioeconomic History   Marital status: Married    Spouse name: Not on file   Number of children: Not on file   Years of education: Not on file   Highest education level: Not on file  Occupational History   Not on file  Tobacco Use   Smoking status: Never   Smokeless tobacco: Never  Vaping Use   Vaping Use: Never used  Substance and Sexual Activity   Alcohol use: Not Currently   Drug use: No   Sexual activity: Yes    Partners: Female  Other Topics Concern   Not on file  Social History Narrative   Retired Arts development officer) some college   Married 4 children [place] and [place] Husband cancer MM. NHL in remission   Regular  exercise- yes   HH of 2   No pets    Multiple myeloma and non hogkins lymphoma in husband x 6 years   Regular exercise: walk, swim, some biking   Caffeine use: coffee daily, sweet tea   Social Determinants of Health   Financial Resource Strain: Low Risk    Difficulty of Paying Living Expenses: Not hard at all  Food Insecurity: No Food Insecurity   Worried About Charity fundraiser in the Last Year: Never true   Ran Out of Food in the Last Year: Never true  Transportation Needs: No Transportation Needs   Lack of Transportation (Medical): No   Lack of Transportation (Non-Medical): No  Physical Activity: Insufficiently Active   Days of Exercise per Week: 3 days   Minutes of Exercise per Session: 30 min  Stress: No Stress Concern Present   Feeling of Stress : Not at all  Social Connections: Not on file    Tobacco Counseling Counseling given: Not Answered   Clinical Intake:  Pre-visit preparation completed: Yes  Pain : No/denies pain     Nutritional Status: BMI 25 -29 Overweight Nutritional Risks: None Diabetes: No  How often do you need to have someone help you when you read instructions, pamphlets, or other written materials from your doctor or pharmacy?: 1 - Never What is the last grade level you completed in school?: some college  Diabetic? no  Interpreter Needed?: No  Information entered by :: NAllen LPN   Activities of Daily Living In your present state of health, do you have any difficulty performing the following activities: 05/03/2021 05/02/2021  Hearing? N N  Vision? N N  Difficulty concentrating or making decisions? N N  Walking or climbing stairs? N N  Dressing or bathing? N N  Doing errands, shopping? N N  Preparing Food and eating ? N N  Using the Toilet? N N  In the past six months, have you accidently leaked urine? N N  Do you have problems with loss of bowel control? N N  Managing your Medications? N N  Managing your Finances? N N  Housekeeping  or managing your Housekeeping? N N  Some recent data might be hidden    Patient Care Team: Panosh, Standley Brooking, MD as PCP - General Jerline Pain, MD as PCP - Cardiology (Cardiology) Rolm Bookbinder, MD (Dermatology) Frederik Schmidt, MD as Attending Physician (Oral Surgery) Jerline Pain, MD as Consulting Physician (Cardiology)  Indicate any recent Medical Services you may have received from other than [facility]  providers in the past year (date may be approximate).     Assessment:   This is a routine wellness examination for Benoit.  Hearing/Vision screen Vision Screening - Comments:: Regular eye exams, [facility]  Dietary issues and exercise activities discussed: Current Exercise Habits: Home exercise routine, Type of exercise: walking, Time (Minutes): 30, Frequency (Times/Week): 3, Weekly Exercise (Minutes/Week): 90   Goals Addressed             This Visit's Progress    Patient Stated       05/03/2021, to increase exercise and get up out of floor       Depression Screen PHQ 2/9 Scores 05/03/2021 02/16/2021 10/18/2019 05/09/2018 05/03/2017 02/24/2016 01/14/2015  PHQ - 2 Score 0 0 0 0 0 0 0  PHQ- 9 Score - 0 0 - - - -    Fall Risk Fall Risk  05/03/2021 05/02/2021 02/16/2021 10/18/2019 05/09/2018  Falls in the past year? 0 0 0 0 0  Number falls in past yr: - 0 - - 0  Injury with Fall? - 0 - - 0  Comment - - - - -  Risk for fall due to : Medication side effect - - - -  Follow up Falls evaluation completed;Education provided;Falls prevention discussed - - - Falls evaluation completed    FALL RISK PREVENTION PERTAINING TO THE HOME:  Any stairs in or around the home? No  If so, are there any without handrails?  N/a Home free of loose throw rugs in walkways, pet beds, electrical cords, etc? Yes  Adequate lighting in your home to reduce risk of falls? Yes   ASSISTIVE DEVICES UTILIZED TO PREVENT FALLS:  Life alert? No  Use of a cane, walker or w/c? No  Grab bars in the bathroom? Yes   Shower chair or bench in shower? Yes  Elevated toilet seat or a handicapped toilet? Yes   TIMED UP AND GO:  Was the test performed? No .      Cognitive Function:     6CIT Screen 05/03/2021  What Year? 0 points  What month? 0 points  What time? 0 points  Count back from 20 0 points  Months in reverse 0 points  Repeat phrase 0 points  Total Score 0    Immunizations Immunization History  Administered Date(s) Administered   Fluad Quad(high Dose 65+) 12/14/2020   Influenza Split 12/14/2010, 12/12/2011   Influenza Whole 12/08/2009   Influenza, High Dose Seasonal PF 12/12/2014, 12/25/2015, 12/19/2016, 12/27/2017   Influenza,inj,Quad PF,6+ Mos 12/04/2012, 12/19/2013   PFIZER SARS-COV-2 Pediatric Vaccination 5-58yr 01/24/2021   PFIZER(Purple Top)SARS-COV-2 Vaccination 03/25/2019, 04/15/2019, 01/30/2020, 01/28/2021   Pneumococcal Conjugate-13 03/19/2014   Pneumococcal Polysaccharide-23 12/14/2010   Td 02/29/2008   Zoster, Live 12/24/2012    TDAP status: Due, Education has been provided regarding the importance of this vaccine. Advised may receive this vaccine at local pharmacy or Health Dept. Aware to provide a copy of the vaccination record if obtained from local pharmacy or Health Dept. Verbalized acceptance and understanding.  Flu Vaccine status: Up to date  Pneumococcal vaccine status: Up to date  Covid-19 vaccine status: Completed vaccines  Qualifies for Shingles Vaccine? Yes   Zostavax completed Yes   Shingrix Completed?: No.    Education has been provided regarding the importance of this vaccine. Patient has been advised to call insurance company to determine out of pocket expense if they have not yet received this vaccine. Advised may also receive vaccine at local pharmacy or Health  Dept. Verbalized acceptance and understanding.  Screening Tests Health Maintenance  Topic Date Due   TETANUS/TDAP  02/28/2018   COVID-19 Vaccine (4 - Booster for Pfizer series)  03/25/2021   COLONOSCOPY (Pts 45-66yr Insurance coverage will need to be confirmed)  08/17/2021 (Originally 12/02/2019)   Zoster Vaccines- Shingrix (1 of 2) 08/17/2021 (Originally 03/03/1964)   Pneumonia Vaccine 76 Years old  Completed   INFLUENZA VACCINE  Completed   DEXA SCAN  Completed   Hepatitis C Screening  Completed   HPV VACCINES  Aged Out    Health Maintenance  Health Maintenance Due  Topic Date Due   TETANUS/TDAP  02/28/2018   COVID-19 Vaccine (4 - Booster for Pfizer series) 03/25/2021    Colorectal cancer screening: Type of screening: Colonoscopy. Completed 12/02/2014. Repeat every 5 years  Mammogram status: Completed 12/03/2020. Repeat every year  Bone Density status: patient to schedule  Lung Cancer Screening: (Low Dose CT Chest recommended if Age 76-80years, 30 pack-year currently smoking OR have quit w/in 15years.) does not qualify.   Lung Cancer Screening Referral: no  Additional Screening:  Hepatitis C Screening: does qualify; Completed 02/24/2016  Vision Screening: Recommended annual ophthalmology exams for early detection of glaucoma and other disorders of the eye. Is the patient up to date with their annual eye exam?  Yes  Who is the provider or what is the name of the office in which the patient attends annual eye exams[facility] is not established with a provider, would they like to be referred to a provider to establish care? No .   Dental Screening: Recommended annual dental exams for proper oral hygiene  Community Resource Referral / Chronic Care Management: CRR required this visit?  No   CCM required this visit?  No      Plan:     I have personally reviewed and noted the following in the patients chart:   Medical and social history Use of alcohol, tobacco or illicit drugs  Current medications and supplements including opioid prescriptions.  Functional ability and status Nutritional status Physical activity Advanced  directives List of other physicians Hospitalizations, surgeries, and ER visits in previous 12 months Vitals Screenings to include cognitive, depression, and falls Referrals and appointments  In addition, I have reviewed and discussed with patient certain preventive protocols, quality metrics, and best practice recommendations. A written personalized care plan for preventive services as well as general preventive health recommendations were provided to patient.     NKellie Simmering LPN   [id number]/05/[id number]  Nurse Notes: none  Due to this being a virtual visit, the after visit summary with patients personalized plan was offered to patient via mail or my-chart. Patient would like to access on my-chart

## 2021-05-03 NOTE — Patient Instructions (Signed)
Nicole Wood , Thank you for taking time to come for your Medicare Wellness Visit. I appreciate your ongoing commitment to your health goals. Please review the following plan we discussed and let me know if I can assist you in the future.   Screening recommendations/referrals: Colonoscopy: completed 12/02/2014, due now Mammogram: completed 12/03/2020, due 12/04/2021 Bone Density: patient to schedule Recommended yearly ophthalmology/optometry visit for glaucoma screening and checkup Recommended yearly dental visit for hygiene and checkup  Vaccinations: Influenza vaccine: completed 12/14/2020, due next flu season Pneumococcal vaccine: completed 03/19/2014 Tdap vaccine: due now Shingles vaccine: discussed   Covid-19: 03/25/2019, 04/15/2019, 01/2020, 01/24/2021  Advanced directives: Advance directive discussed with you today.   Conditions/risks identified: none  Next appointment: Follow up in one year for your annual wellness visit    Preventive Care 65 Years and Older, Female Preventive care refers to lifestyle choices and visits with your health care provider that can promote health and wellness. What does preventive care include? A yearly physical exam. This is also called an annual well check. Dental exams once or twice a year. Routine eye exams. Ask your health care provider how often you should have your eyes checked. Personal lifestyle choices, including: Daily care of your teeth and gums. Regular physical activity. Eating a healthy diet. Avoiding tobacco and drug use. Limiting alcohol use. Practicing safe sex. Taking low-dose aspirin every day. Taking vitamin and mineral supplements as recommended by your health care provider. What happens during an annual well check? The services and screenings done by your health care provider during your annual well check will depend on your age, overall health, lifestyle risk factors, and family history of disease. Counseling  Your health  care provider may ask you questions about your: Alcohol use. Tobacco use. Drug use. Emotional well-being. Home and relationship well-being. Sexual activity. Eating habits. History of falls. Memory and ability to understand (cognition). Work and work Statistician. Reproductive health. Screening  You may have the following tests or measurements: Height, weight, and BMI. Blood pressure. Lipid and cholesterol levels. These may be checked every 5 years, or more frequently if you are over 33 years old. Skin check. Lung cancer screening. You may have this screening every year starting at age 52 if you have a 30-pack-year history of smoking and currently smoke or have quit within the past 15 years. Fecal occult blood test (FOBT) of the stool. You may have this test every year starting at age 76. Flexible sigmoidoscopy or colonoscopy. You may have a sigmoidoscopy every 5 years or a colonoscopy every 10 years starting at age 58. Hepatitis C blood test. Hepatitis B blood test. Sexually transmitted disease (STD) testing. Diabetes screening. This is done by checking your blood sugar (glucose) after you have not eaten for a while (fasting). You may have this done every 1-3 years. Bone density scan. This is done to screen for osteoporosis. You may have this done starting at age 4. Mammogram. This may be done every 1-2 years. Talk to your health care provider about how often you should have regular mammograms. Talk with your health care provider about your test results, treatment options, and if necessary, the need for more tests. Vaccines  Your health care provider may recommend certain vaccines, such as: Influenza vaccine. This is recommended every year. Tetanus, diphtheria, and acellular pertussis (Tdap, Td) vaccine. You may need a Td booster every 10 years. Zoster vaccine. You may need this after age 92. Pneumococcal 13-valent conjugate (PCV13) vaccine. One dose is recommended after  age  15. Pneumococcal polysaccharide (PPSV23) vaccine. One dose is recommended after age 7. Talk to your health care provider about which screenings and vaccines you need and how often you need them. This information is not intended to replace advice given to you by your health care provider. Make sure you discuss any questions you have with your health care provider. Document Released: 03/13/2015 Document Revised: 11/04/2015 Document Reviewed: 12/16/2014 Elsevier Interactive Patient Education  2017 [place] Prevention in the Home Falls can cause injuries. They can happen to people of all ages. There are many things you can do to make your home safe and to help prevent falls. What can I do on the outside of my home? Regularly fix the edges of walkways and driveways and fix any cracks. Remove anything that might make you trip as you walk through a door, such as a raised step or threshold. Trim any bushes or trees on the path to your home. Use bright outdoor lighting. Clear any walking paths of anything that might make someone trip, such as rocks or tools. Regularly check to see if handrails are loose or broken. Make sure that both sides of any steps have handrails. Any raised decks and porches should have guardrails on the edges. Have any leaves, snow, or ice cleared regularly. Use sand or salt on walking paths during winter. Clean up any spills in your garage right away. This includes oil or grease spills. What can I do in the bathroom? Use night lights. Install grab bars by the toilet and in the tub and shower. Do not use towel bars as grab bars. Use non-skid mats or decals in the tub or shower. If you need to sit down in the shower, use a plastic, non-slip stool. Keep the floor dry. Clean up any water that spills on the floor as soon as it happens. Remove soap buildup in the tub or shower regularly. Attach bath mats securely with double-sided non-slip rug tape. Do not have throw  rugs and other things on the floor that can make you trip. What can I do in the bedroom? Use night lights. Make sure that you have a light by your bed that is easy to reach. Do not use any sheets or blankets that are too big for your bed. They should not hang down onto the floor. Have a firm chair that has side arms. You can use this for support while you get dressed. Do not have throw rugs and other things on the floor that can make you trip. What can I do in the kitchen? Clean up any spills right away. Avoid walking on wet floors. Keep items that you use a lot in easy-to-reach places. If you need to reach something above you, use a strong step stool that has a grab bar. Keep electrical cords out of the way. Do not use floor polish or wax that makes floors slippery. If you must use wax, use non-skid floor wax. Do not have throw rugs and other things on the floor that can make you trip. What can I do with my stairs? Do not leave any items on the stairs. Make sure that there are handrails on both sides of the stairs and use them. Fix handrails that are broken or loose. Make sure that handrails are as long as the stairways. Check any carpeting to make sure that it is firmly attached to the stairs. Fix any carpet that is loose or worn. Avoid having throw rugs  at the top or bottom of the stairs. If you do have throw rugs, attach them to the floor with carpet tape. Make sure that you have a light switch at the top of the stairs and the bottom of the stairs. If you do not have them, ask someone to add them for you. What else can I do to help prevent falls? Wear shoes that: Do not have high heels. Have rubber bottoms. Are comfortable and fit you well. Are closed at the toe. Do not wear sandals. If you use a stepladder: Make sure that it is fully opened. Do not climb a closed stepladder. Make sure that both sides of the stepladder are locked into place. Ask someone to hold it for you, if  possible. Clearly mark and make sure that you can see: Any grab bars or handrails. First and last steps. Where the edge of each step is. Use tools that help you move around (mobility aids) if they are needed. These include: Canes. Walkers. Scooters. Crutches. Turn on the lights when you go into a dark area. Replace any light bulbs as soon as they burn out. Set up your furniture so you have a clear path. Avoid moving your furniture around. If any of your floors are uneven, fix them. If there are any pets around you, be aware of where they are. Review your medicines with your doctor. Some medicines can make you feel dizzy. This can increase your chance of falling. Ask your doctor what other things that you can do to help prevent falls. This information is not intended to replace advice given to you by your health care provider. Make sure you discuss any questions you have with your health care provider. Document Released: 12/11/2008 Document Revised: 07/23/2015 Document Reviewed: 03/21/2014 Elsevier Interactive Patient Education  2017 [employer].

## 2021-05-18 ENCOUNTER — Other Ambulatory Visit: Payer: Self-pay | Admitting: Internal Medicine

## 2021-06-13 ENCOUNTER — Other Ambulatory Visit: Payer: Self-pay | Admitting: Internal Medicine

## 2021-06-14 DIAGNOSIS — H5203 Hypermetropia, bilateral: Secondary | ICD-10-CM | POA: Diagnosis not present

## 2021-06-14 DIAGNOSIS — H353131 Nonexudative age-related macular degeneration, bilateral, early dry stage: Secondary | ICD-10-CM | POA: Diagnosis not present

## 2021-06-14 DIAGNOSIS — H524 Presbyopia: Secondary | ICD-10-CM | POA: Diagnosis not present

## 2021-06-14 DIAGNOSIS — H2513 Age-related nuclear cataract, bilateral: Secondary | ICD-10-CM | POA: Diagnosis not present

## 2021-06-16 ENCOUNTER — Other Ambulatory Visit: Payer: Self-pay | Admitting: Cardiology

## 2021-06-23 DIAGNOSIS — L821 Other seborrheic keratosis: Secondary | ICD-10-CM | POA: Diagnosis not present

## 2021-06-23 DIAGNOSIS — L819 Disorder of pigmentation, unspecified: Secondary | ICD-10-CM | POA: Diagnosis not present

## 2021-06-23 DIAGNOSIS — L814 Other melanin hyperpigmentation: Secondary | ICD-10-CM | POA: Diagnosis not present

## 2021-06-23 DIAGNOSIS — D1801 Hemangioma of skin and subcutaneous tissue: Secondary | ICD-10-CM | POA: Diagnosis not present

## 2021-06-23 DIAGNOSIS — D692 Other nonthrombocytopenic purpura: Secondary | ICD-10-CM | POA: Diagnosis not present

## 2021-06-23 DIAGNOSIS — Z85828 Personal history of other malignant neoplasm of skin: Secondary | ICD-10-CM | POA: Diagnosis not present

## 2021-06-23 DIAGNOSIS — L820 Inflamed seborrheic keratosis: Secondary | ICD-10-CM | POA: Diagnosis not present

## 2021-06-23 DIAGNOSIS — L570 Actinic keratosis: Secondary | ICD-10-CM | POA: Diagnosis not present

## 2021-06-23 DIAGNOSIS — R208 Other disturbances of skin sensation: Secondary | ICD-10-CM | POA: Diagnosis not present

## 2021-09-14 ENCOUNTER — Other Ambulatory Visit: Payer: Self-pay | Admitting: Cardiology

## 2021-10-25 ENCOUNTER — Other Ambulatory Visit: Payer: Self-pay | Admitting: Cardiology

## 2021-11-24 DIAGNOSIS — Z23 Encounter for immunization: Secondary | ICD-10-CM | POA: Diagnosis not present

## 2021-12-08 DIAGNOSIS — S8001XA Contusion of right knee, initial encounter: Secondary | ICD-10-CM | POA: Diagnosis not present

## 2021-12-08 DIAGNOSIS — S8002XA Contusion of left knee, initial encounter: Secondary | ICD-10-CM | POA: Diagnosis not present

## 2021-12-21 ENCOUNTER — Encounter: Payer: Self-pay | Admitting: Cardiology

## 2021-12-21 ENCOUNTER — Ambulatory Visit: Payer: Medicare Other | Attending: Cardiology | Admitting: Cardiology

## 2021-12-21 VITALS — BP 120/70 | HR 65 | Ht 64.0 in | Wt 141.0 lb

## 2021-12-21 DIAGNOSIS — R002 Palpitations: Secondary | ICD-10-CM | POA: Diagnosis not present

## 2021-12-21 DIAGNOSIS — I10 Essential (primary) hypertension: Secondary | ICD-10-CM | POA: Diagnosis not present

## 2021-12-21 NOTE — Patient Instructions (Signed)
Medication Instructions:  The current medical regimen is effective;  continue present plan and medications.  *If you need a refill on your cardiac medications before your next appointment, please call your pharmacy*  Follow-Up: At [facility], you and your health needs are our priority.  As part of our continuing mission to provide you with exceptional heart care, we have created designated Provider Care Teams.  These Care Teams include your primary Cardiologist (physician) and Advanced Practice Providers (APPs -  Physician Assistants and Nurse Practitioners) who all work together to provide you with the care you need, when you need it.  We recommend signing up for the patient portal called "MyChart".  Sign up information is provided on this After Visit Summary.  MyChart is used to connect with patients for Virtual Visits (Telemedicine).  Patients are able to view lab/test results, encounter notes, upcoming appointments, etc.  Non-urgent messages can be sent to your provider as well.   To learn more about what you can do with MyChart, go to [email or web address].    Your next appointment:   1 year(s)  The format for your next appointment:   In Person  Provider:   Mark Skains, MD      Important Information About Sugar       
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
## 2021-12-23 NOTE — Addendum Note (Signed)
Addended by: Maren Beach, Shabana Armentrout A on: 12/23/2021 09:00 AM   Modules accepted: Orders

## 2021-12-26 ENCOUNTER — Other Ambulatory Visit: Payer: Self-pay | Admitting: Internal Medicine

## 2021-12-30 NOTE — Addendum Note (Signed)
Addended by: Maren Beach, Azarias Chiou A on: 12/30/2021 08:33 AM   Modules accepted: Orders

## 2021-12-31 NOTE — Telephone Encounter (Signed)
Last OV 02/16/21 Next OV: none scheduled

## 2022-01-16 ENCOUNTER — Other Ambulatory Visit: Payer: Self-pay | Admitting: Cardiology

## 2022-03-11 ENCOUNTER — Other Ambulatory Visit: Payer: Self-pay | Admitting: Internal Medicine

## 2022-03-30 ENCOUNTER — Other Ambulatory Visit: Payer: Self-pay | Admitting: Internal Medicine

## 2022-03-30 DIAGNOSIS — F411 Generalized anxiety disorder: Secondary | ICD-10-CM

## 2022-04-06 ENCOUNTER — Other Ambulatory Visit: Payer: Self-pay | Admitting: Internal Medicine

## 2022-04-06 DIAGNOSIS — Z1231 Encounter for screening mammogram for malignant neoplasm of breast: Secondary | ICD-10-CM

## 2022-04-09 ENCOUNTER — Other Ambulatory Visit: Payer: Self-pay | Admitting: Internal Medicine

## 2022-04-10 ENCOUNTER — Other Ambulatory Visit: Payer: Self-pay | Admitting: Family

## 2022-04-12 ENCOUNTER — Ambulatory Visit
Admission: RE | Admit: 2022-04-12 | Discharge: 2022-04-12 | Disposition: A | Discharge status: Home / Self Care | Payer: Medicare Other | Source: Ambulatory Visit | Attending: Internal Medicine | Admitting: Internal Medicine

## 2022-04-12 DIAGNOSIS — Z1231 Encounter for screening mammogram for malignant neoplasm of breast: Secondary | ICD-10-CM

## 2022-05-09 ENCOUNTER — Ambulatory Visit: Payer: Medicare Other

## 2022-05-16 ENCOUNTER — Telehealth (INDEPENDENT_AMBULATORY_CARE_PROVIDER_SITE_OTHER): Payer: Medicare Other

## 2022-05-16 VITALS — Ht 64.0 in | Wt 141.0 lb

## 2022-05-16 DIAGNOSIS — Z1211 Encounter for screening for malignant neoplasm of colon: Secondary | ICD-10-CM

## 2022-05-16 DIAGNOSIS — Z0000 Encounter for general adult medical examination without abnormal findings: Secondary | ICD-10-CM

## 2022-05-16 NOTE — Patient Instructions (Addendum)
Nicole Wood , Thank you for taking time to come for your Medicare Wellness Visit. I appreciate your ongoing commitment to your health goals. Please review the following plan we discussed and let me know if I can assist you in the future.   These are the goals we discussed:  Goals       Increase physical activity (pt-stated)      I want to get out an walk everyday.      Patient Stated      05/03/2021, to increase exercise and get up out of floor        This is a list of the screening recommended for you and due dates:  Health Maintenance  Topic Date Due   Zoster (Shingles) Vaccine (1 of 2) Never done   DTaP/Tdap/Td vaccine (2 - Tdap) 02/28/2018   Flu Shot  05/29/2022*   COVID-19 Vaccine (4 - 2023-24 season) 06/01/2022*   Colon Cancer Screening  05/16/2023*   Medicare Annual Wellness Visit  05/16/2023   Pneumonia Vaccine  Completed   DEXA scan (bone density measurement)  Completed   Hepatitis C Screening: USPSTF Recommendation to screen - Ages 54-79 yo.  Completed   HPV Vaccine  Aged Out  *Topic was postponed. The date shown is not the original due date.    Advanced directives: Advance directive discussed with you today. Even though you declined this today, please call our office should you change your mind, and we can give you the proper paperwork for you to fill out.   Conditions/risks identified: None  Next appointment: Follow up in one year for your annual wellness visit     Preventive Care 65 Years and Older, Female Preventive care refers to lifestyle choices and visits with your health care provider that can promote health and wellness. What does preventive care include? A yearly physical exam. This is also called an annual well check. Dental exams once or twice a year. Routine eye exams. Ask your health care provider how often you should have your eyes checked. Personal lifestyle choices, including: Daily care of your teeth and gums. Regular physical activity. Eating  a healthy diet. Avoiding tobacco and drug use. Limiting alcohol use. Practicing safe sex. Taking low-dose aspirin every day. Taking vitamin and mineral supplements as recommended by your health care provider. What happens during an annual well check? The services and screenings done by your health care provider during your annual well check will depend on your age, overall health, lifestyle risk factors, and family history of disease. Counseling  Your health care provider may ask you questions about your: Alcohol use. Tobacco use. Drug use. Emotional well-being. Home and relationship well-being. Sexual activity. Eating habits. History of falls. Memory and ability to understand (cognition). Work and work Statistician. Reproductive health. Screening  You may have the following tests or measurements: Height, weight, and BMI. Blood pressure. Lipid and cholesterol levels. These may be checked every 5 years, or more frequently if you are over 37 years old. Skin check. Lung cancer screening. You may have this screening every year starting at age 53 if you have a 30-pack-year history of smoking and currently smoke or have quit within the past 15 years. Fecal occult blood test (FOBT) of the stool. You may have this test every year starting at age 62. Flexible sigmoidoscopy or colonoscopy. You may have a sigmoidoscopy every 5 years or a colonoscopy every 10 years starting at age 49. Hepatitis C blood test. Hepatitis B blood test. Sexually transmitted  disease (STD) testing. Diabetes screening. This is done by checking your blood sugar (glucose) after you have not eaten for a while (fasting). You may have this done every 1-3 years. Bone density scan. This is done to screen for osteoporosis. You may have this done starting at age 77. Mammogram. This may be done every 1-2 years. Talk to your health care provider about how often you should have regular mammograms. Talk with your health care  provider about your test results, treatment options, and if necessary, the need for more tests. Vaccines  Your health care provider may recommend certain vaccines, such as: Influenza vaccine. This is recommended every year. Tetanus, diphtheria, and acellular pertussis (Tdap, Td) vaccine. You may need a Td booster every 10 years. Zoster vaccine. You may need this after age 22. Pneumococcal 13-valent conjugate (PCV13) vaccine. One dose is recommended after age 15. Pneumococcal polysaccharide (PPSV23) vaccine. One dose is recommended after age 76. Talk to your health care provider about which screenings and vaccines you need and how often you need them. This information is not intended to replace advice given to you by your health care provider. Make sure you discuss any questions you have with your health care provider. Document Released: 03/13/2015 Document Revised: 11/04/2015 Document Reviewed: 12/16/2014 Elsevier Interactive Patient Education  2017 [employer] Prevention in the Home Falls can cause injuries. They can happen to people of all ages. There are many things you can do to make your home safe and to help prevent falls. What can I do on the outside of my home? Regularly fix the edges of walkways and driveways and fix any cracks. Remove anything that might make you trip as you walk through a door, such as a raised step or threshold. Trim any bushes or trees on the path to your home. Use bright outdoor lighting. Clear any walking paths of anything that might make someone trip, such as rocks or tools. Regularly check to see if handrails are loose or broken. Make sure that both sides of any steps have handrails. Any raised decks and porches should have guardrails on the edges. Have any leaves, snow, or ice cleared regularly. Use sand or salt on walking paths during winter. Clean up any spills in your garage right away. This includes oil or grease spills. What can I do in the  bathroom? Use night lights. Install grab bars by the toilet and in the tub and shower. Do not use towel bars as grab bars. Use non-skid mats or decals in the tub or shower. If you need to sit down in the shower, use a plastic, non-slip stool. Keep the floor dry. Clean up any water that spills on the floor as soon as it happens. Remove soap buildup in the tub or shower regularly. Attach bath mats securely with double-sided non-slip rug tape. Do not have throw rugs and other things on the floor that can make you trip. What can I do in the bedroom? Use night lights. Make sure that you have a light by your bed that is easy to reach. Do not use any sheets or blankets that are too big for your bed. They should not hang down onto the floor. Have a firm chair that has side arms. You can use this for support while you get dressed. Do not have throw rugs and other things on the floor that can make you trip. What can I do in the kitchen? Clean up any spills right away. Avoid walking  on wet floors. Keep items that you use a lot in easy-to-reach places. If you need to reach something above you, use a strong step stool that has a grab bar. Keep electrical cords out of the way. Do not use floor polish or wax that makes floors slippery. If you must use wax, use non-skid floor wax. Do not have throw rugs and other things on the floor that can make you trip. What can I do with my stairs? Do not leave any items on the stairs. Make sure that there are handrails on both sides of the stairs and use them. Fix handrails that are broken or loose. Make sure that handrails are as long as the stairways. Check any carpeting to make sure that it is firmly attached to the stairs. Fix any carpet that is loose or worn. Avoid having throw rugs at the top or bottom of the stairs. If you do have throw rugs, attach them to the floor with carpet tape. Make sure that you have a light switch at the top of the stairs and the  bottom of the stairs. If you do not have them, ask someone to add them for you. What else can I do to help prevent falls? Wear shoes that: Do not have high heels. Have rubber bottoms. Are comfortable and fit you well. Are closed at the toe. Do not wear sandals. If you use a stepladder: Make sure that it is fully opened. Do not climb a closed stepladder. Make sure that both sides of the stepladder are locked into place. Ask someone to hold it for you, if possible. Clearly mark and make sure that you can see: Any grab bars or handrails. First and last steps. Where the edge of each step is. Use tools that help you move around (mobility aids) if they are needed. These include: Canes. Walkers. Scooters. Crutches. Turn on the lights when you go into a dark area. Replace any light bulbs as soon as they burn out. Set up your furniture so you have a clear path. Avoid moving your furniture around. If any of your floors are uneven, fix them. If there are any pets around you, be aware of where they are. Review your medicines with your doctor. Some medicines can make you feel dizzy. This can increase your chance of falling. Ask your doctor what other things that you can do to help prevent falls. This information is not intended to replace advice given to you by your health care provider. Make sure you discuss any questions you have with your health care provider. Document Released: 12/11/2008 Document Revised: 07/23/2015 Document Reviewed: 03/21/2014 Elsevier Interactive Patient Education  2017 [employer].

## 2022-05-16 NOTE — Progress Notes (Signed)
Subjective:   Nicole Wood is a 77 y.o. female who presents for Medicare Annual (Subsequent) preventive examination.  Review of Systems    Virtual Visit via Telephone Note  I connected with  [name] on 05/16/22 at  9:15 AM EDT by telephone and verified that I am speaking with the correct person using two identifiers.  Location: Patient: Home Provider: Office Persons participating in the virtual visit: patient/Nurse Health Advisor   I discussed the limitations, risks, security and privacy concerns of performing an evaluation and management service by telephone and the availability of in person appointments. The patient expressed understanding and agreed to proceed.  Interactive audio and video telecommunications were attempted between this nurse and patient, however failed, due to patient having technical difficulties OR patient did not have access to video capability.  We continued and completed visit with audio only.  Some vital signs may be absent or patient reported.   Nicole Peaches, LPN  Cardiac Risk Factors include: advanced age (>31men, >7 women);hypertension     Objective:    Today's Vitals   05/16/22 0916  Weight: 141 lb (64 kg)  Height: 5\' 4"  (1.626 m)   Body mass index is 24.2 kg/m.     05/16/2022    9:25 AM 05/03/2021   10:45 AM 02/04/2018    9:06 AM 12/02/2014   10:47 AM  Advanced Directives  Does Patient Have a Medical Advance Directive? No No No No  Would patient like information on creating a medical advance directive? No - Patient declined  No - Patient declined No - patient declined information    Current Medications (verified) Outpatient Encounter Medications as of 05/16/2022  Medication Sig   bifidobacterium infantis (ALIGN) capsule Take 1 capsule by mouth daily.   Cholecalciferol (VITAMIN D3) 5000 units CAPS Take 1 capsule by mouth daily.   citalopram (CELEXA) 10 MG tablet TAKE 1 TABLET(10 MG) BY MOUTH TWICE DAILY.  *Please contact  office to schedule an appointment for future refills*   LORazepam (ATIVAN) 0.5 MG tablet TAKE 1 TABLET(0.5 MG) BY MOUTH EVERY 8 HOURS AS NEEDED   losartan (COZAAR) 50 MG tablet TAKE 1 TABLET(50 MG) BY MOUTH DAILY   metoprolol succinate (TOPROL-XL) 50 MG 24 hr tablet Take 1 tablet (50 mg total) by mouth at bedtime.   Multiple Vitamins-Minerals (ICAPS PO) Take 1 capsule by mouth 2 (two) times daily.    naproxen sodium (ANAPROX) 220 MG tablet Take 220 mg by mouth 2 (two) times daily as needed (pain). ALEVE   polyethylene glycol (MIRALAX / GLYCOLAX) packet Take 8.5 g by mouth daily.   No facility-administered encounter medications on file as of 05/16/2022.    Allergies (verified) Contrast media [iodinated contrast media], Lisinopril, and Penicillins   History: Past Medical History:  Diagnosis Date   Basal cell cancer    nose   Cystocele    Depression    in the past   Hematuria    idiopathic   Hypertension    Osteoarthritis    Osteopenia    dexa 2009, -2.3 osteoporosis -2.5 hip 2011   Urinary incontinence    Past Surgical History:  Procedure Laterality Date   ABDOMINAL HYSTERECTOMY     fibroids and cyst right ovary left oopherectomy   basal cell ca removed     bladder tack     BREAST BIOPSY Left    benign   rt shoulder surgery   2004   [facility]  Family History  Problem Relation Age of Onset   Pulmonary embolism Mother    Depression Mother        major depressive disorder   Hip fracture Mother        about 88    Hypertension Mother    Cancer Father        bladder   Diabetes Brother    Hyperlipidemia Brother    Arthritis Brother        DJD   Arthritis Brother    Stroke Maternal Aunt    Breast cancer Maternal Aunt    Heart attack Neg Hx    Social History   Socioeconomic History   Marital status: Married    Spouse name: Not on file   Number of children: Not on file   Years of education: Not on file   Highest education level: Not on file   Occupational History   Not on file  Tobacco Use   Smoking status: Never   Smokeless tobacco: Never  Vaping Use   Vaping Use: Never used  Substance and Sexual Activity   Alcohol use: Not Currently   Drug use: No   Sexual activity: Yes    Partners: Female  Other Topics Concern   Not on file  Social History Narrative   Retired Arts development officer) some college   Married 4 children [place], and [place], and [place] Husband cancer MM. NHL in remission   Regular exercise- yes   HH of 2   No pets    Multiple myeloma and non hogkins lymphoma in husband x 6 years   Regular exercise: walk, swim, some biking   Caffeine use: coffee daily, sweet tea   Social Determinants of Health   Financial Resource Strain: Low Risk  (05/16/2022)   Overall Financial Resource Strain (CARDIA)    Difficulty of Paying Living Expenses: Not hard at all  Food Insecurity: No Food Insecurity (05/16/2022)   Hunger Vital Sign    Worried About Running Out of Food in the Last Year: Never true    Ran Out of Food in the Last Year: Never true  Transportation Needs: No Transportation Needs (05/16/2022)   PRAPARE - Hydrologist (Medical): No    Lack of Transportation (Non-Medical): No  Physical Activity: Insufficiently Active (05/16/2022)   Exercise Vital Sign    Days of Exercise per Week: 3 days    Minutes of Exercise per Session: 30 min  Stress: No Stress Concern Present (05/16/2022)   [place]    Feeling of Stress : Not at all  Social Connections: [address] (05/16/2022)   Social Connection and Isolation Panel [NHANES]    Frequency of Communication with Friends and Family: More than three times a week    Frequency of Social Gatherings with Friends and Family: More than three times a week    Attends Religious Services: More than 4 times per year    Active Member of [employer] or Organizations: Yes    Attends Arts development officer: More than 4 times per year    Marital Status: Married    Tobacco Counseling Counseling given: Not Answered   Clinical Intake:  Pre-visit preparation completed: No  Pain : No/denies pain     BMI - recorded: 24.2 Nutritional Status: BMI of 19-24  Normal Nutritional Risks: None Diabetes: No  How often do you need to have someone help you when you read instructions, pamphlets,  or other written materials from your doctor or pharmacy?: 1 - Never  Diabetic?  No  Interpreter Needed?: No  Information entered by :: Stacy Gardner LPN   Activities of Daily Living    05/16/2022    9:22 AM  In your present state of health, do you have any difficulty performing the following activities:  Hearing? 0  Vision? 0  Difficulty concentrating or making decisions? 0  Walking or climbing stairs? 0  Dressing or bathing? 0  Doing errands, shopping? 0  Preparing Food and eating ? N  Using the Toilet? N  In the past six months, have you accidently leaked urine? N  Do you have problems with loss of bowel control? N  Managing your Medications? N  Managing your Finances? N  Housekeeping or managing your Housekeeping? N    Patient Care Team: Panosh, Standley Brooking, MD as PCP - General Jerline Pain, MD as PCP - Cardiology (Cardiology) Rolm Bookbinder, MD (Dermatology) Frederik Schmidt, MD as Attending Physician (Oral Surgery) Jerline Pain, MD as Consulting Physician (Cardiology)  Indicate any recent Medical Services you may have received from other than [facility] providers in the past year (date may be approximate).     Assessment:   This is a routine wellness examination for [place].  Hearing/Vision screen Hearing Screening - Comments:: Denies hearing difficulties   Vision Screening - Comments:: Wears rx glasses - up to date with routine eye exams with  Dr Satira Sark  Dietary issues and exercise activities discussed: Exercise limited by: None identified   Goals Addressed                This Visit's Progress     Increase physical activity (pt-stated)        I want to get out an walk everyday.       Depression Screen    05/16/2022    9:22 AM 05/03/2021   10:46 AM 02/16/2021   10:05 AM 10/18/2019    9:59 AM 05/09/2018   10:27 AM 05/03/2017    2:13 PM 02/24/2016   10:21 AM  PHQ 2/9 Scores  PHQ - 2 Score 0 0 0 0 0 0 0  PHQ- 9 Score   0 0       Fall Risk    05/16/2022    9:23 AM 05/03/2021   10:46 AM 05/02/2021    5:20 PM 02/16/2021   10:05 AM 10/18/2019   10:02 AM  Fall Risk   Falls in the past year? 1 0 0 0 0  Number falls in past yr: 0  0    Injury with Fall? 0  0    Risk for fall due to : No Fall Risks Medication side effect     Follow up Falls prevention discussed Falls evaluation completed;Education provided;Falls prevention discussed       FALL RISK PREVENTION PERTAINING TO THE HOME:  Any stairs in or around the home? No  If so, are there any without handrails? No  Home free of loose throw rugs in walkways, pet beds, electrical cords, etc? Yes  Adequate lighting in your home to reduce risk of falls? Yes   ASSISTIVE DEVICES UTILIZED TO PREVENT FALLS:  Life alert? No  Use of a cane, walker or w/c? No  Grab bars in the bathroom? Yes  Shower chair or bench in shower? Yes  Elevated toilet seat or a handicapped toilet? Yes   TIMED UP AND GO:  Was the test performed? No . Audio  Visit   Cognitive Function:        05/16/2022    9:25 AM 05/03/2021   10:48 AM  6CIT Screen  What Year? 0 points 0 points  What month? 0 points 0 points  What time? 0 points 0 points  Count back from 20 0 points 0 points  Months in reverse 0 points 0 points  Repeat phrase 0 points 0 points  Total Score 0 points 0 points    Immunizations Immunization History  Administered Date(s) Administered   Fluad Quad(high Dose 65+) 12/14/2020   Influenza Split 12/14/2010, 12/12/2011   Influenza Whole 12/08/2009   Influenza, High Dose Seasonal PF 12/12/2014,  12/25/2015, 12/19/2016, 12/27/2017   Influenza,inj,Quad PF,6+ Mos 12/04/2012, 12/19/2013   PFIZER SARS-COV-2 Pediatric Vaccination 5-71yrs 01/24/2021   PFIZER(Purple Top)SARS-COV-2 Vaccination 03/25/2019, 04/15/2019, 01/30/2020, 01/28/2021   Pneumococcal Conjugate-13 03/19/2014   Pneumococcal Polysaccharide-23 12/14/2010   Td 02/29/2008   Zoster, Live 12/24/2012    TDAP status: Due, Education has been provided regarding the importance of this vaccine. Advised may receive this vaccine at local pharmacy or Health Dept. Aware to provide a copy of the vaccination record if obtained from local pharmacy or Health Dept. Verbalized acceptance and understanding.  Flu Vaccine status: Up to date  Pneumococcal vaccine status: Up to date  Covid-19 vaccine status: Completed vaccines  Qualifies for Shingles Vaccine? Yes   Zostavax completed No   Shingrix Completed?: No.    Education has been provided regarding the importance of this vaccine. Patient has been advised to call insurance company to determine out of pocket expense if they have not yet received this vaccine. Advised may also receive vaccine at local pharmacy or Health Dept. Verbalized acceptance and understanding.  Screening Tests Health Maintenance  Topic Date Due   Zoster Vaccines- Shingrix (1 of 2) Never done   DTaP/Tdap/Td (2 - Tdap) 02/28/2018   INFLUENZA VACCINE  05/29/2022 (Originally 09/28/2021)   COVID-19 Vaccine (4 - 2023-24 season) 06/01/2022 (Originally 10/29/2021)   COLONOSCOPY (Pts 45-28yrs Insurance coverage will need to be confirmed)  05/16/2023 (Originally 12/02/2019)   Medicare Annual Wellness ([place])  05/16/2023   Pneumonia Vaccine 72+ Years old  Completed   DEXA SCAN  Completed   Hepatitis C Screening  Completed   HPV VACCINES  Aged Out    Health Maintenance  Health Maintenance Due  Topic Date Due   Zoster Vaccines- Shingrix (1 of 2) Never done   DTaP/Tdap/Td (2 - Tdap) 02/28/2018    Colorectal cancer screening:  Referral to GI placed 05/16/22. Pt aware the office will call re: appt.    Bone Density status: Completed 12/06/12. Results reflect: Bone density results: OSTEOPOROSIS. Repeat every   years.  Lung Cancer Screening: (Low Dose CT Chest recommended if Age 54-80 years, 30 pack-year currently smoking OR have quit w/in 15years.) does not qualify.     Additional Screening:  Hepatitis C Screening: does qualify; Completed 02/24/16  Vision Screening: Recommended annual ophthalmology exams for early detection of glaucoma and other disorders of the eye. Is the patient up to date with their annual eye exam?  Yes  Who is the provider or what is the name of the office in which the patient attends annual eye exams? Dr Prudencio Burly If pt is not established with a provider, would they like to be referred to a provider to establish care? No .   Dental Screening: Recommended annual dental exams for proper oral hygiene  Community Resource Referral / Chronic Care Management:  CRR required  this visit?  No   CCM required this visit?  No      Plan:     I have personally reviewed and noted the following in the patient's chart:   Medical and social history Use of alcohol, tobacco or illicit drugs  Current medications and supplements including opioid prescriptions. Patient is not currently taking opioid prescriptions. Functional ability and status Nutritional status Physical activity Advanced directives List of other physicians Hospitalizations, surgeries, and ER visits in previous 12 months Vitals Screenings to include cognitive, depression, and falls Referrals and appointments  In addition, I have reviewed and discussed with patient certain preventive protocols, quality metrics, and best practice recommendations. A written personalized care plan for preventive services as well as general preventive health recommendations were provided to patient.     Nicole Peaches, LPN   [id number]   Nurse Notes:  None

## 2022-05-31 ENCOUNTER — Other Ambulatory Visit: Payer: Self-pay | Admitting: Internal Medicine

## 2022-05-31 ENCOUNTER — Other Ambulatory Visit: Payer: Self-pay | Admitting: Family

## 2022-05-31 DIAGNOSIS — F411 Generalized anxiety disorder: Secondary | ICD-10-CM

## 2022-06-20 DIAGNOSIS — H25013 Cortical age-related cataract, bilateral: Secondary | ICD-10-CM | POA: Diagnosis not present

## 2022-06-20 DIAGNOSIS — H2513 Age-related nuclear cataract, bilateral: Secondary | ICD-10-CM | POA: Diagnosis not present

## 2022-06-20 DIAGNOSIS — H524 Presbyopia: Secondary | ICD-10-CM | POA: Diagnosis not present

## 2022-06-20 DIAGNOSIS — H353132 Nonexudative age-related macular degeneration, bilateral, intermediate dry stage: Secondary | ICD-10-CM | POA: Diagnosis not present

## 2022-06-20 DIAGNOSIS — H5203 Hypermetropia, bilateral: Secondary | ICD-10-CM | POA: Diagnosis not present

## 2022-06-29 ENCOUNTER — Other Ambulatory Visit: Payer: Self-pay | Admitting: Family

## 2022-06-29 DIAGNOSIS — F411 Generalized anxiety disorder: Secondary | ICD-10-CM

## 2022-07-06 ENCOUNTER — Telehealth: Payer: Self-pay | Admitting: Internal Medicine

## 2022-07-06 NOTE — Telephone Encounter (Signed)
Prescription Request  07/06/2022  LOV: Visit date not found  What is the name of the medication or equipment? Citalopram. Pt states she is completely out of medication.  Have you contacted your pharmacy to request a refill? Yes   Which pharmacy would you like this sent to?  [id number] - [place], [place] - [address] AT [facility] OF [address] [place] [address] Phone: [phone] Fax: [phone]    Patient notified that their request is being sent to the clinical staff for review and that they should receive a response within 2 business days.   Please advise at Mobile [phone] (mobile)

## 2022-07-06 NOTE — Telephone Encounter (Signed)
Spoke to pt. Pt has not seen in OV since 10/17/2020.   Scheduled an appointment on 07/13/2022 @10 :30am. Pt reports she didn't take her med since the weekend. Inform her, we could ask provider to see if can send in limited supply. Pt states she will hold on and wait till her appt day. No further action is needed at this time.

## 2022-07-12 NOTE — Progress Notes (Unsigned)
No chief complaint on file.   HPI: Nicole Wood 77 y.o. come in for  Med citaloppram CV  dr Anne Fu   palpitations ht toprol and losartan ROS: See pertinent positives and negatives per HPI.  Past Medical History:  Diagnosis Date   Basal cell cancer    nose   Cystocele    Depression    in the past   Hematuria    idiopathic   Hypertension    Osteoarthritis    Osteopenia    dexa 2009, -2.3 osteoporosis -2.5 hip 2011   Urinary incontinence     Family History  Problem Relation Age of Onset   Pulmonary embolism Mother    Depression Mother        major depressive disorder   Hip fracture Mother        about 33    Hypertension Mother    Cancer Father        bladder   Diabetes Brother    Hyperlipidemia Brother    Arthritis Brother        DJD   Arthritis Brother    Stroke Maternal Aunt    Breast cancer Maternal Aunt    Heart attack Neg Hx     Social History   Socioeconomic History   Marital status: Married    Spouse name: Not on file   Number of children: Not on file   Years of education: Not on file   Highest education level: Not on file  Occupational History   Not on file  Tobacco Use   Smoking status: Never   Smokeless tobacco: Never  Vaping Use   Vaping Use: Never used  Substance and Sexual Activity   Alcohol use: Not Currently   Drug use: No   Sexual activity: Yes    Partners: Female  Other Topics Concern   Not on file  Social History Narrative   Retired Diplomatic Services operational officer) some college   Married 4 children [place],, [place], and [place], and [place] Husband cancer MM. NHL in remission   Regular exercise- yes   HH of 2   No pets    Multiple myeloma and non hogkins lymphoma in husband x 6 years   Regular exercise: walk, swim, some biking   Caffeine use: coffee daily, sweet tea   Social Determinants of Health   Financial Resource Strain: Low Risk  (05/16/2022)   Overall Financial Resource Strain (CARDIA)    Difficulty of Paying Living Expenses:  Not hard at all  Food Insecurity: No Food Insecurity (05/16/2022)   Hunger Vital Sign    Worried About Running Out of Food in the Last Year: Never true    Ran Out of Food in the Last Year: Never true  Transportation Needs: No Transportation Needs (05/16/2022)   PRAPARE - Administrator, Civil Service (Medical): No    Lack of Transportation (Non-Medical): No  Physical Activity: Insufficiently Active (05/16/2022)   Exercise Vital Sign    Days of Exercise per Week: 3 days    Minutes of Exercise per Session: 30 min  Stress: No Stress Concern Present (05/16/2022)   [employer] of Occupational Health - Occupational Stress Questionnaire    Feeling of Stress : Not at all  Social Connections: Socially Integrated (05/16/2022)   Social Connection and Isolation Panel [NHANES]    Frequency of Communication with Friends and Family: More than three times a week    Frequency of Social Gatherings with Friends and Family: More than three  times a week    Attends Religious Services: More than 4 times per year    Active Member of Clubs or Organizations: Yes    Attends Banker Meetings: More than 4 times per year    Marital Status: Married    Outpatient Medications Prior to Visit  Medication Sig Dispense Refill   bifidobacterium infantis (ALIGN) capsule Take 1 capsule by mouth daily.     Cholecalciferol (VITAMIN D3) 5000 units CAPS Take 1 capsule by mouth daily.     citalopram (CELEXA) 10 MG tablet TAKE 1 TABLET(10 MG) BY MOUTH TWICE DAILY 60 tablet 0   LORazepam (ATIVAN) 0.5 MG tablet TAKE 1 TABLET(0.5 MG) BY MOUTH EVERY 8 HOURS AS NEEDED 24 tablet 0   losartan (COZAAR) 50 MG tablet TAKE 1 TABLET(50 MG) BY MOUTH DAILY 30 tablet 0   metoprolol succinate (TOPROL-XL) 50 MG 24 hr tablet Take 1 tablet (50 mg total) by mouth at bedtime. 90 tablet 2   Multiple Vitamins-Minerals (ICAPS PO) Take 1 capsule by mouth 2 (two) times daily.      naproxen sodium (ANAPROX) 220 MG tablet Take  220 mg by mouth 2 (two) times daily as needed (pain). ALEVE     polyethylene glycol (MIRALAX / GLYCOLAX) packet Take 8.5 g by mouth daily.     No facility-administered medications prior to visit.     EXAM:  LMP  (LMP Unknown)   There is no height or weight on file to calculate BMI.  GENERAL: vitals reviewed and listed above, alert, oriented, appears well hydrated and in no acute distress HEENT: atraumatic, conjunctiva  clear, no obvious abnormalities on inspection of external nose and ears OP : no lesion edema or exudate  NECK: no obvious masses on inspection palpation  LUNGS: clear to auscultation bilaterally, no wheezes, rales or rhonchi, good air movement CV: HRRR, no clubbing cyanosis or  peripheral edema nl cap refill  MS: moves all extremities without noticeable focal  abnormality PSYCH: pleasant and cooperative, no obvious depression or anxiety Lab Results  Component Value Date   WBC 7.4 02/16/2021   HGB 12.1 02/16/2021   HCT 36.8 02/16/2021   PLT 281.0 02/16/2021   GLUCOSE 82 02/16/2021   CHOL 194 02/16/2021   TRIG 114.0 02/16/2021   HDL 48.10 02/16/2021   LDLDIRECT 137.2 12/14/2010   LDLCALC 123 (H) 02/16/2021   ALT 11 02/16/2021   AST 15 02/16/2021   NA 136 02/16/2021   K 4.4 02/16/2021   CL 101 02/16/2021   CREATININE 0.79 02/16/2021   BUN 16 02/16/2021   CO2 31 02/16/2021   TSH 2.85 02/16/2021   HGBA1C 5.6 01/14/2015   BP Readings from Last 3 Encounters:  12/21/21 120/70  02/16/21 126/76  07/17/20 120/70    ASSESSMENT AND PLAN:  Discussed the following assessment and plan:  No diagnosis found.  -Patient advised to return or notify health care team  if  new concerns arise.  There are no Patient Instructions on file for this visit.   Neta Mends. Eljay Lave M.D.

## 2022-07-13 ENCOUNTER — Other Ambulatory Visit: Payer: Self-pay | Admitting: Internal Medicine

## 2022-07-13 ENCOUNTER — Encounter: Payer: Self-pay | Admitting: Internal Medicine

## 2022-07-13 ENCOUNTER — Ambulatory Visit (INDEPENDENT_AMBULATORY_CARE_PROVIDER_SITE_OTHER): Payer: Medicare Other | Admitting: Internal Medicine

## 2022-07-13 VITALS — BP 140/78 | HR 65 | Temp 97.6°F | Ht 64.0 in | Wt 141.0 lb

## 2022-07-13 DIAGNOSIS — E559 Vitamin D deficiency, unspecified: Secondary | ICD-10-CM

## 2022-07-13 DIAGNOSIS — I10 Essential (primary) hypertension: Secondary | ICD-10-CM

## 2022-07-13 DIAGNOSIS — R059 Cough, unspecified: Secondary | ICD-10-CM

## 2022-07-13 DIAGNOSIS — M810 Age-related osteoporosis without current pathological fracture: Secondary | ICD-10-CM

## 2022-07-13 DIAGNOSIS — F411 Generalized anxiety disorder: Secondary | ICD-10-CM

## 2022-07-13 DIAGNOSIS — Z79899 Other long term (current) drug therapy: Secondary | ICD-10-CM

## 2022-07-13 LAB — CBC WITH DIFFERENTIAL/PLATELET
Basophils Absolute: 0 10*3/uL (ref 0.0–0.1)
Basophils Relative: 0.5 % (ref 0.0–3.0)
Eosinophils Absolute: 0.2 10*3/uL (ref 0.0–0.7)
Eosinophils Relative: 4 % (ref 0.0–5.0)
HCT: 36.8 % (ref 36.0–46.0)
Hemoglobin: 12.3 g/dL (ref 12.0–15.0)
Lymphocytes Relative: 28.5 % (ref 12.0–46.0)
Lymphs Abs: 1.8 10*3/uL (ref 0.7–4.0)
MCHC: 33.5 g/dL (ref 30.0–36.0)
MCV: 95 fl (ref 78.0–100.0)
Monocytes Absolute: 0.7 10*3/uL (ref 0.1–1.0)
Monocytes Relative: 11.7 % (ref 3.0–12.0)
Neutro Abs: 3.4 10*3/uL (ref 1.4–7.7)
Neutrophils Relative %: 55.3 % (ref 43.0–77.0)
Platelets: 287 10*3/uL (ref 150.0–400.0)
RBC: 3.87 Mil/uL (ref 3.87–5.11)
RDW: 13.6 % (ref 11.5–15.5)
WBC: 6.2 10*3/uL (ref 4.0–10.5)

## 2022-07-13 LAB — VITAMIN D 25 HYDROXY (VIT D DEFICIENCY, FRACTURES): VITD: 19.55 ng/mL — ABNORMAL LOW (ref 30.00–100.00)

## 2022-07-13 LAB — COMPREHENSIVE METABOLIC PANEL
ALT: 13 U/L (ref 0–35)
AST: 18 U/L (ref 0–37)
Albumin: 4.1 g/dL (ref 3.5–5.2)
Alkaline Phosphatase: 79 U/L (ref 39–117)
BUN: 16 mg/dL (ref 6–23)
CO2: 29 mEq/L (ref 19–32)
Calcium: 9.5 mg/dL (ref 8.4–10.5)
Chloride: 104 mEq/L (ref 96–112)
Creatinine, Ser: 0.77 mg/dL (ref 0.40–1.20)
GFR: 74.44 mL/min (ref >60.00)
Glucose, Bld: 87 mg/dL (ref 70–99)
Potassium: 4.4 mEq/L (ref 3.5–5.1)
Sodium: 139 mEq/L (ref 135–145)
Total Bilirubin: 0.3 mg/dL (ref 0.2–1.2)
Total Protein: 6.9 g/dL (ref 6.0–8.3)

## 2022-07-13 LAB — LIPID PANEL
Cholesterol: 194 mg/dL (ref 0–200)
HDL: 42.5 mg/dL (ref >39.00)
LDL Cholesterol: 124 mg/dL — ABNORMAL HIGH (ref 0–99)
NonHDL: 151.55
Total CHOL/HDL Ratio: 5
Triglycerides: 136 mg/dL (ref 0.0–149.0)
VLDL: 27.2 mg/dL (ref 0.0–40.0)

## 2022-07-13 LAB — TSH: TSH: 2.27 u[IU]/mL (ref 0.35–5.50)

## 2022-07-13 MED ORDER — CITALOPRAM HYDROBROMIDE 10 MG PO TABS: ORAL_TABLET | ORAL | 0 refills | Status: DC

## 2022-07-13 MED ORDER — LOSARTAN POTASSIUM 50 MG PO TABS
ORAL_TABLET | ORAL | 0 refills | Status: DC
Start: 2022-07-13 — End: 2022-07-13

## 2022-07-13 MED ORDER — LORAZEPAM 0.5 MG PO TABS: ORAL_TABLET | ORAL | 0 refills | Status: AC

## 2022-07-13 NOTE — Patient Instructions (Addendum)
Can stay  off the citalopram for now since off for over 2 weeks  but will refill 30 days since  you may want to restart  if increasing anxiety symptoms  and then contact us .   Updated lab today . Cough  is from a viral chest cold and  lung exam is clear today . Comfort care   over the next week or so . Fu if concern .   Send in message or call about how doing  citalopram  off or on in next 1-2 months

## 2022-07-13 NOTE — Telephone Encounter (Signed)
Patient calling to check on progress, says she is worried she will run out of meds

## 2022-07-13 NOTE — Assessment & Plan Note (Signed)
Rare use  if ativan but would like updated rx  ran out of citalopram 15 days ago  going to try off . Refilled today incase   going back on med  She will give [place] update in a month or so about bp and  anxiety status and whether went back on citalopram

## 2022-07-17 NOTE — Progress Notes (Signed)
Results are stable or in range except VIt D lower than last year   even though you are taking 5000 iu vit d per day .  I suggest  rx vit D 3 50000IU  take 1 po once a week  disp 12 and refill x 1   then recheck vit d level in 3 months on this dosing

## 2022-07-19 ENCOUNTER — Other Ambulatory Visit: Payer: Self-pay

## 2022-07-19 DIAGNOSIS — E559 Vitamin D deficiency, unspecified: Secondary | ICD-10-CM

## 2022-07-19 MED ORDER — VITAMIN D (ERGOCALCIFEROL) 1.25 MG (50000 UNIT) PO CAPS
50000.0000 [IU] | ORAL_CAPSULE | ORAL | 1 refills | Status: DC
Start: 2022-07-19 — End: 2023-02-01

## 2022-08-16 DIAGNOSIS — L570 Actinic keratosis: Secondary | ICD-10-CM | POA: Diagnosis not present

## 2022-08-16 DIAGNOSIS — L821 Other seborrheic keratosis: Secondary | ICD-10-CM | POA: Diagnosis not present

## 2022-08-16 DIAGNOSIS — I8392 Asymptomatic varicose veins of left lower extremity: Secondary | ICD-10-CM | POA: Diagnosis not present

## 2022-08-16 DIAGNOSIS — D1801 Hemangioma of skin and subcutaneous tissue: Secondary | ICD-10-CM | POA: Diagnosis not present

## 2022-08-16 DIAGNOSIS — Z85828 Personal history of other malignant neoplasm of skin: Secondary | ICD-10-CM | POA: Diagnosis not present

## 2022-08-16 DIAGNOSIS — D692 Other nonthrombocytopenic purpura: Secondary | ICD-10-CM | POA: Diagnosis not present

## 2022-08-16 DIAGNOSIS — I8391 Asymptomatic varicose veins of right lower extremity: Secondary | ICD-10-CM | POA: Diagnosis not present

## 2022-08-26 IMAGING — MG MM DIGITAL SCREENING BILAT W/ TOMO AND CAD
8 series · 8 of 24 positions shown · non-contrast
Comparison: Previous exam(s).

CLINICAL DATA: Screening.

EXAM:
DIGITAL SCREENING BILATERAL MAMMOGRAM WITH TOMOSYNTHESIS AND CAD
TECHNIQUE: Bilateral screening digital craniocaudal and mediolateral oblique
mammograms were obtained. Bilateral screening digital breast
tomosynthesis was performed. The images were evaluated with
computer-aided detection.

[L CC synth-2D]
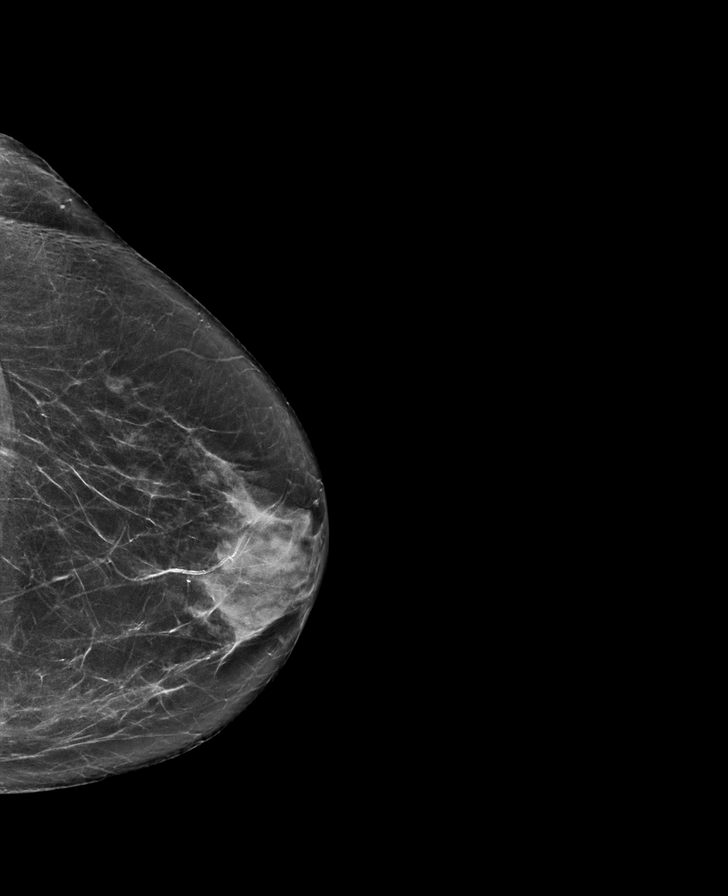

[R CC synth-2D]
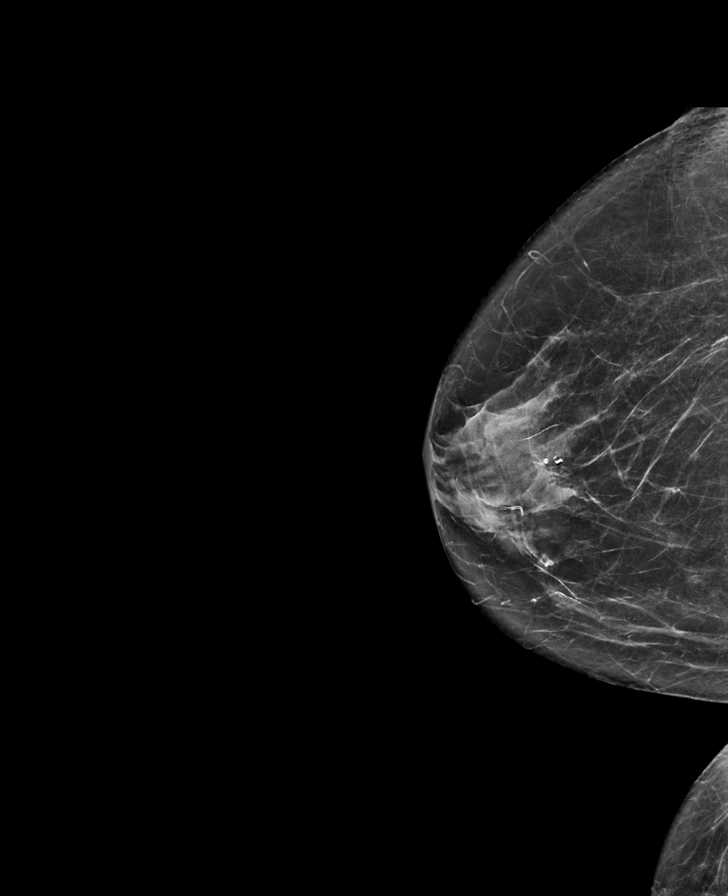

[L MLO synth-2D]
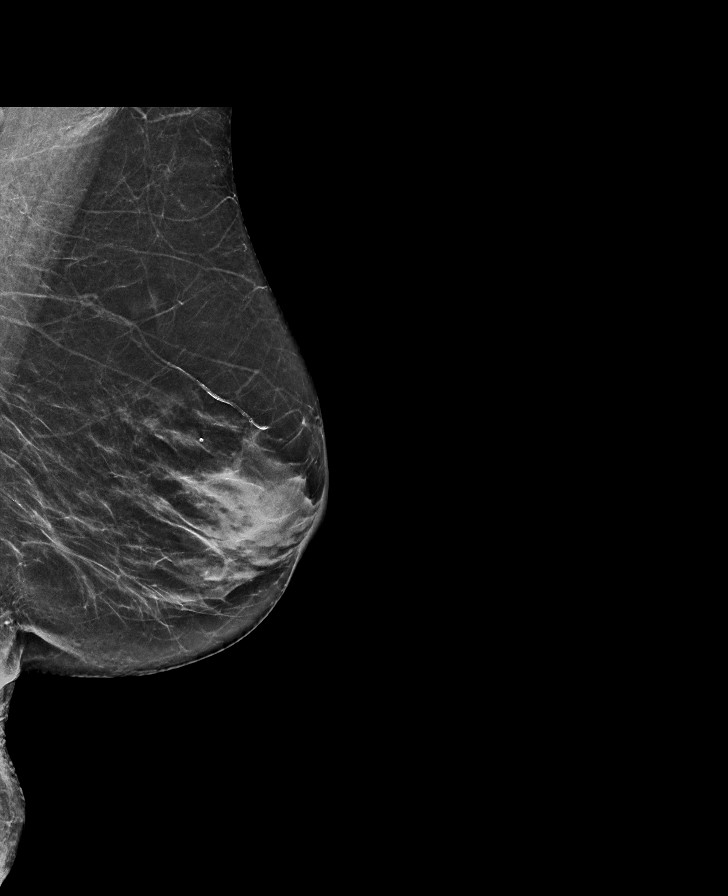

[R MLO synth-2D]
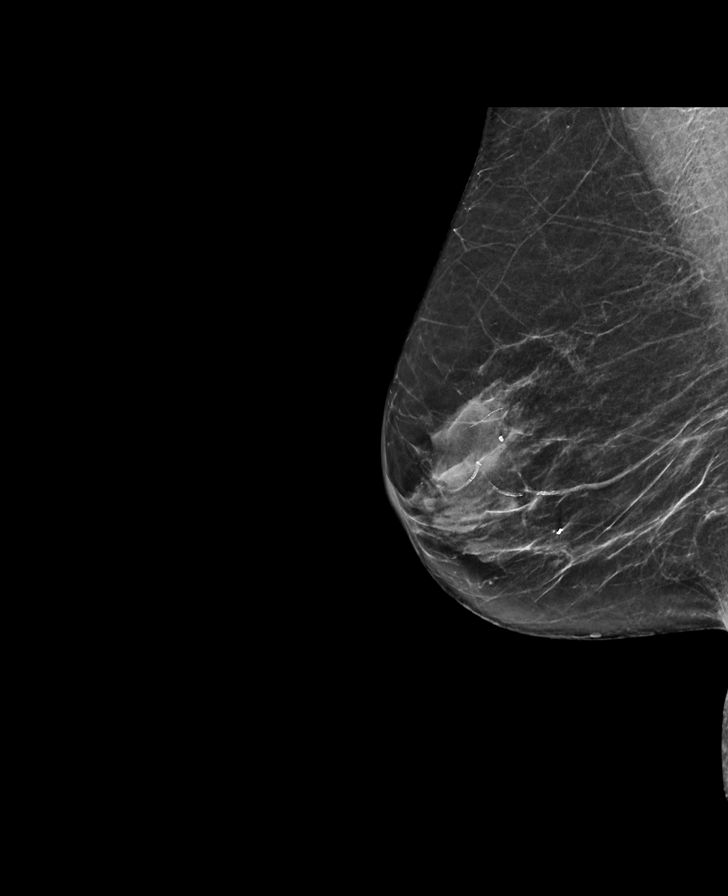

[R CC tomo · tomo slice 31/60.0]
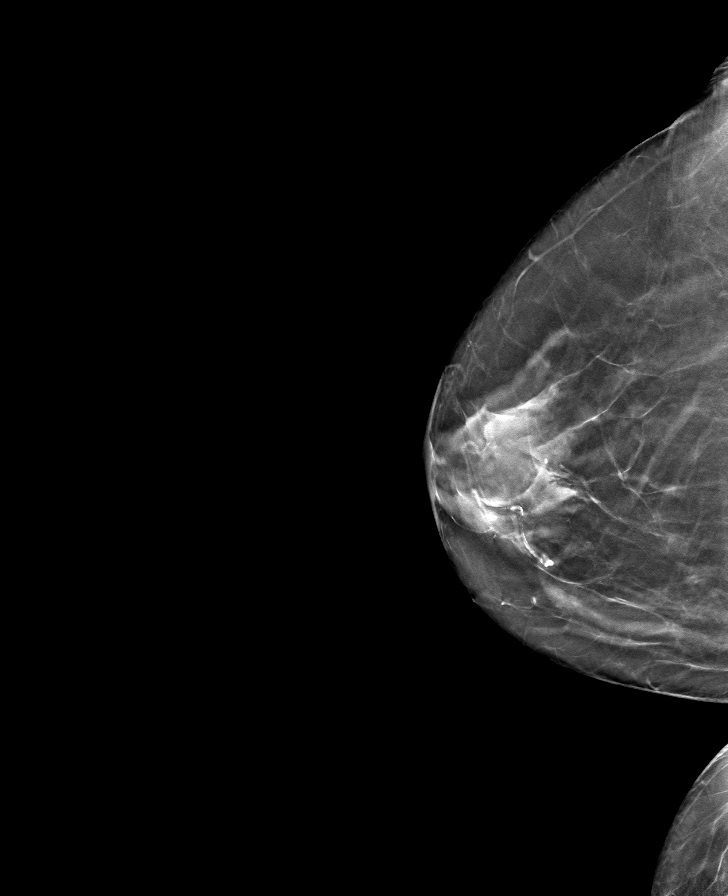

[L CC tomo · tomo slice 31/62.0]
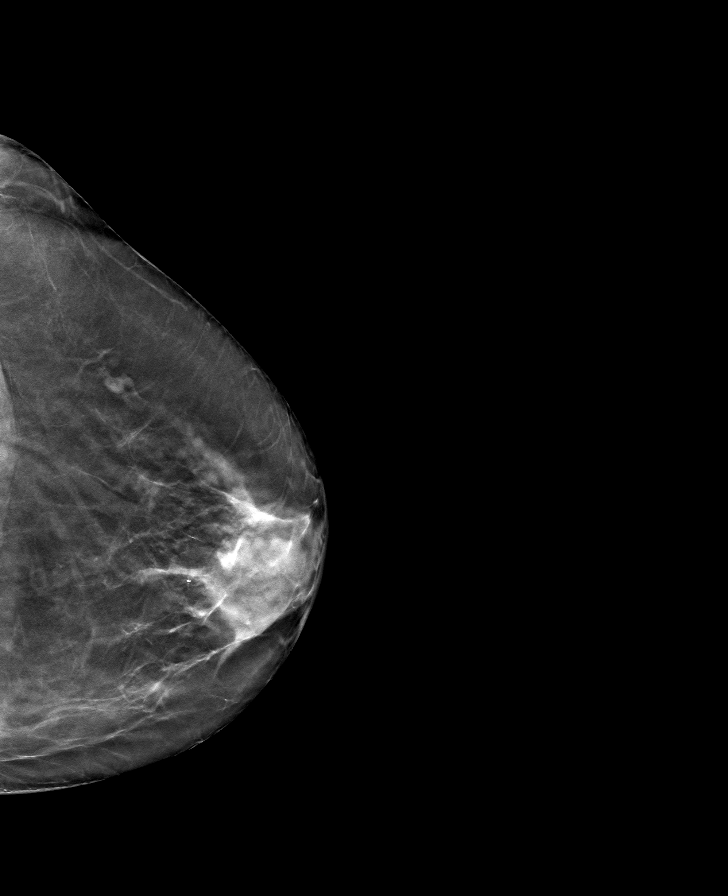

[L MLO tomo · tomo slice 31/62.0]
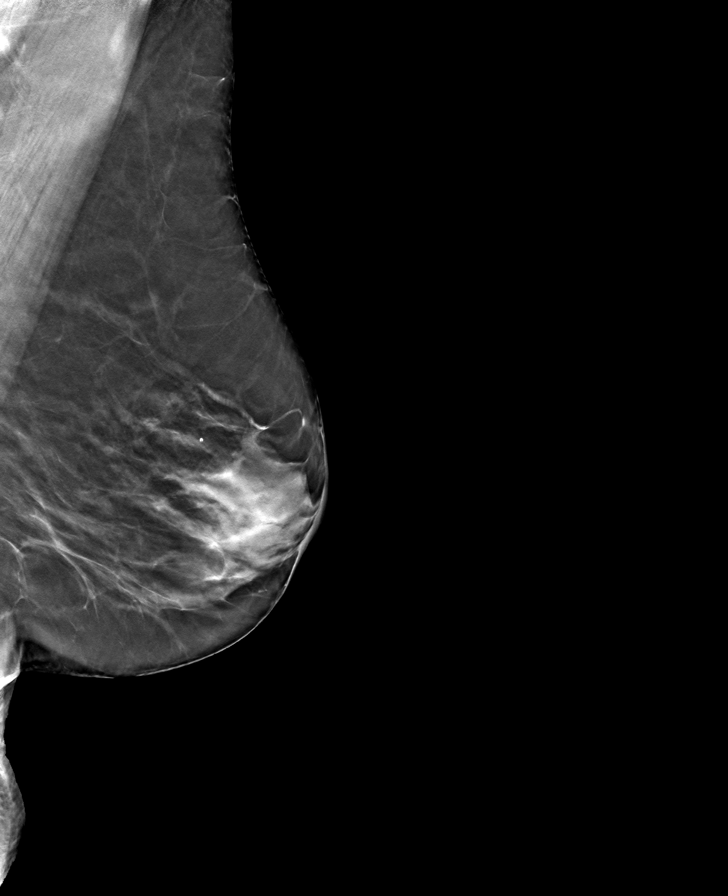

[R MLO tomo · tomo slice 31/60.0]
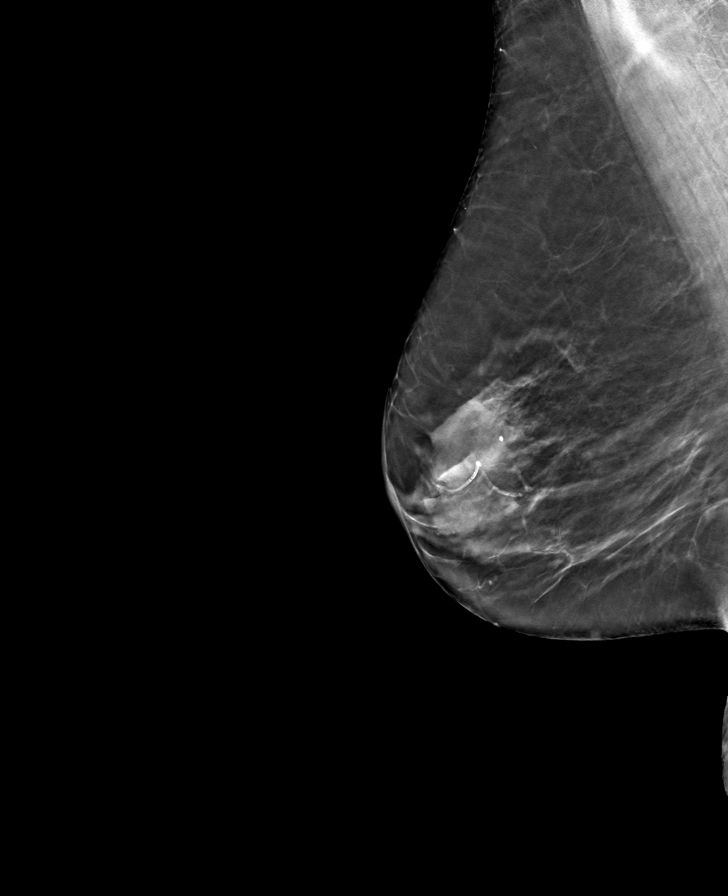

[8 of 24 positions shown; findings below may reference images not displayed]

ACR Breast Density Category c: The breast tissue is heterogeneously
dense, which may obscure small masses.
FINDINGS: There are no findings suspicious for malignancy.
IMPRESSION: No mammographic evidence of malignancy. A result letter of this
screening mammogram will be mailed directly to the patient.

RECOMMENDATION:
Screening mammogram in one year. (Code:Q3-W-BC3)

BI-RADS CATEGORY  1: Negative.

## 2022-09-07 ENCOUNTER — Other Ambulatory Visit: Payer: Self-pay | Admitting: Internal Medicine

## 2022-09-07 DIAGNOSIS — F411 Generalized anxiety disorder: Secondary | ICD-10-CM

## 2022-10-10 ENCOUNTER — Other Ambulatory Visit: Payer: Self-pay | Admitting: Family

## 2022-10-10 ENCOUNTER — Other Ambulatory Visit: Payer: Self-pay | Admitting: Cardiology

## 2022-10-10 DIAGNOSIS — I10 Essential (primary) hypertension: Secondary | ICD-10-CM

## 2022-10-18 NOTE — Telephone Encounter (Signed)
Pt checking on progress of this request for refill. Has ran out

## 2022-12-28 DIAGNOSIS — Z23 Encounter for immunization: Secondary | ICD-10-CM | POA: Diagnosis not present

## 2023-01-03 ENCOUNTER — Other Ambulatory Visit: Payer: Self-pay | Admitting: Internal Medicine

## 2023-01-03 DIAGNOSIS — I10 Essential (primary) hypertension: Secondary | ICD-10-CM

## 2023-01-11 ENCOUNTER — Other Ambulatory Visit: Payer: Self-pay | Admitting: Cardiology

## 2023-01-19 ENCOUNTER — Other Ambulatory Visit: Payer: Self-pay

## 2023-01-19 ENCOUNTER — Telehealth: Payer: Self-pay | Admitting: Cardiology

## 2023-01-19 MED ORDER — METOPROLOL SUCCINATE ER 50 MG PO TB24: 50.0000 mg | ORAL_TABLET | Freq: Every day | ORAL | 0 refills | Status: DC

## 2023-01-19 NOTE — Telephone Encounter (Signed)
*  STAT* If patient is at the pharmacy, call can be transferred to refill team.   1. Which medications need to be refilled? (please list name of each medication and dose if known)metoprolol succinate (TOPROL-XL) 50 MG 24 hr tablet  2. Which pharmacy/location (including street and city if local pharmacy) is medication to be sent to? [id number] - [place], [place] - [address] AT [address] [facility] OF [address]  3. Do they need a 30 day or 90 day supply? 90

## 2023-01-19 NOTE — Telephone Encounter (Signed)
 Rx sent to requested Pharmacy.

## 2023-02-01 ENCOUNTER — Ambulatory Visit: Payer: Medicare Other | Attending: Physician Assistant | Admitting: Physician Assistant

## 2023-02-01 ENCOUNTER — Encounter: Payer: Self-pay | Admitting: Physician Assistant

## 2023-02-01 VITALS — BP 120/60 | HR 64 | Ht 64.0 in | Wt 146.4 lb

## 2023-02-01 DIAGNOSIS — I10 Essential (primary) hypertension: Secondary | ICD-10-CM | POA: Insufficient documentation

## 2023-02-01 DIAGNOSIS — R002 Palpitations: Secondary | ICD-10-CM | POA: Diagnosis not present

## 2023-02-01 MED ORDER — METOPROLOL SUCCINATE ER 50 MG PO TB24: 50.0000 mg | ORAL_TABLET | Freq: Every day | ORAL | 3 refills | Status: DC

## 2023-02-01 NOTE — Assessment & Plan Note (Signed)
Overall stable on current regimen which includes Metoprolol succinate 50 mg once daily. If she has a change in frequency, we can arrange a follow up cardiac monitor.

## 2023-02-01 NOTE — Assessment & Plan Note (Signed)
BP well controlled on current regimen which includes Metoprolol succinate and Losartan.

## 2023-02-01 NOTE — Patient Instructions (Signed)
Medication Instructions:  Your physician recommends that you continue on your current medications as directed. Please refer to the Current Medication list given to you today.  *If you need a refill on your cardiac medications before your next appointment, please call your pharmacy*   Lab Work: None ordered  If you have labs (blood work) drawn today and your tests are completely normal, you will receive your results only by: MyChart Message (if you have MyChart) OR A paper copy in the mail If you have any lab test that is abnormal or we need to change your treatment, we will call you to review the results.   Testing/Procedures: None ordered   Follow-Up: At [facility], you and your health needs are our priority.  As part of our continuing mission to provide you with exceptional heart care, we have created designated Provider Care Teams.  These Care Teams include your primary Cardiologist (physician) and Advanced Practice Providers (APPs -  Physician Assistants and Nurse Practitioners) who all work together to provide you with the care you need, when you need it.  We recommend signing up for the patient portal called "MyChart".  Sign up information is provided on this After Visit Summary.  MyChart is used to connect with patients for Virtual Visits (Telemedicine).  Patients are able to view lab/test results, encounter notes, upcoming appointments, etc.  Non-urgent messages can be sent to your provider as well.   To learn more about what you can do with MyChart, go to [email or web address].    Your next appointment:   1 year(s)  Provider:   Jove Skains, MD     Other Instructions   

## 2023-02-01 NOTE — Progress Notes (Signed)
Cardiology Office Note:    Date:  02/01/2023  ID:  Nicole Wood, DOB 21-Jan-1946, MRN [id number] PCP: Madelin Headings, MD  [facility] Providers Cardiologist:  Donato Schultz, MD       Patient Profile:      Palpitations Monitor 03/2016: NSR, no A-fib Hypertension        History of Present Illness:  Discussed the use of AI scribe software for clinical note transcription with the patient, who gave verbal consent to proceed.  Nicole Wood is a 77 y.o. female who returns for follow-up of palpitations, hypertension.  She was last seen by Dr. Anne Fu in October 2023. She is here alone. Her palpitations are overall stable. She reports experiencing intermittent episodes of what she describes as a "jumpy heart." These episodes, characterized by a sensation of heart racing, occur sporadically and can last up to five minutes. The patient notes that these episodes can occur even when she is calm and at rest. During these episodes, she attempts to manage the symptoms through deep breathing and occasionally takes half an Ativan if the episode persists for more than five minutes. The patient reports no changes in the frequency or intensity of these episodes over the past year. The patient denies experiencing any chest pain, shortness of breath, or syncope. She remains active and enjoys riding her e-bike. She is also planning to undergo cataract surgery in the near future.     Review of Systems  Cardiovascular:  Negative for claudication.  See HPI    Studies Reviewed:   EKG Interpretation Date/Time:  Wednesday February 01 2023 14:06:29 EST Ventricular Rate:  64 PR Interval:  200 QRS Duration:  88 QT Interval:  402 QTC Calculation: 414 R Axis:   -12  Text Interpretation: Normal sinus rhythm Low voltage QRS No significant change since last tracing Confirmed by Tereso Newcomer [phone]) on 02/01/2023 2:34:58 PM    Labs reviewed - Chart Review 07/13/22: LDL 124, Hgb 12.3, K 4.4, SCr 0.77, ALT 13,  TSH 2.27         Risk Assessment/Calculations:             Physical Exam:   VS:  BP 120/60   Pulse 64   Ht 5\' 4"  (1.626 m)   Wt 146 lb 6.4 oz (66.4 kg)   LMP  (LMP Unknown)   SpO2 94%   BMI 25.13 kg/m    Wt Readings from Last 3 Encounters:  02/01/23 146 lb 6.4 oz (66.4 kg)  07/13/22 141 lb (64 kg)  05/16/22 141 lb (64 kg)    Constitutional:      Appearance: Healthy appearance. Not in distress.  Neck:     Vascular: No carotid bruit. JVD normal.  Pulmonary:     Breath sounds: Normal breath sounds. No wheezing. No rales.  Cardiovascular:     Normal rate. Regular rhythm.     Murmurs: There is no murmur.  Edema:    Peripheral edema absent.  Abdominal:     Palpations: Abdomen is soft.         Assessment and Plan:   Assessment & Plan Heart palpitations Overall stable on current regimen which includes Metoprolol succinate 50 mg once daily. If she has a change in frequency, we can arrange a follow up cardiac monitor.  Primary hypertension BP well controlled on current regimen which includes Metoprolol succinate and Losartan.        Dispo:  Return in about 1 year (around 02/01/2024) for  Routine Follow Up, w/ Dr. Anne Fu.  Signed, Tereso Newcomer, PA-C

## 2023-02-06 DIAGNOSIS — H25013 Cortical age-related cataract, bilateral: Secondary | ICD-10-CM | POA: Diagnosis not present

## 2023-02-06 DIAGNOSIS — H2513 Age-related nuclear cataract, bilateral: Secondary | ICD-10-CM | POA: Diagnosis not present

## 2023-03-05 ENCOUNTER — Other Ambulatory Visit: Payer: Self-pay | Admitting: Internal Medicine

## 2023-03-05 DIAGNOSIS — F411 Generalized anxiety disorder: Secondary | ICD-10-CM

## 2023-03-31 ENCOUNTER — Other Ambulatory Visit: Payer: Self-pay | Admitting: Family

## 2023-03-31 DIAGNOSIS — I10 Essential (primary) hypertension: Secondary | ICD-10-CM

## 2023-04-17 ENCOUNTER — Other Ambulatory Visit: Payer: Self-pay | Admitting: Internal Medicine

## 2023-04-17 DIAGNOSIS — I10 Essential (primary) hypertension: Secondary | ICD-10-CM

## 2023-04-17 MED ORDER — LOSARTAN POTASSIUM 50 MG PO TABS: ORAL_TABLET | ORAL | 0 refills | Status: DC

## 2023-04-17 NOTE — Telephone Encounter (Signed)
Copied from CRM [id number]. Topic: Clinical - Medication Refill >> Apr 17, 2023 10:28 AM Roberto Scales wrote: Most Recent Primary Care Visit:  Provider: Berniece Andreas K  Department: [facility]  Visit Type: OFFICE VISIT  Date: 07/13/2022  Medication: losartan (COZAAR) 50 MG tablet  Has the patient contacted their pharmacy? Yes (Agent: If no, request that the patient contact the pharmacy for the refill. If patient does not wish to contact the pharmacy document the reason why and proceed with request.) (Agent: If yes, when and what did the pharmacy advise?)  Is this the correct pharmacy for this prescription? Yes If no, delete pharmacy and type the correct one.  This is the patient's preferred pharmacy:  [id number] - [place], [place] - [address] AT [facility] OF [address] [place] [address] Phone: [phone] Fax: [phone]   Has the prescription been filled recently? No  Is the patient out of the medication? Yes  Has the patient been seen for an appointment in the last year OR does the patient have an upcoming appointment? Yes  Can we respond through MyChart? Yes  Agent: Please be advised that Rx refills may take up to 3 business days. We ask that you follow-up with your pharmacy.

## 2023-04-18 DIAGNOSIS — Z961 Presence of intraocular lens: Secondary | ICD-10-CM | POA: Diagnosis not present

## 2023-04-18 DIAGNOSIS — H52201 Unspecified astigmatism, right eye: Secondary | ICD-10-CM | POA: Diagnosis not present

## 2023-04-18 DIAGNOSIS — H25811 Combined forms of age-related cataract, right eye: Secondary | ICD-10-CM | POA: Diagnosis not present

## 2023-04-18 DIAGNOSIS — H25011 Cortical age-related cataract, right eye: Secondary | ICD-10-CM | POA: Diagnosis not present

## 2023-04-18 DIAGNOSIS — H2511 Age-related nuclear cataract, right eye: Secondary | ICD-10-CM | POA: Diagnosis not present

## 2023-05-02 DIAGNOSIS — Z961 Presence of intraocular lens: Secondary | ICD-10-CM | POA: Diagnosis not present

## 2023-05-02 DIAGNOSIS — H25812 Combined forms of age-related cataract, left eye: Secondary | ICD-10-CM | POA: Diagnosis not present

## 2023-05-02 DIAGNOSIS — H2512 Age-related nuclear cataract, left eye: Secondary | ICD-10-CM | POA: Diagnosis not present

## 2023-05-02 DIAGNOSIS — H52202 Unspecified astigmatism, left eye: Secondary | ICD-10-CM | POA: Diagnosis not present

## 2023-05-02 DIAGNOSIS — H25012 Cortical age-related cataract, left eye: Secondary | ICD-10-CM | POA: Diagnosis not present

## 2023-05-30 ENCOUNTER — Other Ambulatory Visit: Payer: Self-pay | Admitting: Internal Medicine

## 2023-05-30 DIAGNOSIS — Z1231 Encounter for screening mammogram for malignant neoplasm of breast: Secondary | ICD-10-CM

## 2023-06-15 ENCOUNTER — Ambulatory Visit
Admission: RE | Admit: 2023-06-15 | Discharge: 2023-06-15 | Disposition: A | Discharge status: Home / Self Care | Source: Ambulatory Visit | Attending: Internal Medicine | Admitting: Internal Medicine

## 2023-06-15 DIAGNOSIS — Z1231 Encounter for screening mammogram for malignant neoplasm of breast: Secondary | ICD-10-CM | POA: Diagnosis not present

## 2023-07-03 ENCOUNTER — Other Ambulatory Visit: Payer: Self-pay | Admitting: Family

## 2023-07-03 DIAGNOSIS — I10 Essential (primary) hypertension: Secondary | ICD-10-CM

## 2023-07-11 ENCOUNTER — Encounter: Payer: Self-pay | Admitting: Family Medicine

## 2023-07-11 ENCOUNTER — Ambulatory Visit (INDEPENDENT_AMBULATORY_CARE_PROVIDER_SITE_OTHER): Admitting: Family Medicine

## 2023-07-11 DIAGNOSIS — Z0000 Encounter for general adult medical examination without abnormal findings: Secondary | ICD-10-CM | POA: Diagnosis not present

## 2023-07-11 DIAGNOSIS — E2839 Other primary ovarian failure: Secondary | ICD-10-CM

## 2023-07-11 DIAGNOSIS — Z1211 Encounter for screening for malignant neoplasm of colon: Secondary | ICD-10-CM

## 2023-07-11 NOTE — Progress Notes (Signed)
 PATIENT CHECK-IN and HEALTH RISK ASSESSMENT QUESTIONNAIRE:  -completed by phone/video for upcoming Medicare Preventive Visit  Pre-Visit Check-in: 1)Vitals (height, wt, BP, etc) - record in vitals section for visit on day of visit Request home vitals (wt, BP, etc.) and enter into vitals, THEN update Vital Signs SmartPhrase below at the top of the HPI. See below.  2)Review and Update Medications, Allergies PMH, Surgeries, Social history in Epic 3)Hospitalizations in the last year with date/reason? NO   4)Review and Update Care Team (patient's specialists) in Epic 5) Complete PHQ9 in Epic  6) Complete Fall Screening in Epic 7)Review all Health Maintenance Due and order under PCP if not done.  Medicare Wellness Patient Questionnaire:  Answer theses question about your habits: How often do you have a drink containing alcohol?NA  How many drinks containing alcohol do you have on a typical day when you are drinking?NA  How often do you have six or more drinks on one occasion?NA  Have you ever smoked?NO  Quit date if applicable? NA  How many packs a day do/did you smoke? NA  Do you use smokeless tobacco?NA  Do you use an illicit drugs?NA  On average, how many days per week do you engage in moderate to strenuous exercise (like a brisk walk)?5 days, walks outside, does some strength training 2-3 days per week at home - has hand weights On average, how many minutes do you engage in exercise at this level?30 mins  Are you sexually active? Yes Number of partners?one  Typical breakfast: Oatmeal with milk, nuts, fruit and coffee, or eggs and toast  Typical lunch:Yogurt and fruit, or a salad and fruit  Typical dinner: black beans and rice and green beans, or chicken or fish  Typical snacks: Fruit, Nuts, Hummus  Beverages: Milk, coffee, water, sprite - in moderation!, drinks 8 glasses of water a day Does lots of activities, knitting, furniture repair, 8 grandchildren, lots of social  interaction  Answer theses question about your everyday activities: Can you perform most household chores?Yes  Are you deaf or have significant trouble hearing?NO  Do you feel that you have a problem with memory?NO  Do you feel safe at home?Yes  Last dentist visit? May 2025 8. Do you have any difficulty performing your everyday activities?NO  Are you having any difficulty walking, taking medications on your own, and or difficulty managing daily home needs?NO  Do you have difficulty walking or climbing stairs?NO  Do you have difficulty dressing or bathing?NO  Do you have difficulty doing errands alone such as visiting a doctor's office or shopping?NO  Do you currently have any difficulty preparing food and eating?NO  Do you currently have any difficulty using the toilet?NO  Do you have any difficulty managing your finances?NO  Do you have any difficulties with housekeeping of managing your housekeeping?NO    Do you have Advanced Directives in place (Living Will, Healthcare Power or Attorney)? no   Last eye Exam and location?Feb 2025   Do you currently use prescribed or non-prescribed narcotic or opioid pain medications?NO   Do you have a history or close family history of breast, ovarian, tubal or peritoneal cancer or a family member with BRCA (breast cancer susceptibility 1 and 2) gene mutations?no   Nurse/Assistant Credentials/time stamp: Georga Killings CMA 11:30 am     ----------------------------------------------------------------------------------------------------------------------------------------------------------------------------------------------------------------------  Because this visit was a virtual/telehealth visit, some criteria may be missing or patient reported. Any vitals not documented were not able to be obtained and vitals that have  been documented are patient reported.    MEDICARE ANNUAL PREVENTIVE VISIT WITH PROVIDER: (Welcome to Medicare, initial annual  wellness or annual wellness exam)  Virtual Visit via Phone Note  I connected with Nicole Wood on 07/11/23 by phone and verified that I am speaking with the correct person using two identifiers. She preferred phone.   Location patient: home Location provider:work or home office Persons participating in the virtual visit: patient, provider  Concerns and/or follow up today: feels is doing well, stable. Sees her cardiologist for occ palpitations - unchanged.    See HM section in Epic for other details of completed HM.    ROS: negative for report of fevers, unintentional weight loss, vision changes, vision loss, hearing loss or change, chest pain, sob, hemoptysis, melena, hematochezia, hematuria, falls, bleeding or bruising, thoughts of suicide or self harm, memory loss  Patient-completed extensive health risk assessment - reviewed and discussed with the patient: See Health Risk Assessment completed with patient prior to the visit either above or in recent phone note. This was reviewed in detailed with the patient today and appropriate recommendations, orders and referrals were placed as needed per Summary below and patient instructions.   Review of Medical History: -PMH, PSH, Family History and current specialty and care providers reviewed and updated and listed below   Patient Care Team: Panosh, Joaquim Muir, MD as PCP - General Renna Cary Myrtle Atta, MD as PCP - Cardiology (Cardiology) Avis Boehringer, MD (Dermatology) Jacalyn Martin, MD as Attending Physician (Oral Surgery) Hugh Madura, MD as Consulting Physician (Cardiology)   Past Medical History:  Diagnosis Date   Basal cell cancer    nose   Cystocele    Depression    in the past   Hematuria    idiopathic   Hypertension    Osteoarthritis    Osteopenia    dexa 2009, -2.3 osteoporosis -2.5 hip 2011   Urinary incontinence     Past Surgical History:  Procedure Laterality Date   ABDOMINAL HYSTERECTOMY     fibroids and cyst right  ovary left oopherectomy   basal cell ca removed     bladder tack     BREAST BIOPSY Left    benign   rt shoulder surgery   2004   [facility]   URETHRAL DILATION      Social History   Socioeconomic History   Marital status: Married    Spouse name: Not on file   Number of children: Not on file   Years of education: Not on file   Highest education level: Not on file  Occupational History   Not on file  Tobacco Use   Smoking status: Never   Smokeless tobacco: Never  Vaping Use   Vaping status: Never Used  Substance and Sexual Activity   Alcohol use: Not Currently   Drug use: No   Sexual activity: Yes    Partners: Female  Other Topics Concern   Not on file  Social History Narrative   Retired Diplomatic Services operational officer) some college   Married 4 children [place], and [place], and GC Husband cancer MM. NHL in remission   Regular exercise- yes   HH of 2   No pets    Multiple myeloma and non hogkins lymphoma in husband x 6 years   Regular exercise: walk, swim, some biking   Caffeine use: coffee daily, sweet tea   Social Drivers of Corporate investment banker Strain: Low Risk  (05/16/2022)   Overall Financial Resource  Strain (CARDIA)    Difficulty of Paying Living Expenses: Not hard at all  Food Insecurity: No Food Insecurity (05/16/2022)   Hunger Vital Sign    Worried About Running Out of Food in the Last Year: Never true    Ran Out of Food in the Last Year: Never true  Transportation Needs: No Transportation Needs (05/16/2022)   PRAPARE - Administrator, Civil Service (Medical): No    Lack of Transportation (Non-Medical): No  Physical Activity: Insufficiently Active (05/16/2022)   Exercise Vital Sign    Days of Exercise per Week: 3 days    Minutes of Exercise per Session: 30 min  Stress: No Stress Concern Present (05/16/2022)   [employer] of Occupational Health - Occupational Stress Questionnaire    Feeling of Stress : Not at all  Social Connections:  Socially Integrated (05/16/2022)   Social Connection and Isolation Panel [NHANES]    Frequency of Communication with Friends and Family: More than three times a week    Frequency of Social Gatherings with Friends and Family: More than three times a week    Attends Religious Services: More than 4 times per year    Active Member of [employer] or Organizations: Yes    Attends Engineer, structural: More than 4 times per year    Marital Status: Married  Catering manager Violence: Not At Risk (05/16/2022)   Humiliation, Afraid, Rape, and Kick questionnaire    Fear of Current or Ex-Partner: No    Emotionally Abused: No    Physically Abused: No    Sexually Abused: No    Family History  Problem Relation Age of Onset   Pulmonary embolism Mother    Depression Mother        major depressive disorder   Hip fracture Mother        about 9    Hypertension Mother    Cancer Father        bladder   Diabetes Brother    Hyperlipidemia Brother    Arthritis Brother        DJD   Arthritis Brother    Stroke Maternal Aunt    Breast cancer Maternal Aunt    Heart attack Neg Hx     Current Outpatient Medications on File Prior to Visit  Medication Sig Dispense Refill   bifidobacterium infantis (ALIGN) capsule Take 1 capsule by mouth daily.     Cholecalciferol (VITAMIN D3) 5000 units CAPS Take 1 capsule by mouth daily.     citalopram  (CELEXA ) 10 MG tablet TAKE 1 TABLET(10 MG) BY MOUTH TWICE DAILY 180 tablet 1   LORazepam  (ATIVAN ) 0.5 MG tablet TAKE 1 TABLET(0.5 MG) BY MOUTH EVERY 8 HOURS AS NEEDED 24 tablet 0   losartan  (COZAAR ) 50 MG tablet TAKE 1 TABLET(50 MG) BY MOUTH DAILY 90 tablet 0   metoprolol  succinate (TOPROL -XL) 50 MG 24 hr tablet Take 1 tablet (50 mg total) by mouth daily. 90 tablet 3   Multiple Vitamins-Minerals (ICAPS PO) Take 1 capsule by mouth daily.     naproxen sodium (ANAPROX) 220 MG tablet Take 220 mg by mouth 2 (two) times daily as needed (pain). ALEVE     polyethylene glycol  (MIRALAX / GLYCOLAX) packet Take 8.5 g by mouth daily as needed for mild constipation or moderate constipation.     No current facility-administered medications on file prior to visit.    Allergies  Allergen Reactions   Contrast Media [Iodinated Contrast Media] Other (See Comments)  Gums got numb after a procedure   Lisinopril  Cough    Ok on  losartan    Penicillins Rash       Physical Exam Vitals requested from patient and listed below if patient had equipment and was able to obtain at home for this virtual visit: There were no vitals filed for this visit. Estimated body mass index is 25.13 kg/m as calculated from the following:   Height as of 02/01/23: 5\' 4"  (1.626 m).   Weight as of 02/01/23: 146 lb 6.4 oz (66.4 kg).  EKG (optional): deferred due to virtual visit  GENERAL: alert, oriented, no acute distress detected, full vision exam deferred due to pandemic and/or virtual encounter  PSYCH/NEURO: pleasant and cooperative, no obvious depression or anxiety, speech and thought processing grossly intact, Cognitive function grossly intact  Flowsheet Row Office Visit from [facility]/11/2023 in [facility] [place]  PHQ-9 Total Score 0           07/11/2023   11:11 AM 05/16/2022    9:22 AM 05/03/2021   10:46 AM 02/16/2021   10:05 AM 10/18/2019    9:59 AM  Depression screen PHQ 2/9  Decreased Interest 0 0 0 0 0  Down, Depressed, Hopeless 0 0 0 0 0  PHQ - 2 Score 0 0 0 0 0  Altered sleeping 0   0 0  Tired, decreased energy 0   0 0  Change in appetite 0   0 0  Feeling bad or failure about yourself  0   0 0  Trouble concentrating 0   0 0  Moving slowly or fidgety/restless 0   0 0  Suicidal thoughts    0 0  PHQ-9 Score 0   0 0  Difficult doing work/chores     Not difficult at all       05/02/2021    5:20 PM 05/03/2021   10:46 AM 05/16/2022    9:23 AM 07/11/2023   11:10 AM 07/11/2023   11:42 AM  Fall Risk  Falls in the past year? 0 0 1 0 1  Was there an  injury with Fall? 0  0 0 0  Fall Risk Category Calculator 0  1 0 1  Fall Risk Category (Retired) Low      Patient at Risk for Falls Due to  Medication side effect No Fall Risks    Fall risk Follow up  Falls evaluation completed;Education provided;Falls prevention discussed Falls prevention discussed Falls evaluation completed Falls evaluation completed;Education provided     SUMMARY AND PLAN:  Encounter for Medicare annual wellness exam  Colon cancer screening - Plan: Ambulatory referral to Gastroenterology  Estrogen deficiency - Plan: DG Bone Density  Discussed applicable health maintenance/preventive health measures and advised and referred or ordered per patient preferences: -discussed colon cancer screening and sent referral per her preference -discussed bone density and sent referral per her preference, she understand if abnormal will need to see Dr. Ethel Henry for treatment -discussed vaccines due risks and benefits, she plans to do at the pharmacy, advised to please provide record when she does so that we can update chart Health Maintenance  Topic Date Due   Zoster Vaccines- Shingrix (1 of 2) 03/03/1964   DTaP/Tdap/Td (2 - Tdap) 02/28/2018   Colonoscopy  12/02/2019   COVID-19 Vaccine (4 - 2024-25 season) 07/27/2023 (Originally 10/30/2022)   INFLUENZA VACCINE  09/29/2023   Medicare Annual Wellness (AWV)  07/10/2024   Pneumonia Vaccine 2+ Years old  Completed  DEXA SCAN  Completed   Hepatitis C Screening  Completed   HPV VACCINES  Aged Out   Meningococcal B Vaccine  Aged Out      Education and counseling on the following was provided based on the above review of health and a plan/checklist for the patient, along with additional information discussed, was provided for the patient in the patient instructions :  -Advised on importance of completing advanced directives, discussed options for completing and provided information in patient instructions as well -Provided counseling  and plan for increased risk of falling if applicable per above screening. Reviewed and safe balance exercises that can be done at home to improve balance and discussed exercise guidelines for adults with include balance exercises at least 3 days per week.  -Advised and counseled on a healthy lifestyle - including the importance of a healthy diet, regular physical activity, social connections  -Reviewed patient's current diet. Advised and counseled on a whole foods based healthy diet. A summary of a healthy diet was provided in the Patient Instructions.  -reviewed patient's current physical activity level and discussed exercise guidelines for adults. Discussed community resources and ideas for safe exercise at home to assist in meeting exercise guideline recommendations in a safe and healthy way.  -Advise yearly dental visits at minimum and regular eye exams  Follow up: see patient instructions     Patient Instructions  I really enjoyed getting to talk with you today! I am available on Tuesdays and Thursdays for virtual visits if you have any questions or concerns, or if I can be of any further assistance.   CHECKLIST FROM ANNUAL WELLNESS VISIT:  -Follow up (please call to schedule if not scheduled after visit):   -yearly for annual wellness visit with primary care office  Here is a list of your preventive care/health maintenance measures and the plan for each if any are due:  PLAN For any measures below that may be due:  -we sent a referral for the colonoscopy, please call us  if you have not been contacted in the next 2 weeks -can get the vaccines at the pharmacy, please let us  know when you do so that we can update your record   Health Maintenance  Topic Date Due   Zoster Vaccines- Shingrix (1 of 2) 03/03/1964   DTaP/Tdap/Td (2 - Tdap) 02/28/2018   Colonoscopy  12/02/2019   COVID-19 Vaccine (4 - 2024-25 season) 07/27/2023 (Originally 10/30/2022)   INFLUENZA VACCINE  09/29/2023    Medicare Annual Wellness (AWV)  07/10/2024   Pneumonia Vaccine 55+ Years old  Completed   DEXA SCAN  Completed   Hepatitis C Screening  Completed   HPV VACCINES  Aged Out   Meningococcal B Vaccine  Aged Out    -See a dentist at least yearly  -Get your eyes checked and then per your eye specialist's recommendations  -I have included below further information regarding a healthy whole foods based diet, physical activity guidelines for adults, stress management and opportunities for social connections. I hope you find this information useful.   -----------------------------------------------------------------------------------------------------------------------------------------------------------------------------------------------------------------------------------------------------------    NUTRITION: -eat real food: lots of colorful vegetables (half the plate) and fruits -5-7 servings of vegetables and fruits per day (fresh or steamed is best), exp. 2 servings of vegetables with lunch and dinner and 2 servings of fruit per day. Berries and greens such as kale and collards are great choices.  -consume on a regular basis:  fresh fruits, fresh veggies, fish, nuts, seeds, healthy oils (such  as olive oil, avocado oil), whole grains (make sure for bread/pasta/crackers/etc., that the first ingredient on label contains the word "whole"), legumes. -can eat small amounts of dairy and lean meat (no larger than the palm of your hand), but avoid processed meats such as ham, bacon, lunch meat, etc. -drink water -try to avoid fast food and pre-packaged foods, processed meat, ultra processed foods/beverages (donuts, candy, etc.) -most experts advise limiting sodium to < 2300mg  per day, should limit further is any chronic conditions such as high blood pressure, heart disease, diabetes, etc. The American Heart Association advised that < 1500mg  is is ideal -try to avoid foods/beverages that contain any  ingredients with names you do not recognize  -try to avoid foods/beverages  with added sugar or sweeteners/sweets  -try to avoid sweet drinks (including diet drinks): soda, juice, Gatorade, sweet tea, power drinks, diet drinks -try to avoid white rice, white bread, pasta (unless whole grain)  EXERCISE GUIDELINES FOR ADULTS: -if you wish to increase your physical activity, do so gradually and with the approval of your doctor -STOP and seek medical care immediately if you have any chest pain, chest discomfort or trouble breathing when starting or increasing exercise  -move and stretch your body, legs, feet and arms when sitting for long periods -Physical activity guidelines for optimal health in adults: -get at least 150 minutes per week of moderate exercise (can talk, but not sing); this is about 20-30 minutes of sustained activity 5-7 days per week or two 10-15 minute episodes of sustained activity 5-7 days per week -do some muscle building/resistance training/strength training at least 2 days per week  -balance exercises 3+ days per week:   Stand somewhere where you have something sturdy to hold onto if you lose balance    1) lift up on toes, then back down, start with 5x per day and work up to 20x   2) stand and lift one leg straight out to the side so that foot is a few inches of the floor, start with 5x each side and work up to 20x each side   3) stand on one foot, start with 5 seconds each side and work up to 20 seconds on each side  If you need ideas or help with getting more active:  -Silver sneakers https://tools.silversneakers.com  -Walk with a Doc: http://www.duncan-williams.com/  -try to include resistance (weight lifting/strength building) and balance exercises twice per week: or the following link for ideas: http://castillo-powell.com/  BuyDucts.dk  STRESS MANAGEMENT: -can try meditating,  or just sitting quietly with deep breathing while intentionally relaxing all parts of your body for 5 minutes daily -if you need further help with stress, anxiety or depression please follow up with your primary doctor or contact the wonderful folks at [facility]: [phone]  SOCIAL CONNECTIONS: -options in [place] if you wish to engage in more social and exercise related activities:  -Silver sneakers https://tools.silversneakers.com  -Walk with a Doc: http://www.duncan-williams.com/  -Check out the [facility] Active Adults 50+ section on the [place] of [employer] (hiking clubs, book clubs, cards and games, chess, exercise classes, aquatic classes and much more) - see the website for details: https://www.[place]-.gov/departments/parks-recreation/active-adults50  -YouTube has lots of exercise videos for different ages and abilities as well  -[facility] (a variety of indoor and outdoor inperson activities for adults). [phone]. [address]  -Virtual Online Classes (a variety of topics): see seniorplanet.org or call [phone]  -consider volunteering at a school, hospice center, church, senior center or  elsewhere   ADVANCED HEALTHCARE DIRECTIVES:  [place] Advanced Directives assistance:   ExpressWeek.com.cy  Everyone should have advanced health care directives in place. This is so that you get the care you want, should you ever be in a situation where you are unable to make your own medical decisions.   From the [place] Advanced Directive Website: "Advance Health Care Directives are legal documents in which you give written instructions about your health care if, in the future, you cannot speak for yourself.   A health care power of attorney allows you to name a person you trust to make your health care decisions if you cannot make them yourself. A declaration of a desire for a  natural death (or living will) is document, which states that you desire not to have your life prolonged by extraordinary measures if you have a terminal or incurable illness or if you are in a vegetative state. An advance instruction for mental health treatment makes a declaration of instructions, information and preferences regarding your mental health treatment. It also states that you are aware that the advance instruction authorizes a mental health treatment provider to act according to your wishes. It may also outline your consent or refusal of mental health treatment. A declaration of an anatomical gift allows anyone over the age of 57 to make a gift by will, organ donor card or other document."   Please see the following website or an elder law attorney for forms, FAQs and for completion of advanced directives: [place]  Print production planner Health Care Directives Advance Health Care Directives (http://guzman.com/)  Or copy and paste the following to your web browser: [email or web address]           Maurie Southern, DO

## 2023-07-11 NOTE — Patient Instructions (Addendum)
 I really enjoyed getting to talk with you today! I am available on Tuesdays and Thursdays for virtual visits if you have any questions or concerns, or if I can be of any further assistance.   CHECKLIST FROM ANNUAL WELLNESS VISIT:  -Follow up (please call to schedule if not scheduled after visit):   -yearly for annual wellness visit with primary care office  Here is a list of your preventive care/health maintenance measures and the plan for each if any are due:  PLAN For any measures below that may be due:  -we sent a referral for the colonoscopy, please call us  if you have not been contacted in the next 2 weeks -can get the vaccines at the pharmacy, please let us  know when you do so that we can update your record   Health Maintenance  Topic Date Due   Zoster Vaccines- Shingrix (1 of 2) 03/[birth year]/1964   DTaP/Tdap/Td (2 - Tdap) 02/28/2018   Colonoscopy  12/02/2019   COVID-19 Vaccine (4 - 2024-25 season) 07/27/2023 (Originally 10/30/2022)   INFLUENZA VACCINE  09/29/2023   Medicare Annual Wellness (AWV)  07/10/2024   Pneumonia Vaccine 21+ Years old  Completed   DEXA SCAN  Completed   Hepatitis C Screening  Completed   HPV VACCINES  Aged Out   Meningococcal B Vaccine  Aged Out    -See a dentist at least yearly  -Get your eyes checked and then per your eye specialist's recommendations  -I have included below further information regarding a healthy whole foods based diet, physical activity guidelines for adults, stress management and opportunities for social connections. I hope you find this information useful.   -----------------------------------------------------------------------------------------------------------------------------------------------------------------------------------------------------------------------------------------------------------    NUTRITION: -eat real food: lots of colorful vegetables (half the plate) and fruits -5-7 servings of vegetables and fruits per  day (fresh or steamed is best), exp. 2 servings of vegetables with lunch and dinner and 2 servings of fruit per day. Berries and greens such as kale and collards are great choices.  -consume on a regular basis:  fresh fruits, fresh veggies, fish, nuts, seeds, healthy oils (such as olive oil, avocado oil), whole grains (make sure for bread/pasta/crackers/etc., that the first ingredient on label contains the word "whole"), legumes. -can eat small amounts of dairy and lean meat (no larger than the palm of your hand), but avoid processed meats such as ham, bacon, lunch meat, etc. -drink water -try to avoid fast food and pre-packaged foods, processed meat, ultra processed foods/beverages (donuts, candy, etc.) -most experts advise limiting sodium to < 2300mg  per day, should limit further is any chronic conditions such as high blood pressure, heart disease, diabetes, etc. The American Heart Association advised that < 1500mg  is is ideal -try to avoid foods/beverages that contain any ingredients with names you do not recognize  -try to avoid foods/beverages  with added sugar or sweeteners/sweets  -try to avoid sweet drinks (including diet drinks): soda, juice, Gatorade, sweet tea, power drinks, diet drinks -try to avoid white rice, white bread, pasta (unless whole grain)  EXERCISE GUIDELINES FOR ADULTS: -if you wish to increase your physical activity, do so gradually and with the approval of your doctor -STOP and seek medical care immediately if you have any chest pain, chest discomfort or trouble breathing when starting or increasing exercise  -move and stretch your body, legs, feet and arms when sitting for long periods -Physical activity guidelines for optimal health in adults: -get at least 150 minutes per week of moderate exercise (can talk, but  not sing); this is about 20-30 minutes of sustained activity 5-7 days per week or two 10-15 minute episodes of sustained activity 5-7 days per week -do some  muscle building/resistance training/strength training at least 2 days per week  -balance exercises 3+ days per week:   Stand somewhere where you have something sturdy to hold onto if you lose balance    1) lift up on toes, then back down, start with 5x per day and work up to 20x   2) stand and lift one leg straight out to the side so that foot is a few inches of the floor, start with 5x each side and work up to 20x each side   3) stand on one foot, start with 5 seconds each side and work up to 20 seconds on each side  If you need ideas or help with getting more active:  -Silver sneakers https://tools.silversneakers.com  -Walk with a Doc: http://www.duncan-williams.com/  -try to include resistance (weight lifting/strength building) and balance exercises twice per week: or the following link for ideas: http://castillo-powell.com/  BuyDucts.dk  STRESS MANAGEMENT: -can try meditating, or just sitting quietly with deep breathing while intentionally relaxing all parts of your body for 5 minutes daily -if you need further help with stress, anxiety or depression please follow up with your primary doctor or contact the wonderful folks at [facility]: [phone]  SOCIAL CONNECTIONS: -options in [place] if you wish to engage in more social and exercise related activities:  -Silver sneakers https://tools.silversneakers.com  -Walk with a Doc: http://www.duncan-williams.com/  -Check out the [facility] Active Adults 50+ section on the [place] [employer] (hiking clubs, book clubs, cards and games, chess, exercise classes, aquatic classes and much more) - see the website for details: https://www.[place].gov/departments/parks-recreation/active-adults50  -YouTube has lots of exercise videos for different ages and abilities as well  -[facility] (a variety of indoor and outdoor  inperson activities for adults). [phone]. [address]  -Virtual Online Classes (a variety of topics): see seniorplanet.org or call [phone]  -consider volunteering at a school, hospice center, church, senior center or elsewhere   ADVANCED HEALTHCARE DIRECTIVES:  [place] Advanced Directives assistance:   ExpressWeek.com.cy  Everyone should have advanced health care directives in place. This is so that you get the care you want, should you ever be in a situation where you are unable to make your own medical decisions.   From the [place] Advanced Directive Website: "Advance Health Care Directives are legal documents in which you give written instructions about your health care if, in the future, you cannot speak for yourself.   A health care power of attorney allows you to name a person you trust to make your health care decisions if you cannot make them yourself. A declaration of a desire for a natural death (or living will) is document, which states that you desire not to have your life prolonged by extraordinary measures if you have a terminal or incurable illness or if you are in a vegetative state. An advance instruction for mental health treatment makes a declaration of instructions, information and preferences regarding your mental health treatment. It also states that you are aware that the advance instruction authorizes a mental health treatment provider to act according to your wishes. It may also outline your consent or refusal of mental health treatment. A declaration of an anatomical gift allows anyone over the age of 13 to make a gift by will, organ donor card or other document."   Please  see the following website or an elder law attorney for forms, FAQs and for completion of advanced directives: [place]  Print production planner Health Care Directives Advance Health Care Directives (http://guzman.com/)  Or copy and  paste the following to your web browser: PoshChat.fi

## 2023-07-13 ENCOUNTER — Telehealth: Payer: Self-pay | Admitting: Internal Medicine

## 2023-07-13 DIAGNOSIS — I10 Essential (primary) hypertension: Secondary | ICD-10-CM

## 2023-07-13 NOTE — Telephone Encounter (Unsigned)
 Copied from CRM [id number]. Topic: Clinical - Medication Refill >> Jul 13, 2023 12:19 PM Trula Gable C wrote: Medication: losartan  (COZAAR ) 50 MG tablet  Has the patient contacted their pharmacy? Yes (Agent: If no, request that the patient contact the pharmacy for the refill. If patient does not wish to contact the pharmacy document the reason why and proceed with request.) (Agent: If yes, when and what did the pharmacy advise?)  This is the patient's preferred pharmacy:  [id number] - [place], [place] - [address] AT [facility] OF [address] [place] [address] Phone: [phone] Fax: [phone]  Is this the correct pharmacy for this prescription? Yes If no, delete pharmacy and type the correct one.   Has the prescription been filled recently? No  Is the patient out of the medication? Yes  Has the patient been seen for an appointment in the last year OR does the patient have an upcoming appointment? Yes  Can we respond through MyChart? Yes  Agent: Please be advised that Rx refills may take up to 3 business days. We ask that you follow-up with your pharmacy.

## 2023-07-18 MED ORDER — LOSARTAN POTASSIUM 50 MG PO TABS: ORAL_TABLET | ORAL | 0 refills | Status: DC

## 2023-07-18 NOTE — Telephone Encounter (Signed)
Rx sent.   Attempted to reach pt. Left a voicemail to call us back.

## 2023-08-10 ENCOUNTER — Telehealth: Payer: Self-pay | Admitting: Family Medicine

## 2023-08-10 NOTE — Telephone Encounter (Signed)
 Called pt to check on referral for colonoscopy. LM for her to let us  know if scheduled or not. Can see in chart where referral was sent to [facility].

## 2023-08-10 NOTE — Telephone Encounter (Signed)
-----   Message from Maurie Southern sent at 07/11/2023 12:42 PM EDT ----- Check on colon ca screening and dexa

## 2023-08-11 DIAGNOSIS — H43813 Vitreous degeneration, bilateral: Secondary | ICD-10-CM | POA: Diagnosis not present

## 2023-08-11 DIAGNOSIS — H35433 Paving stone degeneration of retina, bilateral: Secondary | ICD-10-CM | POA: Diagnosis not present

## 2023-08-11 DIAGNOSIS — H353133 Nonexudative age-related macular degeneration, bilateral, advanced atrophic without subfoveal involvement: Secondary | ICD-10-CM | POA: Diagnosis not present

## 2023-08-11 DIAGNOSIS — H353221 Exudative age-related macular degeneration, left eye, with active choroidal neovascularization: Secondary | ICD-10-CM | POA: Diagnosis not present

## 2023-08-11 DIAGNOSIS — H35373 Puckering of macula, bilateral: Secondary | ICD-10-CM | POA: Diagnosis not present

## 2023-08-14 ENCOUNTER — Encounter: Payer: Self-pay | Admitting: Family Medicine

## 2023-08-14 ENCOUNTER — Ambulatory Visit (INDEPENDENT_AMBULATORY_CARE_PROVIDER_SITE_OTHER): Admitting: Family Medicine

## 2023-08-14 VITALS — BP 122/66 | HR 61 | Temp 98.7°F | Ht 64.0 in | Wt 149.4 lb

## 2023-08-14 DIAGNOSIS — R300 Dysuria: Secondary | ICD-10-CM

## 2023-08-14 DIAGNOSIS — R3989 Other symptoms and signs involving the genitourinary system: Secondary | ICD-10-CM

## 2023-08-14 LAB — POC URINALSYSI DIPSTICK (AUTOMATED)
Bilirubin, UA: NEGATIVE
Blood, UA: POSITIVE
Glucose, UA: NEGATIVE
Ketones, UA: NEGATIVE
Nitrite, UA: NEGATIVE
Protein, UA: NEGATIVE
Spec Grav, UA: 1.015 (ref 1.010–1.025)
Urobilinogen, UA: 0.2 U/dL
pH, UA: 6 (ref 5.0–8.0)

## 2023-08-14 MED ORDER — NITROFURANTOIN MONOHYD MACRO 100 MG PO CAPS: 100.0000 mg | ORAL_CAPSULE | Freq: Two times a day (BID) | ORAL | 0 refills | Status: AC

## 2023-08-14 NOTE — Progress Notes (Signed)
 Established Patient Office Visit   Subjective  Patient ID: Nicole Wood, female    DOB: March 22, 1945  Age: 78 y.o. MRN: [id number]  Chief Complaint  Patient presents with   Medical Management of Chronic Issues    Patient states possible UTI odorous urine since 6/13    Patient is a 78 year old female followed by Dr. Ethel Henry and seen for acute concern.  Patient urinary symptoms starting 4 days ago.  Woke up Friday with mild odorous urine.  Tried to increase fluid intake.  On Saturday developed mild irritation with urination and some back pain different from her typical history of degenerative disc disease.  Denies nausea, vomiting, constipation, fever, chills, suprapubic pain/pressure, pain that moves, hematuria, change in appetite or medications.    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   Past Medical History:  Diagnosis Date   Basal cell cancer    nose   Cystocele    Depression    in the past   Hematuria    idiopathic   Hypertension    Osteoarthritis    Osteopenia    dexa 2009, -2.3 osteoporosis -2.5 hip 2011   Urinary incontinence    Past Surgical History:  Procedure Laterality Date   ABDOMINAL HYSTERECTOMY     fibroids and cyst right ovary left oopherectomy   basal cell ca removed     bladder tack     BREAST BIOPSY Left    benign   rt shoulder surgery   2004   [place]   URETHRAL DILATION      Social History   Tobacco Use   Smoking status: Never   Smokeless tobacco: Never  Vaping Use   Vaping status: Never Used  Substance Use Topics   Alcohol use: Not Currently   Drug use: No   Family History  Problem Relation Age of Onset   Pulmonary embolism Mother    Depression Mother        major depressive disorder   Hip fracture Mother        about 42    Hypertension Mother    Cancer Father        bladder   Diabetes Brother    Hyperlipidemia Brother    Arthritis Brother        DJD   Arthritis Brother    Stroke Maternal Aunt    Breast cancer Maternal Aunt    Heart attack Neg Hx    Allergies  Allergen Reactions   Contrast Media [Iodinated Contrast Media] Other (See Comments)    Gums got numb after a procedure   Lisinopril  Cough    Ok on  losartan    Penicillins Rash    ROS Negative unless stated above    Objective:     BP 122/66 (BP Location: Left Arm, Patient Position: Sitting, Cuff Size: Normal)   Pulse 61   Temp 98.7 F (37.1 C) (Oral)   Ht 5' 4 (1.626 m)  Wt 149 lb 6.4 oz (67.8 kg)   LMP  (LMP Unknown)   SpO2 90%   BMI 25.64 kg/m  BP Readings from Last 3 Encounters:  08/14/23 122/66  02/01/23 120/60  07/13/22 (!) 140/78   Wt Readings from Last 3 Encounters:  08/14/23 149 lb 6.4 oz (67.8 kg)  02/01/23 146 lb 6.4 oz (66.4 kg)  07/13/22 141 lb (64 kg)      Physical Exam Constitutional:      General: She is not in acute distress.    Appearance: Normal appearance.  HENT:     Head: Normocephalic and atraumatic.     Nose: Nose normal.     Mouth/Throat:     Mouth: Mucous membranes are moist.   Cardiovascular:     Rate and Rhythm: Normal rate and regular rhythm.     Heart sounds: Normal heart sounds. No murmur heard.    No gallop.  Pulmonary:     Effort: Pulmonary effort is normal. No respiratory distress.     Breath sounds: Normal breath sounds. No wheezing, rhonchi or rales.  Abdominal:     General: Bowel sounds are normal.      Palpations: Abdomen is soft.     Tenderness: There is no abdominal tenderness. There is no right CVA tenderness, left CVA tenderness, guarding or rebound.   Skin:    General: Skin is warm and dry.   Neurological:     Mental Status: She is alert and oriented to person, place, and time.        07/11/2023   11:11 AM 05/16/2022    9:22 AM 05/03/2021   10:46 AM  Depression screen PHQ 2/9  Decreased Interest 0 0 0  Down, Depressed, Hopeless 0 0 0  PHQ - 2 Score 0 0 0  Altered sleeping 0    Tired, decreased energy 0    Change in appetite 0    Feeling bad or failure about yourself  0    Trouble concentrating 0    Moving slowly or fidgety/restless 0    PHQ-9 Score 0        07/11/2023   11:14 AM 04/09/2015   12:28 PM  GAD 7 : Generalized Anxiety Score  Nervous, Anxious, on Edge 0 1  Control/stop worrying 0 1  Worry too much - different things 0 1  Trouble relaxing 0 1  Restless 0   Easily annoyed or irritable 0 0  Afraid - awful might happen 0 0  Total GAD 7 Score 0   Anxiety Difficulty  Not difficult at all     Results for orders placed or performed in visit on 08/14/23  POCT Urinalysis Dipstick (Automated)  Result Value Ref Range   Color, UA yellow    Clarity, UA clear    Glucose, UA Negative Negative   Bilirubin, UA neg    Ketones, UA neg    Spec Grav, UA 1.015 1.010 - 1.025   Blood, UA pos    pH, UA 6.0 5.0 - 8.0   Protein, UA Negative Negative   Urobilinogen, UA 0.2 0.2 or 1.0 E.U./dL   Nitrite, UA neg    Leukocytes, UA Small (1+) (A) Negative      Assessment & Plan:   Suspected UTI -     Nitrofurantoin Monohyd Macro; Take 1 capsule (100 mg total) by mouth 2 (two) times daily for 7 days.  Dispense: 14 capsule; Refill: 0  Dysuria -     POCT Urinalysis Dipstick (Automated) -  Urine Culture; Future -     Nitrofurantoin Monohyd Macro; Take 1 capsule (100 mg total) by mouth 2 (two) times daily for 7 days.  Dispense: 14 capsule; Refill: 0  Symptoms concerning  for UTI.  POC UA with 1+ leuks and blood.  Will obtain UCX.  Start ABX while awaiting culture results.  Allergy to penicillin-rash.  Will change or stop medication if needed based on results.  Patient to continue increasing p.o. intake of fluids.  Given strict precautions.  Return if symptoms worsen or fail to improve.   Viola Greulich, MD

## 2023-08-15 LAB — URINE CULTURE
MICRO NUMBER:: 16585610
SPECIMEN QUALITY:: ADEQUATE

## 2023-08-16 ENCOUNTER — Ambulatory Visit: Payer: Self-pay | Admitting: Family Medicine

## 2023-08-24 ENCOUNTER — Telehealth: Payer: Self-pay | Admitting: Family Medicine

## 2023-08-24 NOTE — Telephone Encounter (Signed)
 5/13 AWV, colonoscopy ordered 6/12 LM to check on status and advise to schedule, appears GI had LMs for pt to call to schedule 6/26 called to check on status. LM asking her to let us  know status, provided number for GI office to call to schedule and advised also to let us  know if she needs anything further to assist in scheduling.

## 2023-08-24 NOTE — Telephone Encounter (Signed)
-----   Message from [name] sent at 08/10/2023 11:59 AM EDT ----- Regarding: check on colon ca screening. Referral sent in may, checked/LM 6/12. ----- Message ----- From: Cramp Chiquita JONELLE, DO Sent: 08/08/2023  12:00 AM EDT To: [name], DO  Check on colon ca screening and dexa

## 2023-08-30 ENCOUNTER — Other Ambulatory Visit: Payer: Self-pay | Admitting: Family

## 2023-08-30 DIAGNOSIS — H353221 Exudative age-related macular degeneration, left eye, with active choroidal neovascularization: Secondary | ICD-10-CM | POA: Diagnosis not present

## 2023-08-30 DIAGNOSIS — F411 Generalized anxiety disorder: Secondary | ICD-10-CM

## 2023-09-05 ENCOUNTER — Telehealth: Payer: Self-pay | Admitting: Family

## 2023-09-05 ENCOUNTER — Other Ambulatory Visit: Payer: Self-pay | Admitting: Family

## 2023-09-05 DIAGNOSIS — F411 Generalized anxiety disorder: Secondary | ICD-10-CM

## 2023-09-05 NOTE — Telephone Encounter (Unsigned)
 Copied from CRM [id number]. Topic: Clinical - Medication Question >> Sep 05, 2023 10:56 AM Ernestene P wrote: Reason for CRM: Pt would like to know if she could get a short term supply of citalopram  (CELEXA ) 10 MG tablet as she will be leaving out of town Friday and do not want to be without her medicine. Pt can be reached [phone]

## 2023-09-05 NOTE — Telephone Encounter (Unsigned)
 Copied from CRM [id number]. Topic: Clinical - Medication Refill >> Sep 05, 2023 10:53 AM Ernestene P wrote: Medication: citalopram  (CELEXA ) 10 MG tablet   Has the patient contacted their pharmacy? Yes (Agent: If no, request that the patient contact the pharmacy for the refill. If patient does not wish to contact the pharmacy document the reason why and proceed with request.) (Agent: If yes, when and what did the pharmacy advise?)  This is the patient's preferred pharmacy:  [id number] - [place], [place] - [address] AT [facility] OF [address] & [address] [place] [address] Phone: [phone] Fax: [phone]  Is this the correct pharmacy for this prescription? Yes If no, delete pharmacy and type the correct one.   Has the prescription been filled recently? No  Is the patient out of the medication? Yes- has 1 pill left / will be leaving out of town on friday  Has the patient been seen for an appointment in the last year OR does the patient have an upcoming appointment? Yes  Can we respond through MyChart? Yes  Agent: Please be advised that Rx refills may take up to 3 business days. We ask that you follow-up with your pharmacy.

## 2023-09-06 ENCOUNTER — Encounter: Payer: Self-pay | Admitting: Internal Medicine

## 2023-09-06 ENCOUNTER — Ambulatory Visit (INDEPENDENT_AMBULATORY_CARE_PROVIDER_SITE_OTHER): Admitting: Internal Medicine

## 2023-09-06 VITALS — BP 100/56 | HR 66 | Temp 98.1°F | Ht 64.0 in | Wt 148.4 lb

## 2023-09-06 DIAGNOSIS — F411 Generalized anxiety disorder: Secondary | ICD-10-CM

## 2023-09-06 DIAGNOSIS — I10 Essential (primary) hypertension: Secondary | ICD-10-CM

## 2023-09-06 DIAGNOSIS — Z79899 Other long term (current) drug therapy: Secondary | ICD-10-CM

## 2023-09-06 MED ORDER — CITALOPRAM HYDROBROMIDE 10 MG PO TABS: ORAL_TABLET | ORAL | 1 refills | Status: DC

## 2023-09-06 NOTE — Patient Instructions (Addendum)
 Good to see you today .  Updating lab monitoring .  Refill meds 90 days today .  6 month or Yearly check  or as needed.

## 2023-09-06 NOTE — Progress Notes (Signed)
 Chief Complaint  Patient presents with   Medical Management of Chronic Issues    HPI: Nicole Wood 78 y.o. come in for Chronic disease management   lasat visit by me 5 24 Has had fu related appts  cards and AWV.  HT palpitations  :yearly check last  12 24  is stable and  controlled  Anxiety: Citalopram    has been helpful  taking 10 mg x 2 per day current ly taking both at night but option to take bid  Citalopram  tried   when sleep was difficult  and then got better .   Had cataract surgery and now sees retinal specialist   on injections.left more than right.   Dr Nonah ROS: See pertinent positives and negatives per HPI.  Past Medical History:  Diagnosis Date   Basal cell cancer    nose   Cystocele    Depression    in the past   Hematuria    idiopathic   Hypertension    Osteoarthritis    Osteopenia    dexa 2009, -2.3 osteoporosis -2.5 hip 2011   Urinary incontinence     Family History  Problem Relation Age of Onset   Pulmonary embolism Mother    Depression Mother        major depressive disorder   Hip fracture Mother        about 62    Hypertension Mother    Cancer Father        bladder   Diabetes Brother    Hyperlipidemia Brother    Arthritis Brother        DJD   Arthritis Brother    Stroke Maternal Aunt    Breast cancer Maternal Aunt    Heart attack Neg Hx     Social History   Socioeconomic History   Marital status: Married    Spouse name: Not on file   Number of children: Not on file   Years of education: Not on file   Highest education level: Not on file  Occupational History   Not on file  Tobacco Use   Smoking status: Never   Smokeless tobacco: Never  Vaping Use   Vaping status: Never Used  Substance and Sexual Activity   Alcohol use: Not Currently   Drug use: No   Sexual activity: Yes    Partners: Female  Other Topics Concern   Not on file  Social History Narrative   Retired Diplomatic Services operational officer) some college   Married 4 children [place]  [place], and [place], and GC Husband cancer MM. NHL in remission   Regular exercise- yes   HH of 2   No pets    Multiple myeloma and non hogkins lymphoma in husband x 6 years   Regular exercise: walk, swim, some biking   Caffeine use: coffee daily, sweet tea   Social Drivers of Corporate investment banker Strain: Low Risk  (05/16/2022)   Overall Financial Resource Strain (CARDIA)    Difficulty of Paying Living Expenses: Not hard at all  Food Insecurity: No Food Insecurity (05/16/2022)   Hunger Vital Sign    Worried About Running Out of Food in the Last Year: Never true    Ran Out of Food in the Last Year: Never true  Transportation Needs: No Transportation Needs (05/16/2022)   PRAPARE - Administrator, Civil Service (Medical): No    Lack of Transportation (Non-Medical): No  Physical Activity: Insufficiently Active (05/16/2022)   Exercise  Vital Sign    Days of Exercise per Week: 3 days    Minutes of Exercise per Session: 30 min  Stress: No Stress Concern Present (05/16/2022)   [employer] of Occupational Health - Occupational Stress Questionnaire    Feeling of Stress : Not at all  Social Connections: Socially Integrated (05/16/2022)   Social Connection and Isolation Panel    Frequency of Communication with Friends and Family: More than three times a week    Frequency of Social Gatherings with Friends and Family: More than three times a week    Attends Religious Services: More than 4 times per year    Active Member of [employer] or Organizations: Yes    Attends Engineer, structural: More than 4 times per year    Marital Status: Married    Outpatient Medications Prior to Visit  Medication Sig Dispense Refill   bifidobacterium infantis (ALIGN) capsule Take 1 capsule by mouth daily.     Cholecalciferol (VITAMIN D3) 5000 units CAPS Take 1 capsule by mouth daily.     LORazepam  (ATIVAN ) 0.5 MG tablet TAKE 1 TABLET(0.5 MG) BY MOUTH EVERY 8 HOURS AS NEEDED 24  tablet 0   losartan  (COZAAR ) 50 MG tablet TAKE 1 TABLET(50 MG) BY MOUTH DAILY 90 tablet 0   metoprolol  succinate (TOPROL -XL) 50 MG 24 hr tablet Take 1 tablet (50 mg total) by mouth daily. 90 tablet 3   Multiple Vitamins-Minerals (ICAPS PO) Take 1 capsule by mouth daily.     naproxen sodium (ANAPROX) 220 MG tablet Take 220 mg by mouth 2 (two) times daily as needed (pain). ALEVE     polyethylene glycol (MIRALAX / GLYCOLAX) packet Take 8.5 g by mouth daily as needed for mild constipation or moderate constipation.     citalopram  (CELEXA ) 10 MG tablet TAKE 1 TABLET(10 MG) BY MOUTH TWICE DAILY 180 tablet 1   No facility-administered medications prior to visit.     EXAM:  BP (!) 100/56 (BP Location: Left Arm, Patient Position: Sitting, Cuff Size: Normal)   Pulse 66   Temp 98.1 F (36.7 C) (Oral)   Ht 5' 4 (1.626 m)   Wt 148 lb 6.4 oz (67.3 kg)   LMP  (LMP Unknown)   SpO2 96%   BMI 25.47 kg/m   Body mass index is 25.47 kg/m.  GENERAL: vitals reviewed and listed above, alert, oriented, appears well hydrated and in no acute distress HEENT: atraumatic, conjunctiva  clear, no obvious abnormalities on inspection of external nose and ears  NECK: no obvious masses on inspection palpation  LUNGS: clear to auscultation bilaterally, no wheezes, rales or rhonchi, good air movement CV: HRRR, no clubbing cyanosis or  peripheral edema nl cap refill  MS: moves all extremities without noticeable focal  abnormality PSYCH: pleasant and cooperative, no obvious depression or anxiety Lab Results  Component Value Date   WBC 6.2 07/13/2022   HGB 12.3 07/13/2022   HCT 36.8 07/13/2022   PLT 287.0 07/13/2022   GLUCOSE 87 07/13/2022   CHOL 194 07/13/2022   TRIG 136.0 07/13/2022   HDL 42.50 07/13/2022   LDLDIRECT 137.2 12/14/2010   LDLCALC 124 (H) 07/13/2022   ALT 13 07/13/2022   AST 18 07/13/2022   NA 139 07/13/2022   K 4.4 07/13/2022   CL 104 07/13/2022   CREATININE 0.77 07/13/2022   BUN 16  07/13/2022   CO2 29 07/13/2022   TSH 2.27 07/13/2022   HGBA1C 5.6 01/14/2015   BP Readings from Last 3 Encounters:  09/06/23 (!) 100/56  08/14/23 122/66  02/01/23 120/60    ASSESSMENT AND PLAN:  Discussed the following assessment and plan:  Medication management - Plan: Basic metabolic panel with GFR, Hepatic function panel  Primary hypertension - Plan: Basic metabolic panel with GFR, Hepatic function panel  Anxiety state - Plan: Basic metabolic panel with GFR, Hepatic function panel, citalopram  (CELEXA ) 10 MG tablet Suggest updated labs  to plan  Med doing well at t his time  Refill for 6 mos  plan fu 6-12 months or as needed.  Will continue with retinal disease management .   -Patient advised to return or notify health care team  if  new concerns arise.  Patient Instructions  Good to see you today .  Updating lab monitoring .  Refill meds 90 days today .  6 month or Yearly check  or as needed.    Trenae Brunke K. Neda Willenbring M.D.

## 2023-09-07 LAB — BASIC METABOLIC PANEL WITH GFR
BUN: 17 mg/dL (ref 6–23)
CO2: 31 meq/L (ref 19–32)
Calcium: 9.4 mg/dL (ref 8.4–10.5)
Chloride: 101 meq/L (ref 96–112)
Creatinine, Ser: 0.85 mg/dL (ref 0.40–1.20)
GFR: 65.58 mL/min (ref >60.00)
Glucose, Bld: 140 mg/dL — ABNORMAL HIGH (ref 70–99)
Potassium: 4.1 meq/L (ref 3.5–5.1)
Sodium: 138 meq/L (ref 135–145)

## 2023-09-07 LAB — HEPATIC FUNCTION PANEL
ALT: 12 U/L (ref 0–35)
AST: 16 U/L (ref 0–37)
Albumin: 4.3 g/dL (ref 3.5–5.2)
Alkaline Phosphatase: 77 U/L (ref 39–117)
Bilirubin, Direct: 0.1 mg/dL (ref 0.0–0.3)
Total Bilirubin: 0.3 mg/dL (ref 0.2–1.2)
Total Protein: 6.8 g/dL (ref 6.0–8.3)

## 2023-09-09 ENCOUNTER — Ambulatory Visit: Payer: Self-pay | Admitting: Internal Medicine

## 2023-09-09 NOTE — Progress Notes (Signed)
 Chemistry normal except blood sugar  elevated but this could be  glucose intolerance   handled by diet changes  not diabetic  if not fasting .     In next few months plan / arrange fasting blood sugar and hg A1c when convenient .

## 2023-09-29 DIAGNOSIS — H35433 Paving stone degeneration of retina, bilateral: Secondary | ICD-10-CM | POA: Diagnosis not present

## 2023-09-29 DIAGNOSIS — H353221 Exudative age-related macular degeneration, left eye, with active choroidal neovascularization: Secondary | ICD-10-CM | POA: Diagnosis not present

## 2023-09-29 DIAGNOSIS — H43813 Vitreous degeneration, bilateral: Secondary | ICD-10-CM | POA: Diagnosis not present

## 2023-09-29 DIAGNOSIS — H35373 Puckering of macula, bilateral: Secondary | ICD-10-CM | POA: Diagnosis not present

## 2023-09-29 DIAGNOSIS — H353133 Nonexudative age-related macular degeneration, bilateral, advanced atrophic without subfoveal involvement: Secondary | ICD-10-CM | POA: Diagnosis not present

## 2023-10-02 ENCOUNTER — Other Ambulatory Visit: Payer: Self-pay | Admitting: Internal Medicine

## 2023-10-02 DIAGNOSIS — I10 Essential (primary) hypertension: Secondary | ICD-10-CM

## 2023-11-02 DIAGNOSIS — H353221 Exudative age-related macular degeneration, left eye, with active choroidal neovascularization: Secondary | ICD-10-CM | POA: Diagnosis not present

## 2023-11-02 DIAGNOSIS — H353113 Nonexudative age-related macular degeneration, right eye, advanced atrophic without subfoveal involvement: Secondary | ICD-10-CM | POA: Diagnosis not present

## 2023-12-14 ENCOUNTER — Ambulatory Visit: Admitting: Internal Medicine

## 2023-12-14 VITALS — BP 134/72 | HR 65 | Temp 98.1°F | Wt 143.4 lb

## 2023-12-14 DIAGNOSIS — Z79899 Other long term (current) drug therapy: Secondary | ICD-10-CM | POA: Diagnosis not present

## 2023-12-14 DIAGNOSIS — Z8616 Personal history of COVID-19: Secondary | ICD-10-CM | POA: Diagnosis not present

## 2023-12-14 DIAGNOSIS — H6120 Impacted cerumen, unspecified ear: Secondary | ICD-10-CM

## 2023-12-14 DIAGNOSIS — F411 Generalized anxiety disorder: Secondary | ICD-10-CM

## 2023-12-14 DIAGNOSIS — Z23 Encounter for immunization: Secondary | ICD-10-CM

## 2023-12-14 DIAGNOSIS — R739 Hyperglycemia, unspecified: Secondary | ICD-10-CM | POA: Diagnosis not present

## 2023-12-14 LAB — POCT GLYCOSYLATED HEMOGLOBIN (HGB A1C): Hemoglobin A1C: 5.5 % (ref 4.0–5.6)

## 2023-12-14 NOTE — Progress Notes (Signed)
 Chief Complaint  Patient presents with   Medical Management of Chronic Issues    Discuss lab result on high blood sugar    HPI: Nicole Wood 78 y.o. come in for Chronic disease management   See above  Fu BG   was elevated  PP  but wants to make sure ok ,Mom had age onset  controlled with diet.  Maternal Aunts had dm Ear hearing dec  used to se dr Ethyl who retired    small canals  Macular  degeneration and getting injection wet and dry.  Covid infection sept 15  fever  HA and myalgias  and anorexia  .  Dizzy  cough near end . But head congesiton and ear congestion .  Lost 6 # during  getting better  . Rare use of ativan   [place] transient palpitiations  sees cards yearly . No cp sob    ROS: See pertinent positives and negatives per HPI.  Past Medical History:  Diagnosis Date   Basal cell cancer    nose   Cystocele    Depression    in the past   Hematuria    idiopathic   Hypertension    Osteoarthritis    Osteopenia    dexa 2009, -2.3 osteoporosis -2.5 hip 2011   Urinary incontinence     Family History  Problem Relation Age of Onset   Pulmonary embolism Mother    Depression Mother        major depressive disorder   Hip fracture Mother        about 88    Hypertension Mother    Cancer Father        bladder   Diabetes Brother    Hyperlipidemia Brother    Arthritis Brother        DJD   Arthritis Brother    Stroke Maternal Aunt    Breast cancer Maternal Aunt    Heart attack Neg Hx     Social History   Socioeconomic History   Marital status: Married    Spouse name: Not on file   Number of children: Not on file   Years of education: Not on file   Highest education level: Not on file  Occupational History   Not on file  Tobacco Use   Smoking status: Never   Smokeless tobacco: Never  Vaping Use   Vaping status: Never Used  Substance and Sexual Activity   Alcohol use: Not Currently   Drug use: No   Sexual activity: Yes    Partners: Female  Other  Topics Concern   Not on file  Social History Narrative   Retired Diplomatic Services operational officer) some college   Married 4 children [place],, [place], and [place], and GC Husband cancer MM. NHL in remission   Regular exercise- yes   HH of 2   No pets    Multiple myeloma and non hogkins lymphoma in husband x 6 years   Regular exercise: walk, swim, some biking   Caffeine use: coffee daily, sweet tea   Social Drivers of Corporate investment banker Strain: Low Risk  (05/16/2022)   Overall Financial Resource Strain (CARDIA)    Difficulty of Paying Living Expenses: Not hard at all  Food Insecurity: No Food Insecurity (05/16/2022)   Hunger Vital Sign    Worried About Running Out of Food in the Last Year: Never true    Ran Out of Food in the Last Year: Never true  Transportation Needs: No Transportation  Needs (05/16/2022)   PRAPARE - Administrator, Civil Service (Medical): No    Lack of Transportation (Non-Medical): No  Physical Activity: Insufficiently Active (05/16/2022)   Exercise Vital Sign    Days of Exercise per Week: 3 days    Minutes of Exercise per Session: 30 min  Stress: No Stress Concern Present (05/16/2022)   [employer] of Occupational Health - Occupational Stress Questionnaire    Feeling of Stress : Not at all  Social Connections: Socially Integrated (05/16/2022)   Social Connection and Isolation Panel    Frequency of Communication with Friends and Family: More than three times a week    Frequency of Social Gatherings with Friends and Family: More than three times a week    Attends Religious Services: More than 4 times per year    Active Member of [employer] or Organizations: Yes    Attends Engineer, structural: More than 4 times per year    Marital Status: Married    Outpatient Medications Prior to Visit  Medication Sig Dispense Refill   citalopram  (CELEXA ) 10 MG tablet TAKE 1 TABLET(10 MG) BY MOUTH TWICE DAILY 180 tablet 1   losartan  (COZAAR ) 50 MG tablet TAKE 1  TABLET(50 MG) BY MOUTH DAILY 90 tablet 0   bifidobacterium infantis (ALIGN) capsule Take 1 capsule by mouth daily.     Cholecalciferol (VITAMIN D3) 5000 units CAPS Take 1 capsule by mouth daily.     LORazepam  (ATIVAN ) 0.5 MG tablet TAKE 1 TABLET(0.5 MG) BY MOUTH EVERY 8 HOURS AS NEEDED 24 tablet 0   metoprolol  succinate (TOPROL -XL) 50 MG 24 hr tablet Take 1 tablet (50 mg total) by mouth daily. 90 tablet 3   Multiple Vitamins-Minerals (ICAPS PO) Take 1 capsule by mouth daily.     naproxen sodium (ANAPROX) 220 MG tablet Take 220 mg by mouth 2 (two) times daily as needed (pain). ALEVE     polyethylene glycol (MIRALAX / GLYCOLAX) packet Take 8.5 g by mouth daily as needed for mild constipation or moderate constipation.     No facility-administered medications prior to visit.     EXAM:  BP 134/72 (Cuff Size: Normal)   Pulse 65   Temp 98.1 F (36.7 C) (Oral)   Wt 143 lb 6.4 oz (65 kg)   LMP  (LMP Unknown)   SpO2 98%   BMI 24.61 kg/m   Body mass index is 24.61 kg/m.  GENERAL: vitals reviewed and listed above, alert, oriented, appears well hydrated and in no acute distress HEENT: atraumatic, conjunctiva  clear, no obvious abnormalities on inspection of external nose and ears  eacs + wax  rihgt irrigated and nl tm  left still with wax  brown  had some discomfort so stopped OP : no lesion edema or exudate  NECK: no obvious masses on inspection palpation  LUNGS: clear to auscultation bilaterally, no wheezes, rales or rhonchi, good air movement CV: HRRR, no clubbing cyanosis or  peripheral edema nl cap refill  MS: moves all extremities without noticeable focal  abnormality PSYCH: pleasant and cooperative,  Lab Results  Component Value Date   WBC 6.2 07/13/2022   HGB 12.3 07/13/2022   HCT 36.8 07/13/2022   PLT 287.0 07/13/2022   GLUCOSE 140 (H) 09/06/2023   CHOL 194 07/13/2022   TRIG 136.0 07/13/2022   HDL 42.50 07/13/2022   LDLDIRECT 137.2 12/14/2010   LDLCALC 124 (H) 07/13/2022    ALT 12 09/06/2023   AST 16 09/06/2023   NA 138  09/06/2023   K 4.1 09/06/2023   CL 101 09/06/2023   CREATININE 0.85 09/06/2023   BUN 17 09/06/2023   CO2 31 09/06/2023   TSH 2.27 07/13/2022   HGBA1C 5.5 12/14/2023   BP Readings from Last 3 Encounters:  12/14/23 134/72  09/06/23 (!) 100/56  08/14/23 122/66    ASSESSMENT AND PLAN:  Discussed the following assessment and plan:  Hyperglycemia - nl a1c   follow - Plan: POC HgB A1c  Medication management  Wax in ear - irrigated   right  hears better  residual in left try drops at night and return for wax removal attempt  if not sucessful get ent to help  Anxiety state  History of COVID-19 - recovering  no  alarm sx  Influenza vaccine needed - Plan: Flu vaccine HIGH DOSE PF(Fluzone Trivalent) Limited use of ativan   less than 1 every 2 weeks has a few left can call for refill   Avoid  reg use  avoid rebound  If palpitations  in frequency then get back with cards for opinion.  Checking A1c today  last bg was post prandial   Bp adequate  No alarm sx residual  covid but still may be tired and had ur congestion  32 minutes review counsel document ear wax eval and removal plan  for fu -Patient advised to return or notify health care team  if  new concerns arise.  Patient Instructions  Good to see you today  Exam is good except  wax in ears  no obv infection A1c :    Bailie Christenbury K. Canden Cieslinski M.D.

## 2023-12-14 NOTE — Patient Instructions (Signed)
 Good to see you today  Exam is good except  wax in ears  no obv infection A1c :

## 2023-12-15 ENCOUNTER — Encounter: Payer: Self-pay | Admitting: Internal Medicine

## 2023-12-28 DIAGNOSIS — H353221 Exudative age-related macular degeneration, left eye, with active choroidal neovascularization: Secondary | ICD-10-CM | POA: Diagnosis not present

## 2023-12-28 DIAGNOSIS — H43813 Vitreous degeneration, bilateral: Secondary | ICD-10-CM | POA: Diagnosis not present

## 2023-12-28 DIAGNOSIS — H353133 Nonexudative age-related macular degeneration, bilateral, advanced atrophic without subfoveal involvement: Secondary | ICD-10-CM | POA: Diagnosis not present

## 2023-12-28 DIAGNOSIS — H353113 Nonexudative age-related macular degeneration, right eye, advanced atrophic without subfoveal involvement: Secondary | ICD-10-CM | POA: Diagnosis not present

## 2023-12-28 DIAGNOSIS — H35373 Puckering of macula, bilateral: Secondary | ICD-10-CM | POA: Diagnosis not present

## 2023-12-28 DIAGNOSIS — H35433 Paving stone degeneration of retina, bilateral: Secondary | ICD-10-CM | POA: Diagnosis not present

## 2024-01-02 ENCOUNTER — Other Ambulatory Visit: Payer: Self-pay | Admitting: Internal Medicine

## 2024-01-02 DIAGNOSIS — I10 Essential (primary) hypertension: Secondary | ICD-10-CM

## 2024-02-06 ENCOUNTER — Other Ambulatory Visit: Payer: Self-pay | Admitting: Physician Assistant

## 2024-03-03 ENCOUNTER — Other Ambulatory Visit: Payer: Self-pay | Admitting: Internal Medicine

## 2024-03-03 DIAGNOSIS — F411 Generalized anxiety disorder: Secondary | ICD-10-CM

## 2024-04-03 ENCOUNTER — Other Ambulatory Visit: Payer: Self-pay | Admitting: Internal Medicine

## 2024-04-03 DIAGNOSIS — I10 Essential (primary) hypertension: Secondary | ICD-10-CM
